# Patient Record
Sex: Female | Born: 1985 | Race: Black or African American | Hispanic: No | Marital: Married | Smoking: Never smoker
Health system: Southern US, Community
[De-identification: ages and names within clinical notes are randomized; demographics above are authoritative.]

## PROBLEM LIST (undated history)

## (undated) DIAGNOSIS — Z789 Other specified health status: Secondary | ICD-10-CM

## (undated) HISTORY — PX: NO PAST SURGERIES: SHX2092

## (undated) HISTORY — DX: Other specified health status: Z78.9

---

## 2013-04-10 NOTE — L&D Delivery Note (Signed)
Delivery Note At 3:39 AM a viable and healthy female was delivered via Vaginal, Spontaneous Delivery (Presentation: Right Occiput Anterior).  APGAR: 6, 9; weight: pending  .   Placenta status: Intact, Spontaneous.  Cord: 3 vessels with the following complications: None.  Cord pH: pending  Anesthesia: Epidural  Episiotomy: None Lacerations: 2nd degree Suture Repair: 3.0 vicryl Est. Blood Loss (mL): 350  Mom to postpartum.  Baby to Couplet care / Skin to Skin.  Pt pushed with good maternal effort to deliver a liveborn female via NSVD with spontaneous cry.   Loose nuchal x1 reduced prior to delivery.  Baby placed on maternal abdomen.  Cord clamped and cut immediately by provider.  Infant taken to warmer for further assessment.  Placenta delivered intact with 3V cord via traction and pitocin.  2nd degree tear. No complications.  Baby returned to mother after assessment and suctioning.  Mom and baby to postpartum.   [name] 09/17/2013, 4:17 AM  I was present for this delivery agree with the above resident's note.  LEFTWICH-KIRBY, [place] Certified Nurse-Midwife

## 2013-07-03 ENCOUNTER — Encounter: Payer: Self-pay | Admitting: Obstetrics & Gynecology

## 2013-07-03 ENCOUNTER — Ambulatory Visit (INDEPENDENT_AMBULATORY_CARE_PROVIDER_SITE_OTHER): Payer: Self-pay | Admitting: Obstetrics & Gynecology

## 2013-07-03 VITALS — BP 114/70 | Temp 98.2°F | Ht 65.0 in | Wt 174.6 lb

## 2013-07-03 DIAGNOSIS — Z3400 Encounter for supervision of normal first pregnancy, unspecified trimester: Secondary | ICD-10-CM

## 2013-07-03 LAB — CBC
HCT: 33.6 % — ABNORMAL LOW (ref 36.0–46.0)
HEMOGLOBIN: 11.5 g/dL — AB (ref 12.0–15.0)
MCH: 29.9 pg (ref 26.0–34.0)
MCHC: 34.2 g/dL (ref 30.0–36.0)
MCV: 87.3 fL (ref 78.0–100.0)
Platelets: 220 10*3/uL (ref 150–400)
RBC: 3.85 MIL/uL — AB (ref 3.87–5.11)
RDW: 14.7 % (ref 11.5–15.5)
WBC: 7.5 10*3/uL (ref 4.0–10.5)

## 2013-07-03 LAB — POCT URINALYSIS DIP (DEVICE)
BILIRUBIN URINE: NEGATIVE
GLUCOSE, UA: NEGATIVE mg/dL
Ketones, ur: NEGATIVE mg/dL
Leukocytes, UA: NEGATIVE
NITRITE: NEGATIVE
Protein, ur: NEGATIVE mg/dL
Specific Gravity, Urine: 1.01 (ref 1.005–1.030)
Urobilinogen, UA: 0.2 mg/dL (ref 0.0–1.0)
pH: 6 (ref 5.0–8.0)

## 2013-07-03 LAB — HIV ANTIBODY (ROUTINE TESTING W REFLEX): HIV: NONREACTIVE

## 2013-07-03 LAB — OB RESULTS CONSOLE GC/CHLAMYDIA
CHLAMYDIA, DNA PROBE: NEGATIVE
GC PROBE AMP, GENITAL: NEGATIVE

## 2013-07-03 LAB — RPR

## 2013-07-03 MED ORDER — TETANUS-DIPHTH-ACELL PERTUSSIS 5-2.5-18.5 LF-MCG/0.5 IM SUSP: 0.5000 mL | Freq: Once | INTRAMUSCULAR | Status: DC

## 2013-07-03 NOTE — Progress Notes (Signed)
Nutrition note: 1st visit consult Pt has gained 31.6# @ [redacted]w[redacted]d, which is > expected. Pt reports eating 2 meals & 2 snacks/d. Pt is taking a PNV. Pt reports having some heartburn but no N/V. Pt received verbal & written education on general nutrition during pregnancy.  Discussed wt gain goals of 25-35# or 1#/wk. Pt agrees to continue taking PNV. Pt does not have [employer] but plans to apply. Pt plans to BF. F/u as needed Vladimir Faster, MS, RD, LDN, [facility]

## 2013-07-03 NOTE — Patient Instructions (Signed)
Third Trimester of Pregnancy The third trimester is from week 29 through week 42, months 7 through 9. The third trimester is a time when the fetus is growing rapidly. At the end of the ninth month, the fetus is about 20 inches in length and weighs 6 10 pounds.  BODY CHANGES Your body goes through many changes during pregnancy. The changes vary from woman to woman.   Your weight will continue to increase. You can expect to gain 25 35 pounds (11 16 kg) by the end of the pregnancy.  You may begin to get stretch marks on your hips, abdomen, and breasts.  You may urinate more often because the fetus is moving lower into your pelvis and pressing on your bladder.  You may develop or continue to have heartburn as a result of your pregnancy.  You may develop constipation because certain hormones are causing the muscles that push waste through your intestines to slow down.  You may develop hemorrhoids or swollen, bulging veins (varicose veins).  You may have pelvic pain because of the weight gain and pregnancy hormones relaxing your joints between the bones in your pelvis. Back aches may result from over exertion of the muscles supporting your posture.  Your breasts will continue to grow and be tender. A yellow discharge may leak from your breasts called colostrum.  Your belly button may stick out.  You may feel short of breath because of your expanding uterus.  You may notice the fetus "dropping," or moving lower in your abdomen.  You may have a bloody mucus discharge. This usually occurs a few days to a week before labor begins.  Your cervix becomes thin and soft (effaced) near your due date. WHAT TO EXPECT AT YOUR PRENATAL EXAMS  You will have prenatal exams every 2 weeks until week 36. Then, you will have weekly prenatal exams. During a routine prenatal visit:  You will be weighed to make sure you and the fetus are growing normally.  Your blood pressure is taken.  Your abdomen will be  measured to track your baby's growth.  The fetal heartbeat will be listened to.  Any test results from the previous visit will be discussed.  You may have a cervical check near your due date to see if you have effaced. At around 36 weeks, your caregiver will check your cervix. At the same time, your caregiver will also perform a test on the secretions of the vaginal tissue. This test is to determine if a type of bacteria, Group B streptococcus, is present. Your caregiver will explain this further. Your caregiver may ask you:  What your birth plan is.  How you are feeling.  If you are feeling the baby move.  If you have had any abnormal symptoms, such as leaking fluid, bleeding, severe headaches, or abdominal cramping.  If you have any questions. Other tests or screenings that may be performed during your third trimester include:  Blood tests that check for low iron levels (anemia).  Fetal testing to check the health, activity level, and growth of the fetus. Testing is done if you have certain medical conditions or if there are problems during the pregnancy. FALSE LABOR You may feel small, irregular contractions that eventually go away. These are called Braxton Hicks contractions, or false labor. Contractions may last for hours, days, or even weeks before true labor sets in. If contractions come at regular intervals, intensify, or become painful, it is best to be seen by your caregiver.  SIGNS OF LABOR   Menstrual-like cramps.  Contractions that are 5 minutes apart or less.  Contractions that start on the top of the uterus and spread down to the lower abdomen and back.  A sense of increased pelvic pressure or back pain.  A watery or bloody mucus discharge that comes from the vagina. If you have any of these signs before the 37th week of pregnancy, call your caregiver right away. You need to go to the hospital to get checked immediately. HOME CARE INSTRUCTIONS   Avoid all  smoking, herbs, alcohol, and unprescribed drugs. These chemicals affect the formation and growth of the baby.  Follow your caregiver's instructions regarding medicine use. There are medicines that are either safe or unsafe to take during pregnancy.  Exercise only as directed by your caregiver. Experiencing uterine cramps is a good sign to stop exercising.  Continue to eat regular, healthy meals.  Wear a good support bra for breast tenderness.  Do not use hot tubs, steam rooms, or saunas.  Wear your seat belt at all times when driving.  Avoid raw meat, uncooked cheese, cat litter boxes, and soil used by cats. These carry germs that can cause birth defects in the baby.  Take your prenatal vitamins.  Try taking a stool softener (if your caregiver approves) if you develop constipation. Eat more high-fiber foods, such as fresh vegetables or fruit and whole grains. Drink plenty of fluids to keep your urine clear or pale yellow.  Take warm sitz baths to soothe any pain or discomfort caused by hemorrhoids. Use hemorrhoid cream if your caregiver approves.  If you develop varicose veins, wear support hose. Elevate your feet for 15 minutes, 3 4 times a day. Limit salt in your diet.  Avoid heavy lifting, wear low heal shoes, and practice good posture.  Rest a lot with your legs elevated if you have leg cramps or low back pain.  Visit your dentist if you have not gone during your pregnancy. Use a soft toothbrush to brush your teeth and be gentle when you floss.  A sexual relationship may be continued unless your caregiver directs you otherwise.  Do not travel far distances unless it is absolutely necessary and only with the approval of your caregiver.  Take prenatal classes to understand, practice, and ask questions about the labor and delivery.  Make a trial run to the hospital.  Pack your hospital bag.  Prepare the baby's nursery.  Continue to go to all your prenatal visits as directed  by your caregiver. SEEK MEDICAL CARE IF:  You are unsure if you are in labor or if your water has broken.  You have dizziness.  You have mild pelvic cramps, pelvic pressure, or nagging pain in your abdominal area.  You have persistent nausea, vomiting, or diarrhea.  You have a bad smelling vaginal discharge.  You have pain with urination. SEEK IMMEDIATE MEDICAL CARE IF:   You have a fever.  You are leaking fluid from your vagina.  You have spotting or bleeding from your vagina.  You have severe abdominal cramping or pain.  You have rapid weight loss or gain.  You have shortness of breath with chest pain.  You notice sudden or extreme swelling of your face, hands, ankles, feet, or legs.  You have not felt your baby move in over an hour.  You have severe headaches that do not go away with medicine.  You have vision changes. Document Released: 03/21/2001 Document Revised: 11/27/2012 Document Reviewed:   You have severe abdominal cramping or pain.   You have rapid weight loss or gain.   You have shortness of breath with chest pain.   You notice sudden or extreme swelling of your face, hands, ankles, feet, or legs.   You have not felt your baby move in over an hour.   You have severe headaches that do not go away with medicine.   You have vision changes.  Document Released: 03/21/2001 Document Revised: 11/27/2012 Document Reviewed: 05/28/2012  [employer] Patient Information 2014 [employer].

## 2013-07-03 NOTE — Progress Notes (Signed)
Pulse: 89

## 2013-07-03 NOTE — Progress Notes (Signed)
Transfer care from [place], initial visit and labs today  Subjective: new OB transfer from [place]    Debra Bernard is a G1P0000 [redacted]w[redacted]d being seen today for her first obstetrical visit.  Her obstetrical history is significant for previous care in [place]. Patient does intend to breast feed. Pregnancy history fully reviewed.  Patient reports no bleeding and no contractions.  Filed Vitals:   07/03/13 0803 07/03/13 0806  BP: 114/70   Temp: 98.2 F (36.8 C)   Height:  5\' 5"  (1.651 m)  Weight: 174 lb 9.6 oz (79.198 kg)     HISTORY: OB History  Gravida Para Term Preterm AB SAB TAB Ectopic Multiple Living  1 0 0 0 0 0 0 0 0 0     # Outcome Date GA Lbr Len/2nd Weight Sex Delivery Anes PTL Lv  1 CUR              History reviewed. No pertinent past medical history. History reviewed. No pertinent past surgical history. History reviewed. No pertinent family history.   Exam    Uterus:     Pelvic Exam:    Perineum: Hemorrhoids   Vulva: normal   Vagina:  normal mucosa   pH:     Cervix: no lesions and scant blood following pap   Adnexa: no mass, fullness, tenderness   Bony Pelvis: average  System: Breast:  normal appearance, no masses or tenderness   Skin: normal coloration and turgor, no rashes    Neurologic: oriented, normal mood   Extremities: normal strength, tone, and muscle mass, deformities: 4th toes each foot   HEENT PERRLA, neck supple with midline trachea and thyroid without masses   Mouth/Teeth mucous membranes moist, pharynx normal without lesions and dental hygiene good   Neck supple   Cardiovascular: regular rate and rhythm, no murmurs or gallops   Respiratory:  appears well, vitals normal, no respiratory distress, acyanotic, normal RR, neck free of mass or lymphadenopathy, chest clear, no wheezing, crepitations, rhonchi, normal symmetric air entry   Abdomen: soft, non-tender; bowel sounds normal; no masses,  no organomegaly   Urinary: urethral meatus normal       Assessment:    Pregnancy: G1P0000 Patient Active Problem List   Diagnosis Date Noted  . Supervision of low-risk first pregnancy 07/03/2013        Plan:     Initial labs drawn. Prenatal vitamins. Problem list reviewed and updated. Genetic Screening discussed not done in [place]  Ultrasound discussed; fetal survey: not ordered.  Follow up in 2 weeks. 50% of 30 min visit spent on counseling and coordination of care.  Labs, pap   Debra Bernard 07/03/2013

## 2013-07-04 LAB — GLUCOSE TOLERANCE, 1 HOUR (50G) W/O FASTING: Glucose, 1 Hour GTT: 154 mg/dL — ABNORMAL HIGH (ref 70–140)

## 2013-07-05 LAB — CULTURE, OB URINE: Colony Count: >=100000

## 2013-07-08 ENCOUNTER — Other Ambulatory Visit: Payer: Self-pay

## 2013-07-08 DIAGNOSIS — E349 Endocrine disorder, unspecified: Secondary | ICD-10-CM

## 2013-07-08 LAB — GLUCOSE TOLERANCE, 3 HOURS
Glucose Tolerance, 1 hour: 137 mg/dL (ref 70–189)
Glucose Tolerance, 2 hour: 115 mg/dL (ref 70–164)
Glucose Tolerance, Fasting: 82 mg/dL (ref 70–104)
Glucose, GTT - 3 Hour: 92 mg/dL (ref 70–144)

## 2013-07-11 NOTE — Progress Notes (Signed)
CSW received message to call patient.  CSW left message asking patient to return CSW's call.

## 2013-07-11 NOTE — Progress Notes (Signed)
Patient returned CSW's phone call and stated she had questions about Medicaid.  CSW explained that she will have to apply at [facility].  She states she applied 2 weeks ago with [employer] Development worker, community.  CSW suggested that she contact [employer] with questions.  She thanked CSW.

## 2013-07-17 ENCOUNTER — Ambulatory Visit (INDEPENDENT_AMBULATORY_CARE_PROVIDER_SITE_OTHER): Payer: Self-pay | Admitting: Family

## 2013-07-17 VITALS — BP 107/74 | Temp 99.3°F | Wt 171.4 lb

## 2013-07-17 DIAGNOSIS — Z3400 Encounter for supervision of normal first pregnancy, unspecified trimester: Secondary | ICD-10-CM

## 2013-07-17 LAB — POCT URINALYSIS DIP (DEVICE)
BILIRUBIN URINE: NEGATIVE
GLUCOSE, UA: NEGATIVE mg/dL
Hgb urine dipstick: NEGATIVE
Ketones, ur: NEGATIVE mg/dL
LEUKOCYTES UA: NEGATIVE
Nitrite: NEGATIVE
Protein, ur: 30 mg/dL — AB
Specific Gravity, Urine: 1.015 (ref 1.005–1.030)
Urobilinogen, UA: 0.2 mg/dL (ref 0.0–1.0)
pH: 8.5 — ABNORMAL HIGH (ref 5.0–8.0)

## 2013-07-17 LAB — OB RESULTS CONSOLE RUBELLA ANTIBODY, IGM: RUBELLA: NON-IMMUNE/NOT IMMUNE

## 2013-07-17 MED ORDER — PRENATAL VITAMINS PLUS 27-1 MG PO TABS: 1.0000 | ORAL_TABLET | Freq: Every day | ORAL | Status: DC

## 2013-07-17 NOTE — Progress Notes (Signed)
P= 104 C/o of intermittent cramping.  Not all initial labs were drawn; will need blood typing/rh today, rubella, hep B  Needs anatomy ultrasound scheduled.

## 2013-07-17 NOTE — Progress Notes (Signed)
U/S scheduled 07/24/13 at 730 am.

## 2013-07-17 NOTE — Progress Notes (Signed)
Cramping once a day, no bleeding or leaking. Reviewed 3 hr results - nml.  Schedule anatomy ultrasound.  Obtain needed blood work - type/rh, rubella, and hep B.

## 2013-07-18 LAB — ABO AND RH: RH TYPE: POSITIVE

## 2013-07-18 LAB — HEPATITIS B SURFACE ANTIGEN: HEP B S AG: NEGATIVE

## 2013-07-22 LAB — RUBELLA ANTIBODY, IGM: Rubella IgM: 0.43 (ref <0.90)

## 2013-07-24 ENCOUNTER — Ambulatory Visit (HOSPITAL_COMMUNITY)
Admission: RE | Admit: 2013-07-24 | Discharge: 2013-07-24 | Disposition: A | Discharge status: Home / Self Care | Payer: Self-pay | Source: Ambulatory Visit | Attending: Family | Admitting: Family

## 2013-07-24 DIAGNOSIS — Z3689 Encounter for other specified antenatal screening: Secondary | ICD-10-CM | POA: Insufficient documentation

## 2013-07-24 DIAGNOSIS — Z3400 Encounter for supervision of normal first pregnancy, unspecified trimester: Secondary | ICD-10-CM

## 2013-07-24 DIAGNOSIS — O0930 Supervision of pregnancy with insufficient antenatal care, unspecified trimester: Secondary | ICD-10-CM | POA: Insufficient documentation

## 2013-07-28 ENCOUNTER — Encounter: Payer: Self-pay | Admitting: Family

## 2013-07-30 ENCOUNTER — Ambulatory Visit (INDEPENDENT_AMBULATORY_CARE_PROVIDER_SITE_OTHER): Payer: Self-pay | Admitting: Obstetrics & Gynecology

## 2013-07-30 VITALS — BP 124/73 | HR 96 | Temp 99.0°F | Wt 176.6 lb

## 2013-07-30 DIAGNOSIS — Z3400 Encounter for supervision of normal first pregnancy, unspecified trimester: Secondary | ICD-10-CM

## 2013-07-30 LAB — POCT URINALYSIS DIP (DEVICE)
BILIRUBIN URINE: NEGATIVE
Glucose, UA: NEGATIVE mg/dL
HGB URINE DIPSTICK: NEGATIVE
Ketones, ur: NEGATIVE mg/dL
LEUKOCYTES UA: NEGATIVE
NITRITE: NEGATIVE
Protein, ur: NEGATIVE mg/dL
Specific Gravity, Urine: 1.025 (ref 1.005–1.030)
UROBILINOGEN UA: 0.2 mg/dL (ref 0.0–1.0)
pH: 6 (ref 5.0–8.0)

## 2013-07-30 NOTE — Progress Notes (Signed)
Patient reports headache for past 2 days and occasional dizziness; also reports cramping in abdomen and back

## 2013-07-30 NOTE — Progress Notes (Signed)
Routine visit. TDAP ordered. Good FM. Cultures at next visit.

## 2013-07-30 NOTE — Progress Notes (Signed)
I have recommended Claritin and Tylenol for what sounds like sinus headaches. I told her to come back in 1 week instead of 2 if she feels no better by then.

## 2013-08-12 ENCOUNTER — Encounter: Payer: Self-pay | Admitting: Obstetrics & Gynecology

## 2013-08-12 ENCOUNTER — Ambulatory Visit (INDEPENDENT_AMBULATORY_CARE_PROVIDER_SITE_OTHER): Payer: Self-pay | Admitting: Obstetrics & Gynecology

## 2013-08-12 VITALS — BP 116/72 | HR 90 | Temp 98.9°F | Wt 176.3 lb

## 2013-08-12 DIAGNOSIS — Z3400 Encounter for supervision of normal first pregnancy, unspecified trimester: Secondary | ICD-10-CM

## 2013-08-12 LAB — POCT URINALYSIS DIP (DEVICE)
BILIRUBIN URINE: NEGATIVE
GLUCOSE, UA: NEGATIVE mg/dL
Ketones, ur: NEGATIVE mg/dL
Leukocytes, UA: NEGATIVE
NITRITE: NEGATIVE
Protein, ur: NEGATIVE mg/dL
Specific Gravity, Urine: 1.025 (ref 1.005–1.030)
UROBILINOGEN UA: 0.2 mg/dL (ref 0.0–1.0)
pH: 5.5 (ref 5.0–8.0)

## 2013-08-12 NOTE — Progress Notes (Signed)
Routine visit. Good FM. No problems. Cervical cultures. She says that she was given TDAP at last visit and it was ordered but it is not documented. Labor precautions reviewed.

## 2013-08-12 NOTE — Progress Notes (Signed)
Intermittent mild pelvic pressure and contractions. Pt. Reports getting Tdap vaccine at last visit; states she received it in the Left arm. No documentation that it was given.

## 2013-08-13 LAB — GC/CHLAMYDIA PROBE AMP
CT Probe RNA: NEGATIVE
GC PROBE AMP APTIMA: NEGATIVE

## 2013-08-16 LAB — CULTURE, STREPTOCOCCUS GRP B W/SUSCEPT

## 2013-08-18 ENCOUNTER — Encounter: Payer: Self-pay | Admitting: Obstetrics & Gynecology

## 2013-08-19 ENCOUNTER — Ambulatory Visit (INDEPENDENT_AMBULATORY_CARE_PROVIDER_SITE_OTHER): Payer: Self-pay | Admitting: Obstetrics & Gynecology

## 2013-08-19 VITALS — BP 117/76 | HR 97 | Temp 99.5°F | Wt 178.5 lb

## 2013-08-19 DIAGNOSIS — O99820 Streptococcus B carrier state complicating pregnancy: Secondary | ICD-10-CM | POA: Insufficient documentation

## 2013-08-19 DIAGNOSIS — Z22330 Carrier of Group B streptococcus: Secondary | ICD-10-CM

## 2013-08-19 DIAGNOSIS — O09899 Supervision of other high risk pregnancies, unspecified trimester: Secondary | ICD-10-CM

## 2013-08-19 LAB — POCT URINALYSIS DIP (DEVICE)
Bilirubin Urine: NEGATIVE
GLUCOSE, UA: NEGATIVE mg/dL
Hgb urine dipstick: NEGATIVE
Ketones, ur: NEGATIVE mg/dL
NITRITE: NEGATIVE
PROTEIN: NEGATIVE mg/dL
Specific Gravity, Urine: 1.015 (ref 1.005–1.030)
Urobilinogen, UA: 0.2 mg/dL (ref 0.0–1.0)
pH: 7 (ref 5.0–8.0)

## 2013-08-19 LAB — OB RESULTS CONSOLE GBS: STREP GROUP B AG: POSITIVE

## 2013-08-19 NOTE — Progress Notes (Signed)
Routine visit. Good FM. No problems. We discussed +GBS infection and treatment in labor.

## 2013-08-23 ENCOUNTER — Encounter: Payer: Self-pay | Admitting: *Deleted

## 2013-08-26 ENCOUNTER — Encounter: Payer: Self-pay | Admitting: Advanced Practice Midwife

## 2013-08-26 ENCOUNTER — Ambulatory Visit (INDEPENDENT_AMBULATORY_CARE_PROVIDER_SITE_OTHER): Payer: Self-pay | Admitting: Advanced Practice Midwife

## 2013-08-26 VITALS — BP 113/77 | HR 102 | Temp 98.8°F | Wt 179.0 lb

## 2013-08-26 DIAGNOSIS — Z3400 Encounter for supervision of normal first pregnancy, unspecified trimester: Secondary | ICD-10-CM

## 2013-08-26 LAB — POCT URINALYSIS DIP (DEVICE)
BILIRUBIN URINE: NEGATIVE
Glucose, UA: NEGATIVE mg/dL
Hgb urine dipstick: NEGATIVE
Nitrite: NEGATIVE
PH: 7 (ref 5.0–8.0)
Protein, ur: 100 mg/dL — AB
Specific Gravity, Urine: 1.025 (ref 1.005–1.030)
UROBILINOGEN UA: 0.2 mg/dL (ref 0.0–1.0)

## 2013-08-26 NOTE — Progress Notes (Signed)
Doing well. Reviewed signs of labor

## 2013-08-26 NOTE — Patient Instructions (Signed)

## 2013-09-05 ENCOUNTER — Ambulatory Visit (INDEPENDENT_AMBULATORY_CARE_PROVIDER_SITE_OTHER): Payer: Self-pay | Admitting: Family

## 2013-09-05 VITALS — BP 109/72 | HR 98 | Temp 98.5°F | Wt 178.2 lb

## 2013-09-05 DIAGNOSIS — Z22330 Carrier of Group B streptococcus: Secondary | ICD-10-CM

## 2013-09-05 DIAGNOSIS — O09899 Supervision of other high risk pregnancies, unspecified trimester: Secondary | ICD-10-CM

## 2013-09-05 DIAGNOSIS — O99820 Streptococcus B carrier state complicating pregnancy: Secondary | ICD-10-CM

## 2013-09-05 DIAGNOSIS — Z3400 Encounter for supervision of normal first pregnancy, unspecified trimester: Secondary | ICD-10-CM

## 2013-09-05 LAB — POCT URINALYSIS DIP (DEVICE)
Bilirubin Urine: NEGATIVE
Glucose, UA: NEGATIVE mg/dL
KETONES UR: NEGATIVE mg/dL
LEUKOCYTES UA: NEGATIVE
NITRITE: NEGATIVE
PROTEIN: NEGATIVE mg/dL
Specific Gravity, Urine: 1.01 (ref 1.005–1.030)
UROBILINOGEN UA: 0.2 mg/dL (ref 0.0–1.0)
pH: 6 (ref 5.0–8.0)

## 2013-09-05 NOTE — Progress Notes (Signed)
Reports a few contractions.

## 2013-09-05 NOTE — Progress Notes (Signed)
Reports intermittent contractions.  Denies vaginal bleeding or leaking of fluid.  Reviewed IOL policy and postdates management.

## 2013-09-12 ENCOUNTER — Telehealth (HOSPITAL_COMMUNITY): Payer: Self-pay | Admitting: *Deleted

## 2013-09-12 ENCOUNTER — Ambulatory Visit (INDEPENDENT_AMBULATORY_CARE_PROVIDER_SITE_OTHER): Payer: Self-pay | Admitting: Advanced Practice Midwife

## 2013-09-12 ENCOUNTER — Encounter (HOSPITAL_COMMUNITY): Payer: Self-pay | Admitting: *Deleted

## 2013-09-12 VITALS — BP 111/75 | HR 88 | Temp 97.6°F | Wt 180.3 lb

## 2013-09-12 DIAGNOSIS — O99820 Streptococcus B carrier state complicating pregnancy: Secondary | ICD-10-CM

## 2013-09-12 DIAGNOSIS — O09899 Supervision of other high risk pregnancies, unspecified trimester: Secondary | ICD-10-CM

## 2013-09-12 DIAGNOSIS — O480 Post-term pregnancy: Secondary | ICD-10-CM

## 2013-09-12 DIAGNOSIS — Z3400 Encounter for supervision of normal first pregnancy, unspecified trimester: Secondary | ICD-10-CM

## 2013-09-12 DIAGNOSIS — Z22330 Carrier of Group B streptococcus: Secondary | ICD-10-CM

## 2013-09-12 LAB — POCT URINALYSIS DIP (DEVICE)
Bilirubin Urine: NEGATIVE
GLUCOSE, UA: NEGATIVE mg/dL
Ketones, ur: NEGATIVE mg/dL
LEUKOCYTES UA: NEGATIVE
NITRITE: NEGATIVE
PROTEIN: NEGATIVE mg/dL
SPECIFIC GRAVITY, URINE: 1.01 (ref 1.005–1.030)
UROBILINOGEN UA: 0.2 mg/dL (ref 0.0–1.0)
pH: 6 (ref 5.0–8.0)

## 2013-09-12 NOTE — Progress Notes (Signed)
Reports intermittent lower back pain and braxton hicks.

## 2013-09-12 NOTE — Telephone Encounter (Signed)
Preadmission screen  

## 2013-09-12 NOTE — Patient Instructions (Addendum)
Induction scheduled for Monday 09/15/2013 at 7:30 pm. Arrive at 7:15 in Maternity Admissions. Eat a light dinner.   Labor Induction  Labor induction is when steps are taken to cause a pregnant woman to begin the labor process. Most women go into labor on their own between 37 weeks and 42 weeks of the pregnancy. When this does not happen or when there is a medical need, methods may be used to induce labor. Labor induction causes a pregnant woman's uterus to contract. It also causes the cervix to soften (ripen), open (dilate), and thin out (efface). Usually, labor is not induced before 39 weeks of the pregnancy unless there is a problem with the baby or mother.  Before inducing labor, your health care provider will consider a number of factors, including the following:  The medical condition of you and the baby.   How many weeks along you are.   The status of the baby's lung maturity.   The condition of the cervix.   The position of the baby.  WHAT ARE THE REASONS FOR LABOR INDUCTION? Labor may be induced for the following reasons:  The health of the baby or mother is at risk.   The pregnancy is overdue by 1 week or more.   The water breaks but labor does not start on its own.   The mother has a health condition or serious illness, such as high blood pressure, infection, placental abruption, or diabetes.  The amniotic fluid amounts are low around the baby.   The baby is distressed.  Convenience or wanting the baby to be born on a certain date is not a reason for inducing labor. WHAT METHODS ARE USED FOR LABOR INDUCTION? Several methods of labor induction may be used, such as:   Prostaglandin medicine. This medicine causes the cervix to dilate and ripen. The medicine will also start contractions. It can be taken by mouth or by inserting a suppository into the vagina.   Inserting a thin tube (catheter) with a balloon on the end into the vagina to dilate the cervix. Once  inserted, the balloon is expanded with water, which causes the cervix to open.   Stripping the membranes. Your health care provider separates amniotic sac tissue from the cervix, causing the cervix to be stretched and causing the release of a hormone called progesterone. This may cause the uterus to contract. It is often done during an office visit. You will be sent home to wait for the contractions to begin. You will then come in for an induction.   Breaking the water. Your health care provider makes a hole in the amniotic sac using a small instrument. Once the amniotic sac breaks, contractions should begin. This may still take hours to see an effect.   Medicine to trigger or strengthen contractions. This medicine is given through an IV access tube inserted into a vein in your arm.  All of the methods of induction, besides stripping the membranes, will be done in the hospital. Induction is done in the hospital so that you and the baby can be carefully monitored.  HOW LONG DOES IT TAKE FOR LABOR TO BE INDUCED? Some inductions can take up to 2 3 days. Depending on the cervix, it usually takes less time. It takes longer when you are induced early in the pregnancy or if this is your first pregnancy. If a mother is still pregnant and the induction has been going on for 2 3 days, either the mother will be sent  home or a cesarean delivery will be needed. WHAT ARE THE RISKS ASSOCIATED WITH LABOR INDUCTION? Some of the risks of induction include:   Changes in fetal heart rate, such as too high, too low, or erratic.   Fetal distress.   Chance of infection for the mother and baby.   Increased chance of having a cesarean delivery.   Breaking off (abruption) of the placenta from the uterus (rare).   Uterine rupture (very rare).  When induction is needed for medical reasons, the benefits of induction may outweigh the risks. WHAT ARE SOME REASONS FOR NOT INDUCING LABOR? Labor induction should  not be done if:   It is shown that your baby does not tolerate labor.   You have had previous surgeries on your uterus, such as a myomectomy or the removal of fibroids.   Your placenta lies very low in the uterus and blocks the opening of the cervix (placenta previa).   Your baby is not in a head-down position.   The umbilical cord drops down into the birth canal in front of the baby. This could cut off the baby's blood and oxygen supply.   You have had a previous cesarean delivery.   There are unusual circumstances, such as the baby being extremely premature.  Document Released: 08/16/2006 Document Revised: 11/27/2012 Document Reviewed: 10/24/2012 [facility] Patient Information 2014 [place], [place].  Fetal Movement Counts Patient Name: __________________________________________________ Patient Due Date: ____________________ Performing a fetal movement count is highly recommended in high-risk pregnancies, but it is good for every pregnant woman to do. Your caregiver may ask you to start counting fetal movements at 28 weeks of the pregnancy. Fetal movements often increase:  After eating a full meal.  After physical activity.  After eating or drinking something sweet or cold.  At rest. Pay attention to when you feel the baby is most active. This will help you notice a pattern of your baby's sleep and wake cycles and what factors contribute to an increase in fetal movement. It is important to perform a fetal movement count at the same time each day when your baby is normally most active.  HOW TO COUNT FETAL MOVEMENTS 1. Find a quiet and comfortable area to sit or lie down on your left side. Lying on your left side provides the best blood and oxygen circulation to your baby. 2. Write down the day and time on a sheet of paper or in a journal. 3. Start counting kicks, flutters, swishes, rolls, or jabs in a 2 hour period. You should feel at least 10 movements within 2 hours. 4. If you  do not feel 10 movements in 2 hours, wait 2 3 hours and count again. Look for a change in the pattern or not enough counts in 2 hours. SEEK MEDICAL CARE IF:  You feel less than 10 counts in 2 hours, tried twice.  There is no movement in over an hour.  The pattern is changing or taking longer each day to reach 10 counts in 2 hours.  You feel the baby is not moving as he or she usually does. Date: ____________ Movements: ____________ Start time: ____________ Elizebeth Koller time: ____________  Date: ____________ Movements: ____________ Start time: ____________ Elizebeth Koller time: ____________ Date: ____________ Movements: ____________ Start time: ____________ Elizebeth Koller time: ____________ Date: ____________ Movements: ____________ Start time: ____________ Elizebeth Koller time: ____________ Date: ____________ Movements: ____________ Start time: ____________ Elizebeth Koller time: ____________ Date: ____________ Movements: ____________ Start time: ____________ Elizebeth Koller time: ____________ Date: ____________ Movements: ____________ Start time: ____________  Finish time: ____________ Date: ____________ Movements: ____________ Start time: ____________ Elizebeth Koller time: ____________  Date: ____________ Movements: ____________ Start time: ____________ Elizebeth Koller time: ____________ Date: ____________ Movements: ____________ Start time: ____________ Elizebeth Koller time: ____________ Date: ____________ Movements: ____________ Start time: ____________ Elizebeth Koller time: ____________ Date: ____________ Movements: ____________ Start time: ____________ Elizebeth Koller time: ____________ Date: ____________ Movements: ____________ Start time: ____________ Elizebeth Koller time: ____________ Date: ____________ Movements: ____________ Start time: ____________ Elizebeth Koller time: ____________ Date: ____________ Movements: ____________ Start time: ____________ Elizebeth Koller time: ____________  Date: ____________ Movements: ____________ Start time: ____________ Elizebeth Koller time: ____________ Date: ____________  Movements: ____________ Start time: ____________ Elizebeth Koller time: ____________ Date: ____________ Movements: ____________ Start time: ____________ Elizebeth Koller time: ____________ Date: ____________ Movements: ____________ Start time: ____________ Elizebeth Koller time: ____________ Date: ____________ Movements: ____________ Start time: ____________ Elizebeth Koller time: ____________ Date: ____________ Movements: ____________ Start time: ____________ Elizebeth Koller time: ____________ Date: ____________ Movements: ____________ Start time: ____________ Elizebeth Koller time: ____________  Date: ____________ Movements: ____________ Start time: ____________ Elizebeth Koller time: ____________ Date: ____________ Movements: ____________ Start time: ____________ Elizebeth Koller time: ____________ Date: ____________ Movements: ____________ Start time: ____________ Elizebeth Koller time: ____________ Date: ____________ Movements: ____________ Start time: ____________ Elizebeth Koller time: ____________ Date: ____________ Movements: ____________ Start time: ____________ Elizebeth Koller time: ____________ Date: ____________ Movements: ____________ Start time: ____________ Elizebeth Koller time: ____________ Date: ____________ Movements: ____________ Start time: ____________ Elizebeth Koller time: ____________  Date: ____________ Movements: ____________ Start time: ____________ Elizebeth Koller time: ____________ Date: ____________ Movements: ____________ Start time: ____________ Elizebeth Koller time: ____________ Date: ____________ Movements: ____________ Start time: ____________ Elizebeth Koller time: ____________ Date: ____________ Movements: ____________ Start time: ____________ Elizebeth Koller time: ____________ Date: ____________ Movements: ____________ Start time: ____________ Elizebeth Koller time: ____________ Date: ____________ Movements: ____________ Start time: ____________ Elizebeth Koller time: ____________ Date: ____________ Movements: ____________ Start time: ____________ Elizebeth Koller time: ____________  Date: ____________ Movements: ____________ Start time:  ____________ Elizebeth Koller time: ____________ Date: ____________ Movements: ____________ Start time: ____________ Elizebeth Koller time: ____________ Date: ____________ Movements: ____________ Start time: ____________ Elizebeth Koller time: ____________ Date: ____________ Movements: ____________ Start time: ____________ Elizebeth Koller time: ____________ Date: ____________ Movements: ____________ Start time: ____________ Elizebeth Koller time: ____________ Date: ____________ Movements: ____________ Start time: ____________ Elizebeth Koller time: ____________ Date: ____________ Movements: ____________ Start time: ____________ Elizebeth Koller time: ____________  Date: ____________ Movements: ____________ Start time: ____________ Elizebeth Koller time: ____________ Date: ____________ Movements: ____________ Start time: ____________ Elizebeth Koller time: ____________ Date: ____________ Movements: ____________ Start time: ____________ Elizebeth Koller time: ____________ Date: ____________ Movements: ____________ Start time: ____________ Elizebeth Koller time: ____________ Date: ____________ Movements: ____________ Start time: ____________ Elizebeth Koller time: ____________ Date: ____________ Movements: ____________ Start time: ____________ Elizebeth Koller time: ____________ Date: ____________ Movements: ____________ Start time: ____________ Elizebeth Koller time: ____________  Date: ____________ Movements: ____________ Start time: ____________ Elizebeth Koller time: ____________ Date: ____________ Movements: ____________ Start time: ____________ Elizebeth Koller time: ____________ Date: ____________ Movements: ____________ Start time: ____________ Elizebeth Koller time: ____________ Date: ____________ Movements: ____________ Start time: ____________ Elizebeth Koller time: ____________ Date: ____________ Movements: ____________ Start time: ____________ Elizebeth Koller time: ____________ Date: ____________ Movements: ____________ Start time: ____________ Elizebeth Koller time: ____________ Document Released: 04/26/2006 Document Revised: 03/13/2012 Document Reviewed: 01/22/2012 [employer]  Patient Information 2014 [employer].

## 2013-09-12 NOTE — Progress Notes (Signed)
NST reactive today and IOL in 3 days.

## 2013-09-15 ENCOUNTER — Encounter (HOSPITAL_COMMUNITY): Payer: Self-pay

## 2013-09-15 ENCOUNTER — Inpatient Hospital Stay (HOSPITAL_COMMUNITY)
Admission: RE | Admit: 2013-09-15 | Discharge: 2013-09-19 | DRG: 775 | Disposition: A | Discharge status: Home / Self Care | Payer: Medicaid Other | Source: Ambulatory Visit | Attending: Family Medicine | Admitting: Family Medicine

## 2013-09-15 VITALS — BP 111/73 | HR 81 | Temp 97.7°F | Resp 17 | Ht 65.0 in | Wt 180.0 lb

## 2013-09-15 DIAGNOSIS — O480 Post-term pregnancy: Secondary | ICD-10-CM | POA: Diagnosis present

## 2013-09-15 DIAGNOSIS — Z22330 Carrier of Group B streptococcus: Secondary | ICD-10-CM

## 2013-09-15 DIAGNOSIS — O411090 Infection of amniotic sac and membranes, unspecified, unspecified trimester, not applicable or unspecified: Secondary | ICD-10-CM | POA: Diagnosis present

## 2013-09-15 DIAGNOSIS — O99892 Other specified diseases and conditions complicating childbirth: Secondary | ICD-10-CM | POA: Diagnosis present

## 2013-09-15 DIAGNOSIS — O9989 Other specified diseases and conditions complicating pregnancy, childbirth and the puerperium: Secondary | ICD-10-CM

## 2013-09-15 DIAGNOSIS — O99820 Streptococcus B carrier state complicating pregnancy: Secondary | ICD-10-CM

## 2013-09-15 LAB — CBC
HCT: 36.1 % (ref 36.0–46.0)
Hemoglobin: 12.4 g/dL (ref 12.0–15.0)
MCH: 30.8 pg (ref 26.0–34.0)
MCHC: 34.3 g/dL (ref 30.0–36.0)
MCV: 89.8 fL (ref 78.0–100.0)
PLATELETS: 179 10*3/uL (ref 150–400)
RBC: 4.02 MIL/uL (ref 3.87–5.11)
RDW: 14.5 % (ref 11.5–15.5)
WBC: 7.9 10*3/uL (ref 4.0–10.5)

## 2013-09-15 MED ORDER — IBUPROFEN 600 MG PO TABS
600.0000 mg | ORAL_TABLET | Freq: Four times a day (QID) | ORAL | Status: DC | PRN
Start: 2013-09-15 — End: 2013-09-17

## 2013-09-15 MED ORDER — CITRIC ACID-SODIUM CITRATE 334-500 MG/5ML PO SOLN
30.0000 mL | ORAL | Status: DC | PRN
  Filled 2013-09-15: qty 15

## 2013-09-15 MED ORDER — PENICILLIN G POTASSIUM 5000000 UNITS IJ SOLR
2.5000 10*6.[IU] | INTRAVENOUS | Status: DC
  Filled 2013-09-15 (×2): qty 2.5

## 2013-09-15 MED ORDER — ONDANSETRON HCL 4 MG/2ML IJ SOLN
4.0000 mg | Freq: Four times a day (QID) | INTRAMUSCULAR | Status: DC | PRN
  Administered 2013-09-16: 4 mg via INTRAVENOUS
  Filled 2013-09-15: qty 2

## 2013-09-15 MED ORDER — LACTATED RINGERS IV SOLN
INTRAVENOUS | Status: DC
  Administered 2013-09-16: 125 mL/h via INTRAVENOUS
  Administered 2013-09-16 (×2): via INTRAVENOUS

## 2013-09-15 MED ORDER — LIDOCAINE HCL (PF) 1 % IJ SOLN
30.0000 mL | INTRAMUSCULAR | Status: DC | PRN
  Filled 2013-09-15: qty 30

## 2013-09-15 MED ORDER — PENICILLIN G POTASSIUM 5000000 UNITS IJ SOLR: 2.5000 10*6.[IU] | INTRAMUSCULAR | Status: DC

## 2013-09-15 MED ORDER — OXYTOCIN BOLUS FROM INFUSION: 500.0000 mL | INTRAVENOUS | Status: DC

## 2013-09-15 MED ORDER — OXYTOCIN 40 UNITS IN LACTATED RINGERS INFUSION - SIMPLE MED: 62.5000 mL/h | INTRAVENOUS | Status: DC

## 2013-09-15 MED ORDER — TERBUTALINE SULFATE 1 MG/ML IJ SOLN: 0.2500 mg | Freq: Once | INTRAMUSCULAR | Status: AC | PRN

## 2013-09-15 MED ORDER — PENICILLIN G POTASSIUM 5000000 UNITS IJ SOLR: 5.0000 10*6.[IU] | Freq: Once | INTRAVENOUS | Status: DC

## 2013-09-15 MED ORDER — LACTATED RINGERS IV SOLN: 500.0000 mL | INTRAVENOUS | Status: DC | PRN

## 2013-09-15 MED ORDER — FLEET ENEMA 7-19 GM/118ML RE ENEM: 1.0000 | ENEMA | RECTAL | Status: DC | PRN

## 2013-09-15 MED ORDER — ACETAMINOPHEN 325 MG PO TABS
650.0000 mg | ORAL_TABLET | ORAL | Status: DC | PRN
  Administered 2013-09-16: 650 mg via ORAL
  Filled 2013-09-15: qty 2

## 2013-09-15 MED ORDER — PENICILLIN G POTASSIUM 5000000 UNITS IJ SOLR
5.0000 10*6.[IU] | Freq: Once | INTRAVENOUS | Status: DC
  Filled 2013-09-15: qty 5

## 2013-09-15 MED ORDER — OXYCODONE-ACETAMINOPHEN 5-325 MG PO TABS: 1.0000 | ORAL_TABLET | ORAL | Status: DC | PRN

## 2013-09-15 MED ORDER — MISOPROSTOL 25 MCG QUARTER TABLET
25.0000 ug | ORAL_TABLET | ORAL | Status: DC | PRN
  Administered 2013-09-15 – 2013-09-16 (×2): 25 ug via VAGINAL
  Filled 2013-09-15 (×2): qty 0.25
  Filled 2013-09-15: qty 1

## 2013-09-15 NOTE — H&P (Signed)
LABOR ADMISSION HISTORY AND PHYSICAL  Huntley Demedeiros is a 28 y.o. female G1P0000 with IUP at [redacted]w[redacted]d presenting for IOL for post dates.    She denies any leakage of fluid or vaginal bleeding. Pregnancy has been uncomplicated. EDC confirmed by 4/16 [place].   [facility] at [facility] since 30 wks. Prior [place] in [place], [place].   Prenatal History/Complications:  Past Medical History: Past Medical History  Diagnosis Date  . Medical history non-contributory     Past Surgical History: Past Surgical History  Procedure Laterality Date  . No past surgeries      Obstetrical History: OB History   Grav Para Term Preterm Abortions TAB SAB Ect Mult Living   1 0 0 0 0 0 0 0 0 0       Social History: History   Social History  . Marital Status: Married    Spouse Name: N/A    Number of Children: N/A  . Years of Education: N/A   Social History Main Topics  . Smoking status: Never Smoker   . Smokeless tobacco: Never Used  . Alcohol Use: No  . Drug Use: No  . Sexual Activity: Not Currently   Other Topics Concern  . None   Social History Narrative  . None    Family History: Family History  Problem Relation Age of Onset  . Alcohol abuse Neg Hx   . Arthritis Neg Hx   . Asthma Neg Hx   . Birth defects Neg Hx   . Cancer Neg Hx   . COPD Neg Hx   . Depression Neg Hx   . Diabetes Neg Hx   . Drug abuse Neg Hx   . Early death Neg Hx   . Hearing loss Neg Hx   . Hyperlipidemia Neg Hx   . Heart disease Neg Hx   . Hypertension Neg Hx   . Kidney disease Neg Hx   . Learning disabilities Neg Hx   . Mental illness Neg Hx   . Mental retardation Neg Hx   . Miscarriages / Stillbirths Neg Hx   . Stroke Neg Hx   . Vision loss Neg Hx     Allergies: No Known Allergies  Prescriptions prior to admission  Medication Sig Dispense Refill  . Prenatal Vit-Fe Fumarate-FA (PRENATAL VITAMINS PLUS) 27-1 MG TABS Take 1 tablet by mouth daily.  30 tablet  5     Review of Systems   All systems reviewed and  negative except as stated in HPI  Blood pressure 128/78, pulse 93, temperature 98.2 F (36.8 C), temperature source Oral, height 5\' 5"  (1.651 m), weight 81.647 kg (180 lb), last menstrual period 12/02/2012. General appearance: alert, cooperative and no distress Abdomen: soft, non-tender; bowel sounds normal Extremities: Homans sign is negative, no sign of DVT Presentation: cephalic Fetal monitoringBaseline: 155 bpm, Variability: Good {> 6 bpm), Accelerations: Reactive and Decelerations: Absent Uterine activityFrequency: Every 2.5-7.5 minutes Dilation: Fingertip Effacement (%): Thick Station: -2;-3 Exam by:: Dr. Thera Flake, RNC   Prenatal labs: ABO, Rh: O/POS/-- (04/09 1102) Antibody:   Rubella: 0.43 (04/09 1102) RPR: NON REAC (03/26 0915)  HBsAg: NEGATIVE (04/09 1102)  HIV: NON REACTIVE (03/26 0915)  GBS: Positive (05/12 0000)  1 hr Glucola 154 3 hr nml ([id number])  Genetic screening  none Anatomy US normal    Prenatal Transfer Tool  Maternal Diabetes: No Genetic Screening: none Maternal Ultrasounds/Referrals: Normal Fetal Ultrasounds or other Referrals:  None Maternal Substance Abuse:  No Significant Maternal Medications:  None  Significant Maternal Lab Results: Lab values include: Group B Strep positive  Had PNC in [place], limited records.  Clinic  [facility]   Dating  Sure lmp, consistent with 33 wk ultrasound   Genetic Screen  1 Screen: no AFP: no Quad: no NIPS:no   Anatomic [place]  normal in [place] per patient; Nml at 33 wks (Women's)   GTT  Early: Third trimester: 154 3 hr nml ([id number])   TDaP vaccine  Pt. States she received it in the left arm on 07/30/13 though administration not documented   Flu vaccine  Not offered per pt   GBS  positive   Contraception  Nexplanon   Baby Food  breast   Circumcision  Desires outpatient circ   Pediatrician  Undecided   Support Person  FOB sister       Results for orders placed during the hospital encounter of  09/15/13 (from the past 24 hour(s))  CBC   Collection Time    09/15/13  8:00 PM      Result Value Ref Range   WBC 7.9  4.0 - 10.5 K/uL   RBC 4.02  3.87 - 5.11 MIL/uL   Hemoglobin 12.4  12.0 - 15.0 g/dL   HCT 36.1  36.0 - 46.0 %   MCV 89.8  78.0 - 100.0 fL   MCH 30.8  26.0 - 34.0 pg   MCHC 34.3  30.0 - 36.0 g/dL   RDW 14.5  11.5 - 15.5 %   Platelets 179  150 - 400 K/uL    Assessment: Natassja Ollis is a 28 y.o. G1P0000 at [redacted]w[redacted]d here for IOL for post dates.    #Labor: Induction with cytotec then FB.  #Pain: Considering fentanyl.   #FWB: Cat 1  #ID:  GBS +, PCN  #MOF: breast, bottle.  #MOC:contemplating mirena, nexplanon  #Circ:  Female, outpatient   [name] 09/15/2013, 9:40 PM    I have seen and examined this patient and agree with above documentation in the resident's note.   Ebbie Latus, M.D. [facility] Fellow 09/15/2013 11:40 PM

## 2013-09-16 ENCOUNTER — Encounter (HOSPITAL_COMMUNITY): Payer: Medicaid Other | Admitting: Anesthesiology

## 2013-09-16 ENCOUNTER — Inpatient Hospital Stay (HOSPITAL_COMMUNITY): Payer: Medicaid Other | Admitting: Anesthesiology

## 2013-09-16 LAB — RPR

## 2013-09-16 LAB — TYPE AND SCREEN
ABO/RH(D): O POS
Antibody Screen: NEGATIVE

## 2013-09-16 LAB — ABO/RH: ABO/RH(D): O POS

## 2013-09-16 MED ORDER — FENTANYL 2.5 MCG/ML BUPIVACAINE 1/10 % EPIDURAL INFUSION (WH - ANES): 14.0000 mL/h | INTRAMUSCULAR | Status: DC | PRN

## 2013-09-16 MED ORDER — SODIUM BICARBONATE 8.4 % IV SOLN
INTRAVENOUS | Status: DC | PRN
  Administered 2013-09-16: 5 mL via EPIDURAL

## 2013-09-16 MED ORDER — FENTANYL 2.5 MCG/ML BUPIVACAINE 1/10 % EPIDURAL INFUSION (WH - ANES)
14.0000 mL/h | INTRAMUSCULAR | Status: DC | PRN
  Administered 2013-09-16 – 2013-09-17 (×3): 14 mL/h via EPIDURAL
  Filled 2013-09-16 (×3): qty 125

## 2013-09-16 MED ORDER — PHENYLEPHRINE 40 MCG/ML (10ML) SYRINGE FOR IV PUSH (FOR BLOOD PRESSURE SUPPORT)
80.0000 ug | PREFILLED_SYRINGE | INTRAVENOUS | Status: DC | PRN
  Filled 2013-09-16: qty 2

## 2013-09-16 MED ORDER — OXYTOCIN 40 UNITS IN LACTATED RINGERS INFUSION - SIMPLE MED: 1.0000 m[IU]/min | INTRAVENOUS | Status: DC

## 2013-09-16 MED ORDER — FENTANYL CITRATE 0.05 MG/ML IJ SOLN
100.0000 ug | INTRAMUSCULAR | Status: DC | PRN
  Administered 2013-09-16: 100 ug via INTRAVENOUS
  Filled 2013-09-16 (×2): qty 2

## 2013-09-16 MED ORDER — PENICILLIN G POTASSIUM 5000000 UNITS IJ SOLR
5.0000 10*6.[IU] | Freq: Once | INTRAVENOUS | Status: AC
  Administered 2013-09-16: 5 10*6.[IU] via INTRAVENOUS
  Filled 2013-09-16: qty 5

## 2013-09-16 MED ORDER — LACTATED RINGERS IV SOLN: INTRAVENOUS | Status: DC

## 2013-09-16 MED ORDER — FENTANYL CITRATE 0.05 MG/ML IJ SOLN
INTRAMUSCULAR | Status: AC
  Filled 2013-09-16: qty 2

## 2013-09-16 MED ORDER — DIPHENHYDRAMINE HCL 50 MG/ML IJ SOLN: 12.5000 mg | INTRAMUSCULAR | Status: DC | PRN

## 2013-09-16 MED ORDER — LACTATED RINGERS IV SOLN
500.0000 mL | Freq: Once | INTRAVENOUS | Status: AC
  Administered 2013-09-16: 500 mL via INTRAVENOUS

## 2013-09-16 MED ORDER — FENTANYL CITRATE 0.05 MG/ML IJ SOLN
100.0000 ug | Freq: Once | INTRAMUSCULAR | Status: AC
  Administered 2013-09-16: 100 ug via INTRAVENOUS

## 2013-09-16 MED ORDER — GENTAMICIN SULFATE 40 MG/ML IJ SOLN
160.0000 mg | Freq: Three times a day (TID) | INTRAVENOUS | Status: DC
  Administered 2013-09-16: 160 mg via INTRAVENOUS
  Filled 2013-09-16 (×3): qty 4

## 2013-09-16 MED ORDER — EPHEDRINE 5 MG/ML INJ
10.0000 mg | INTRAVENOUS | Status: DC | PRN
  Filled 2013-09-16: qty 4
  Filled 2013-09-16: qty 2

## 2013-09-16 MED ORDER — EPHEDRINE 5 MG/ML INJ
10.0000 mg | INTRAVENOUS | Status: DC | PRN
  Filled 2013-09-16: qty 2

## 2013-09-16 MED ORDER — SODIUM CHLORIDE 0.9 % IV SOLN
2.0000 g | Freq: Four times a day (QID) | INTRAVENOUS | Status: DC
  Administered 2013-09-16 – 2013-09-17 (×2): 2 g via INTRAVENOUS
  Filled 2013-09-16 (×4): qty 2000

## 2013-09-16 MED ORDER — OXYTOCIN 40 UNITS IN LACTATED RINGERS INFUSION - SIMPLE MED
1.0000 m[IU]/min | INTRAVENOUS | Status: DC
  Administered 2013-09-16: 2 m[IU]/min via INTRAVENOUS
  Filled 2013-09-16: qty 1000

## 2013-09-16 MED ORDER — PHENYLEPHRINE 40 MCG/ML (10ML) SYRINGE FOR IV PUSH (FOR BLOOD PRESSURE SUPPORT)
80.0000 ug | PREFILLED_SYRINGE | INTRAVENOUS | Status: DC | PRN
  Filled 2013-09-16: qty 2
  Filled 2013-09-16: qty 10

## 2013-09-16 MED ORDER — TERBUTALINE SULFATE 1 MG/ML IJ SOLN: 0.2500 mg | Freq: Once | INTRAMUSCULAR | Status: AC | PRN

## 2013-09-16 MED ORDER — PENICILLIN G POTASSIUM 5000000 UNITS IJ SOLR
2.5000 10*6.[IU] | INTRAVENOUS | Status: DC
  Administered 2013-09-16 (×3): 2.5 10*6.[IU] via INTRAVENOUS
  Filled 2013-09-16 (×5): qty 2.5

## 2013-09-16 NOTE — Progress Notes (Signed)
Debra Bernard is a 28 y.o. G1P0000 at [redacted]w[redacted]d admitted for induction of labor due to Post dates.  Subjective: S/p amnioinfusion. Pain wellcontrolled with epidural. Currently not on pitocin.   Objective: BP 108/69  Pulse 94  Temp(Src) 99.1 F (37.3 C) (Oral)  Resp 18  Ht 5\' 5"  (1.651 m)  Wt 81.647 kg (180 lb)  BMI 29.95 kg/m2  SpO2 99%  LMP 12/02/2012      FHT:  FHR: 150s-160s bpm, variability: moderate,  accelerations:  occassional 10x10,  decelerations:  Pt has had recurrent variable decels with most contractions  UC:   irregular, couplets q8min SVE:   Dilation: 5 Effacement (%): 80 Station: -2 Exam by:: Dr. Leslie Andrea   Labs: Lab Results  Component Value Date   WBC 7.9 09/15/2013   HGB 12.4 09/15/2013   HCT 36.1 09/15/2013   MCV 89.8 09/15/2013   PLT 179 09/15/2013    Assessment / Plan: Induction of labor for postterm dates, s/p SROM and FB, Pit only at 2,   Labor: Latent phase, s/p SROM and augmentation with cytotec, s/p FB, Will restart pit 1x1. Strong concern for intolerance of labor. MVU inadequate Preeclampsia:  no signs or symptoms of toxicity Fetal Wellbeing:  Category II - IUPC and FSE, s/p Amnioinfusion Pain Control:  Epidural palced I/D:  GBS + on PCN, concern for chorio developing given fetal tachycardia afebrile. No additional abx at this time. Anticipated MOD:  NSVD vs C-section if intolerating labor  [name] 09/16/2013, 6:11 PM

## 2013-09-16 NOTE — Progress Notes (Signed)
Debra Bernard is a 28 y.o. G1P0000 at [redacted]w[redacted]d admitted for induction of labor due to Post dates.  Subjective: S/p amnioinfusion. Improved Strip Pain wellcontrolled with epidural.  Objective: BP 121/83  Pulse 93  Temp(Src) 100.3 F (37.9 C) (Axillary)  Resp 18  Ht 5\' 5"  (1.651 m)  Wt 81.647 kg (180 lb)  BMI 29.95 kg/m2  SpO2 99%  LMP 12/02/2012   Total I/O In: -  Out: 400 [Urine:400]  FHT:  FHR: 150s-160s bpm, variability: moderate,  accelerations:  occassional 10x10,  decelerations: improved from prior, but occassional late decels. Not recurrent  UC:   irregular, couplets q5-21min SVE:   Dilation: 5 Effacement (%): 70 Station: -2 Exam by:: dr Corinn Stoltzfus  Labs: Lab Results  Component Value Date   WBC 7.9 09/15/2013   HGB 12.4 09/15/2013   HCT 36.1 09/15/2013   MCV 89.8 09/15/2013   PLT 179 09/15/2013    Assessment / Plan: Induction of labor for postterm dates, s/p SROM and FB, Pit only at 2,   Labor: Latent phase, s/p SROM and augmentation with cytotec, s/p FB, continue pit Preeclampsia:  no signs or symptoms of toxicity Fetal Wellbeing:  Category II - IUPC and FSE, s/p Amnioinfusion - 100cc/hr Pain Control:  Epidural palced I/D:  Chorio Amp/gent Anticipated MOD:  NSVD vs C-section if intolerating labor  [name] 09/16/2013, 8:26 PM

## 2013-09-16 NOTE — Anesthesia Preprocedure Evaluation (Signed)

## 2013-09-16 NOTE — Progress Notes (Signed)
Debra Bernard is a 28 y.o. G1P0000 at [redacted]w[redacted]d admitted for induction of labor due to Post dates.  Subjective: FB removed. Following removal fetus had a prolonged decel. Since recovered  Objective: BP 130/84  Pulse 105  Temp(Src) 98.5 F (36.9 C) (Oral)  Resp 20  Ht 5\' 5"  (1.651 m)  Wt 81.647 kg (180 lb)  BMI 29.95 kg/m2  LMP 12/02/2012      FHT:  FHR: 130s bpm, variability: moderate,  accelerations:  Present,  decelerations:  Present prolonged decel to 50s for 80mins with recovery - reposition, fluids, off pit UC:   irregular, decreased after holding pit SVE:   Dilation: 5 Effacement (%): 50 Station: -2 Exam by:: Dr. Leslie Andrea   Labs: Lab Results  Component Value Date   WBC 7.9 09/15/2013   HGB 12.4 09/15/2013   HCT 36.1 09/15/2013   MCV 89.8 09/15/2013   PLT 179 09/15/2013    Assessment / Plan: Induction of labor for postterm dates, s/p SROM and FB  Labor: Latent phase, s/p SROM, augmentation with cytotec, s/p FB, now restart pit when able Preeclampsia:  no signs or symptoms of toxicity Fetal Wellbeing:  Category II due to recovered prolonged decel Pain Control:  Labor support without medications I/D:  GBS + on PCN Anticipated MOD:  NSVD  Debra Bernard 09/16/2013, 2:51 PM

## 2013-09-16 NOTE — Progress Notes (Signed)
Debra Bernard is a 28 y.o. G1P0000 at [redacted]w[redacted]d  admitted for induction of labor due to post dates.  Subjective: Feeling more pain.   Objective: BP 122/76  Pulse 78  Temp(Src) 98.2 F (36.8 C) (Oral)  Resp 20  Ht 5\' 5"  (1.651 m)  Wt 81.647 kg (180 lb)  BMI 29.95 kg/m2  LMP 12/02/2012      FHT:  FHR: 135 bpm, variability: moderate,  accelerations:  Present,  decelerations:  Absent UC:   regular, every 1-6 minutes SVE:   Dilation: 1 Effacement (%): Thick Station: -3;-2 Exam by:: Dr. Olevia Bowens  Labs: Lab Results  Component Value Date   WBC 7.9 09/15/2013   HGB 12.4 09/15/2013   HCT 36.1 09/15/2013   MCV 89.8 09/15/2013   PLT 179 09/15/2013    Assessment / Plan: IOL for post dates   Labor: on cytotec, SROM at 0612 Fetal Wellbeing:  Category I Pain Control:  Labor support without medications I/D:  GBS pos, started PCN since SROM  Anticipated MOD:  NSVD  Debra Bernard 09/16/2013, 6:47 AM

## 2013-09-16 NOTE — Progress Notes (Signed)
Debra Bernard is a 28 y.o. G1P0000 at [redacted]w[redacted]d admitted for induction of labor due to postdates.  Subjective:  Comfortable. Not feeling many contractions   Objective: BP 111/72  Pulse 109  Temp(Src) 99.3 F (37.4 C) (Oral)  Resp 18  Ht 5\' 5"  (1.651 m)  Wt 81.647 kg (180 lb)  BMI 29.95 kg/m2  LMP 12/02/2012      FHT:  FHR: 135 bpm, variability: moderate,  accelerations:  Present,  decelerations:  Absent UC:   Irregular  SVE:   Dilation: 1 Effacement (%): Thick Station: -3;-2 Exam by:: Dr. Olevia Bowens  Labs: Lab Results  Component Value Date   WBC 7.9 09/15/2013   HGB 12.4 09/15/2013   HCT 36.1 09/15/2013   MCV 89.8 09/15/2013   PLT 179 09/15/2013    Assessment / Plan: IOL for postdates.   Labor: on cytotec Fetal Wellbeing:  Category I Pain Control:  Labor support without medications I/D:  gbs pos- start pcn in active labor  Anticipated MOD:  NSVD  Jarrett Albor L Everest Hacking 09/16/2013, 2:32 AM

## 2013-09-16 NOTE — Progress Notes (Signed)
Debra Bernard is a 28 y.o. G1P0000 at [redacted]w[redacted]d admitted for induction of labor due to Post dates.  Subjective: Pt has been having occassional variable decels since removal of FB, they have increased in frequency and IUPC and FSE is placed.   Objective: BP 114/81  Pulse 95  Temp(Src) 99.7 F (37.6 C) (Oral)  Resp 20  Ht 5\' 5"  (1.651 m)  Wt 81.647 kg (180 lb)  BMI 29.95 kg/m2  SpO2 99%  LMP 12/02/2012      FHT:  FHR: 150s-160s bpm, variability: minimal ,  accelerations:  Abscent,  decelerations:  Present pt has had several variables and several prolonged decels UC:   irregular, couplets q19min SVE:   Dilation: 5 Effacement (%): 80 Station: -2 Exam by:: H Koran RNC  Labs: Lab Results  Component Value Date   WBC 7.9 09/15/2013   HGB 12.4 09/15/2013   HCT 36.1 09/15/2013   MCV 89.8 09/15/2013   PLT 179 09/15/2013    Assessment / Plan: Induction of labor for postterm dates, s/p SROM and FB, Pit only at 2,   Labor: Latent phase, s/p SROM and augmentation with cytotec, s/p FB, currently on pit but difficulty increasing because of fetal status. Pit turned of for the second time. Preeclampsia:  no signs or symptoms of toxicity Fetal Wellbeing:  Category II - IUPC and FSE placed for Amnioinfusion Pain Control:  Epidural palced I/D:  GBS + on PCN, concern for chorio developing given fetal tachycardia afebrile. No additional abx at this time. Anticipated MOD:  NSVD  [name] 09/16/2013, 5:04 PM

## 2013-09-16 NOTE — Anesthesia Procedure Notes (Signed)

## 2013-09-16 NOTE — Progress Notes (Signed)
Debra Bernard is a 28 y.o. G1P0000 at [redacted]w[redacted]d admitted for induction of labor due to Post dates.  Subjective: Patient uncomfortable with contractions but no distress. Explained Foley Bulb placement, all questions answered.   Objective: BP 107/69  Pulse 86  Temp(Src) 98.6 F (37 C) (Oral)  Resp 18  Ht 5\' 5"  (1.651 m)  Wt 81.647 kg (180 lb)  BMI 29.95 kg/m2  LMP 12/02/2012      FHT:  FHR: 140 bpm, variability: moderate,  accelerations:  Abscent,  decelerations:  Present variable x 1 UC:   irregular, every 5-10 minutes SVE:   Dilation: 2.5 Effacement (%): 50 Station: -2 Exam by:: Dr. Sheppard Coil  Labs: Lab Results  Component Value Date   WBC 7.9 09/15/2013   HGB 12.4 09/15/2013   HCT 36.1 09/15/2013   MCV 89.8 09/15/2013   PLT 179 09/15/2013    Assessment / Plan: Induction of labor for postterm dates,   Labor: Latent phase, s/p SROM, augmentation with cytotec, Foley bulb placed 10:30am Preeclampsia:  no signs or symptoms of toxicity Fetal Wellbeing:  Category II due to variable decels Pain Control:  Labor support without medications I/D:  GBS + on PCN Anticipated MOD:  NSVD  Emeterio Reeve 09/16/2013, 9:55 AM

## 2013-09-16 NOTE — Progress Notes (Signed)
ANTIBIOTIC CONSULT NOTE - INITIAL  Pharmacy Consult for Gentamicin Indication: Chorioamnionitis   No Known Allergies  Patient Measurements: Height: 5\' 5"  (165.1 cm) Weight: 180 lb (81.647 kg) IBW/kg (Calculated) : 57 Adjusted Body Weight: 64.4kg  Vital Signs: Temp: 99.9 F (37.7 C) (06/09 2100) Temp src: Oral (06/09 2100) BP: 110/59 mmHg (06/09 2100) Pulse Rate: 89 (06/09 2100)  Labs:  Recent Labs  09/15/13 2000  WBC 7.9  HGB 12.4  PLT 179   No results found for this basename: GENTTROUGH, GENTPEAK, GENTRANDOM,  in the last 72 hours   Microbiology: Recent Results (from the past 720 hour(s))  OB RESULTS CONSOLE GBS     Status: None   Collection Time    08/19/13 12:00 AM      Result Value Ref Range Status   GBS Positive   Final    Medications:  PCN 5 mu IV x 1 then 2.68mu IV q 4 hours from 6/8-6/9 then changed to Ampicillin 2 grams IV q 6 on 6/9 at 2000  Assessment: 28 y.o. female G1P0000 at [redacted]w[redacted]d with concerns of chorio developing given fetal tachycardia Estimated Ke = 0.325, Vd = 0.4L/kg  Goal of Therapy:  Gentamicin peak 6-8 mg/L and Trough < 1 mg/L  Plan:  Gentamicin 160 mg IV every 8 hrs  Check Scr with next labs if gentamicin continued PP Will check gentamicin levels if continued > 72hr or clinically indicated.  Donalynn Furlong Xayvier Vallez 09/16/2013,9:15 PM

## 2013-09-17 ENCOUNTER — Encounter (HOSPITAL_COMMUNITY): Payer: Self-pay

## 2013-09-17 DIAGNOSIS — O411090 Infection of amniotic sac and membranes, unspecified, unspecified trimester, not applicable or unspecified: Secondary | ICD-10-CM

## 2013-09-17 DIAGNOSIS — O480 Post-term pregnancy: Secondary | ICD-10-CM

## 2013-09-17 MED ORDER — ZOLPIDEM TARTRATE 5 MG PO TABS: 5.0000 mg | ORAL_TABLET | Freq: Every evening | ORAL | Status: DC | PRN

## 2013-09-17 MED ORDER — SIMETHICONE 80 MG PO CHEW: 80.0000 mg | CHEWABLE_TABLET | ORAL | Status: DC | PRN

## 2013-09-17 MED ORDER — ONDANSETRON HCL 4 MG PO TABS: 4.0000 mg | ORAL_TABLET | ORAL | Status: DC | PRN

## 2013-09-17 MED ORDER — DIBUCAINE 1 % RE OINT: 1.0000 "application " | TOPICAL_OINTMENT | RECTAL | Status: DC | PRN

## 2013-09-17 MED ORDER — DIPHENHYDRAMINE HCL 25 MG PO CAPS: 25.0000 mg | ORAL_CAPSULE | Freq: Four times a day (QID) | ORAL | Status: DC | PRN

## 2013-09-17 MED ORDER — SENNOSIDES-DOCUSATE SODIUM 8.6-50 MG PO TABS
2.0000 | ORAL_TABLET | ORAL | Status: DC
  Administered 2013-09-18 – 2013-09-19 (×2): 2 via ORAL
  Filled 2013-09-17 (×2): qty 2

## 2013-09-17 MED ORDER — IBUPROFEN 600 MG PO TABS
600.0000 mg | ORAL_TABLET | Freq: Four times a day (QID) | ORAL | Status: DC
  Administered 2013-09-17 – 2013-09-19 (×9): 600 mg via ORAL
  Filled 2013-09-17 (×9): qty 1

## 2013-09-17 MED ORDER — TETANUS-DIPHTH-ACELL PERTUSSIS 5-2.5-18.5 LF-MCG/0.5 IM SUSP: 0.5000 mL | Freq: Once | INTRAMUSCULAR | Status: DC

## 2013-09-17 MED ORDER — OXYCODONE-ACETAMINOPHEN 5-325 MG PO TABS
1.0000 | ORAL_TABLET | ORAL | Status: DC | PRN
  Filled 2013-09-17: qty 2

## 2013-09-17 MED ORDER — ONDANSETRON HCL 4 MG/2ML IJ SOLN: 4.0000 mg | INTRAMUSCULAR | Status: DC | PRN

## 2013-09-17 MED ORDER — BENZOCAINE-MENTHOL 20-0.5 % EX AERO
1.0000 "application " | INHALATION_SPRAY | CUTANEOUS | Status: DC | PRN
  Administered 2013-09-17: 1 via TOPICAL
  Filled 2013-09-17: qty 56

## 2013-09-17 MED ORDER — LANOLIN HYDROUS EX OINT: TOPICAL_OINTMENT | CUTANEOUS | Status: DC | PRN

## 2013-09-17 MED ORDER — PRENATAL MULTIVITAMIN CH
1.0000 | ORAL_TABLET | Freq: Every day | ORAL | Status: DC
  Administered 2013-09-17 – 2013-09-18 (×2): 1 via ORAL
  Filled 2013-09-17 (×2): qty 1

## 2013-09-17 MED ORDER — WITCH HAZEL-GLYCERIN EX PADS: 1.0000 "application " | MEDICATED_PAD | CUTANEOUS | Status: DC | PRN

## 2013-09-17 NOTE — Progress Notes (Signed)
Ur chart review completed.  

## 2013-09-17 NOTE — Clinical Social Work Maternal (Signed)
    Clinical Social Work Department PSYCHOSOCIAL ASSESSMENT - MATERNAL/CHILD 09/17/2013  Patient:  Debra Bernard, Debra Bernard  Account Number:  [id number]  Admit Date:  09/15/2013  Ardine Eng Name:   BB Markarian    Clinical Social Worker:  Gerri Spore, LCSW   Date/Time:  09/17/2013 12:33 PM  Date Referred:  09/17/2013   Referral source  CN     Referred reason  [facility]   Other referral source:    I:  FAMILY / HOME ENVIRONMENT Child's legal guardian:  PARENT  Guardian - Name Guardian - Age Guardian - Address  Dayna Alia [address] [place],  [address]  [place]   Other household support members/support persons Name Relationship DOB   MOTHER    FATHER    Other support:   Family/friends    II  PSYCHOSOCIAL DATA Information Source:  Patient Interview  Insurance risk surveyor Resources Employment:   Financial resources:  Self Pay If [place]:   Other  [place] / Grade:   Maternity Care Coordinator / Child Services Coordination / Early Interventions:  Cultural issues impacting care:    III  STRENGTHS Strengths  Adequate Resources  Home prepared for Child (including basic supplies)  Supportive family/friends   Strength comment:    IV  RISK FACTORS AND CURRENT PROBLEMS Current Problem:  YES   Risk Factor & Current Problem Patient Issue Family Issue Risk Factor / Current Problem Comment  Other - See comment [place] @ 30 weeks    V  SOCIAL WORK ASSESSMENT CSW referral received to assess pt's reason for [facility] @ 30 weeks.  Pt did not provide a reason for establishing PNC late.  She told CSW that she was in another state.  Per chart review pt relocated from [place] in May '15.  Per pt, she moved to the area in March '15.  She denies any illegal substance use & verbalized an understanding of hospital drug testing policy.  UDS collection pending, as well as meconium results.  Pt has all the necessary supplies for the infant & good family support.   Several visitors were at bedside, acting as support system.  She denies any depression history.  CSW will discussed PP depression signs/symptoms with pt & encouraged her to seek medical attention if needed.  CSW will continue to monitor drug screen results & make a referral if needed.      VI SOCIAL WORK PLAN Social Work Plan  No Further Intervention Required / No Barriers to Discharge   Type of pt/family education:   If child protective services report - county:   If child protective services report - date:   Information/referral to community resources comment:   Other social work plan:

## 2013-09-17 NOTE — Anesthesia Postprocedure Evaluation (Signed)
Anesthesia Post Note  Patient: Debra Bernard  Procedure(s) Performed: * No procedures listed *  Anesthesia type: Epidural  Patient location: Mother/Baby  Post pain: Pain level controlled  Post assessment: Post-op Vital signs reviewed  Last Vitals:  Filed Vitals:   09/17/13 0740  BP: 119/82  Pulse: 93  Temp: 36.8 C  Resp: 18    Post vital signs: Reviewed  Level of consciousness:alert  Complications: No apparent anesthesia complications

## 2013-09-17 NOTE — Progress Notes (Signed)
Debra Bernard is a 28 y.o. G1P0000 at [redacted]w[redacted]d  admitted for induction of labor due to Post dates.  Subjective: Feeling increased pressure but   Objective: BP 104/56  Pulse 88  Temp(Src) 100.6 F (38.1 C) (Axillary)  Resp 18  Ht 5\' 5"  (1.651 m)  Wt 81.647 kg (180 lb)  BMI 29.95 kg/m2  SpO2 99%  LMP 12/02/2012   Total I/O In: -  Out: 400 [Urine:400]  FHT:  FHR: 165 bpm, variability: moderate,  accelerations:  Present,  decelerations:  Present variables  UC:   irregular, every 2-5 minutes SVE:  Dilation: 8 Effacement (%): 100 Station: +2 Exam by: leftwich-kirby   Labs: Lab Results  Component Value Date   WBC 7.9 09/15/2013   HGB 12.4 09/15/2013   HCT 36.1 09/15/2013   MCV 89.8 09/15/2013   PLT 179 09/15/2013    Assessment / Plan: Induction of labor for postterm dates, s/p SROM and FB, Pit  #Chorio: febrile 100.6 @ 0998.  - amp and gent   Labor: on pit, IUPC in place  Fetal Wellbeing:  Category II Pain Control:  Epidural I/D:  Amp and Norva Karvonen  Anticipated MOD:  NSVD  [name] 09/17/2013, 12:09 AM

## 2013-09-17 NOTE — H&P (Signed)
Attestation of Attending Supervision of Advanced Practitioner (CNM/NP): Evaluation and management procedures were performed by the Advanced Practitioner under my supervision and collaboration.  I have reviewed the Advanced Practitioner's note and chart, and I agree with the management and plan.  Everlyn Farabaugh 09/17/2013 3:53 PM

## 2013-09-17 NOTE — Lactation Note (Signed)
This note was copied from the chart of Debra Bernard. Lactation Consultation Note [facility] LC resources given and discussed.  Encouraged to feed with early cues on demand.  Early newborn behavior discussed.  Hand expression demonstrated with colostrum visible. Baby is latched when I arrived in cradle hold with good latch, but abdomen is facing ceiling and head is twisted.  Assisted with placing baby belly to moms chest.  Baby has wide flanged lips and goo rhythmic sucking and swallows.  Mom denies pain. Last void >12, mom is aware and will continue to wake baby for feedings.   Mom to call for assist as needed.    Patient Name: Debra Bernard KAJGO'T Date: 09/17/2013 Reason for consult: Initial assessment   Maternal Data Has patient been taught Hand Expression?: Yes Does the patient have breastfeeding experience prior to this delivery?: No  Feeding Feeding Type: Breast Fed Length of feed:  (15 minutes and still feeding)  LATCH Score/Interventions Latch: Grasps breast easily, tongue down, lips flanged, rhythmical sucking.  Audible Swallowing: A few with stimulation Intervention(s): Skin to skin;Hand expression  Type of Nipple: Everted at rest and after stimulation  Comfort (Breast/Nipple): Soft / non-tender     Hold (Positioning): Assistance needed to correctly position infant at breast and maintain latch. Intervention(s): Breastfeeding basics reviewed;Support Pillows;Position options;Skin to skin  LATCH Score: 8  Lactation Tools Discussed/Used     Consult Status Consult Status: Follow-up Date: 09/18/13 Follow-up type: In-patient    Justice Britain 09/17/2013, 10:07 PM

## 2013-09-18 NOTE — Progress Notes (Signed)
I have seen and examined this patient and I agree with the above. Serita Grammes CNM 8:30 AM 09/18/2013

## 2013-09-18 NOTE — Lactation Note (Signed)
This note was copied from the chart of Debra Bernard. Lactation Consultation Note Follow up visit at 52 hours of age.  Mom has given 3 bottles and 4 breast feedings with 2 voids in lifetime.  Encouraged mom to feed with early feeding cues and on demand.  Mom is to wake baby for feedings if baby is sleepy for 3 hours.  Mom is giving Gerber formula, but requests similac to be hala.  Mom encouraged to breast feed 10-12 times in next 24 hours and encouraged to breast feed prior to offering bottle feedings.  Baby latches well with few swallows and stimulation needed after 1st few minutes of breast feeding.  Gloved finger assessment reveals tight anterior frenulum.   Baby does not extend tongue past gum line and bites on finger.  Mom reports nipple soreness with latch on, but resolves after a few seconds.  Will monitor this dyad for appropriate milk transfer. Mom to call for assist as needed. Report to [facility] RN, Mickel Baas.   Patient Name: Debra Bernard [id number] Date: 09/18/2013 Reason for consult: Follow-up assessment   Maternal Data    Feeding Feeding Type: Breast Fed Length of feed:  (14 minutes observed)  LATCH Score/Interventions Latch: Grasps breast easily, tongue down, lips flanged, rhythmical sucking.  Audible Swallowing: A few with stimulation Intervention(s): Hand expression;Skin to skin  Type of Nipple: Everted at rest and after stimulation  Comfort (Breast/Nipple): Soft / non-tender     Hold (Positioning): Assistance needed to correctly position infant at breast and maintain latch. Intervention(s): Skin to skin;Position options;Support Pillows;Breastfeeding basics reviewed  LATCH Score: 8  Lactation Tools Discussed/Used     Consult Status Consult Status: Follow-up Date: 09/19/13 Follow-up type: In-patient    Kendell Bane Justine Null 09/18/2013, 9:37 PM

## 2013-09-18 NOTE — Progress Notes (Signed)
Post Partum Day 1 Subjective: up ad lib, voiding, tolerating PO and minimal to no bleeding Last [place] [facility] Monday 09/15/13 Pt reports mild vaginal pain that has been constant since the delivery  Objective: Blood pressure 111/70, pulse 79, temperature 98.4 F (36.9 C), temperature source Oral, resp. rate 18, height 5\' 5"  (1.651 m), weight 81.647 kg (180 lb), last menstrual period 12/02/2012, SpO2 99.00%, unknown if currently breastfeeding.  Physical Exam:  General: alert, cooperative and no distress Lochia: appropriate Uterine Fundus: firm with mild abdominal tenderness to palpation Incision: N/A DVT Evaluation: No evidence of DVT seen on physical exam. No cords or calf tenderness. No significant calf/ankle edema.   Recent Labs  09/15/13 2000  HGB 12.4  HCT 36.1    Assessment/Plan: Plan for discharge tomorrow as baby has not yet received GBS treatment Pt is breast and bottle feeding Discussed MOC: Nexplanon    LOS: 3 days   Lillia Abed 09/18/2013, 7:17 AM

## 2013-09-19 MED ORDER — MEASLES, MUMPS & RUBELLA VAC ~~LOC~~ INJ
0.5000 mL | INJECTION | Freq: Once | SUBCUTANEOUS | Status: AC
  Administered 2013-09-19: 0.5 mL via SUBCUTANEOUS
  Filled 2013-09-19: qty 0.5

## 2013-09-19 MED ORDER — IBUPROFEN 600 MG PO TABS: 600.0000 mg | ORAL_TABLET | Freq: Four times a day (QID) | ORAL | Status: DC

## 2013-09-19 NOTE — Discharge Instructions (Signed)

## 2013-09-19 NOTE — Discharge Summary (Signed)
Obstetric Discharge Summary Reason for Admission: induction of labor for postdates Prenatal Procedures: NST and ultrasound Intrapartum Procedures: spontaneous vaginal delivery Postpartum Procedures: none Complications-Operative and Postpartum: none Hemoglobin  Date Value Ref Range Status  09/15/2013 12.4  12.0 - 15.0 g/dL Final     HCT  Date Value Ref Range Status  09/15/2013 36.1  36.0 - 46.0 % Final   Hospital Course:  Pt underwent IOL for postdates and delivered a healthy female without complications.  2nd perineal laceration was repaired.  Pt is breastfeeding well and desires Nexplanon for contraception.   Physical Exam:  General: alert, cooperative and no distress Lochia: appropriate Uterine Fundus: firm Incision: n/a DVT Evaluation: No evidence of DVT seen on physical exam. Negative Homan's sign.   Discharge Diagnoses: Term Pregnancy-delivered  Discharge Information: Date: 09/19/2013 Activity: pelvic rest Diet: routine Medications: Ibuprofen Condition: stable Instructions: refer to practice specific booklet Discharge to: home Follow-up Information   Follow up with [facility]. Schedule an appointment as soon as possible for a visit in 4 weeks.   Contact information:   [place] [id number]       Newborn Data: Live born female  Birth Weight: 7 lb 6.5 oz (3360 g) APGAR: 6, 9  Home with mother.  CRESENZO-DISHMAN,Mounir Skipper 09/19/2013, 7:28 AM

## 2013-09-29 NOTE — Discharge Summary (Signed)
Attestation of Attending Supervision of Advanced Practitioner (CNM/NP): Evaluation and management procedures were performed by the Advanced Practitioner under my supervision and collaboration. I have reviewed the Advanced Practitioner's note and chart, and I agree with the management and plan.  LEGGETT,KELLY H. 1:14 PM

## 2013-10-16 ENCOUNTER — Encounter: Payer: Self-pay | Admitting: Family Medicine

## 2013-10-16 ENCOUNTER — Ambulatory Visit (INDEPENDENT_AMBULATORY_CARE_PROVIDER_SITE_OTHER): Payer: Medicaid Other | Admitting: Family Medicine

## 2013-10-16 DIAGNOSIS — T384X5A Adverse effect of oral contraceptives, initial encounter: Secondary | ICD-10-CM

## 2013-10-16 MED ORDER — NORETHINDRONE 0.35 MG PO TABS: 1.0000 | ORAL_TABLET | Freq: Every day | ORAL | Status: DC

## 2013-10-16 NOTE — Patient Instructions (Addendum)
Contraception Choices Contraception (birth control) is the use of any methods or devices to prevent pregnancy. Below are some methods to help avoid pregnancy. HORMONAL METHODS   Contraceptive implant. This is a thin, plastic tube containing progesterone hormone. It does not contain estrogen hormone. Your health care provider inserts the tube in the inner part of the upper arm. The tube can remain in place for up to 3 years. After 3 years, the implant must be removed. The implant prevents the ovaries from releasing an egg (ovulation), thickens the cervical mucus to prevent sperm from entering the uterus, and thins the lining of the inside of the uterus.  Progesterone-only injections. These injections are given every 3 months by your health care provider to prevent pregnancy. This synthetic progesterone hormone stops the ovaries from releasing eggs. It also thickens cervical mucus and changes the uterine lining. This makes it harder for sperm to survive in the uterus.  Birth control pills. These pills contain estrogen and progesterone hormone. They work by preventing the ovaries from releasing eggs (ovulation). They also cause the cervical mucus to thicken, preventing the sperm from entering the uterus. Birth control pills are prescribed by a health care provider.Birth control pills can also be used to treat heavy periods.  Minipill. This type of birth control pill contains only the progesterone hormone. They are taken every day of each month and must be prescribed by your health care provider.  Birth control patch. The patch contains hormones similar to those in birth control pills. It must be changed once a week and is prescribed by a health care provider.  Vaginal ring. The ring contains hormones similar to those in birth control pills. It is left in the vagina for 3 weeks, removed for 1 week, and then a new one is put back in place. The patient must be comfortable inserting and removing the ring  from the vagina.A health care provider's prescription is necessary.  Emergency contraception. Emergency contraceptives prevent pregnancy after unprotected sexual intercourse. This pill can be taken right after sex or up to 5 days after unprotected sex. It is most effective the sooner you take the pills after having sexual intercourse. Most emergency contraceptive pills are available without a prescription. Check with your pharmacist. Do not use emergency contraception as your only form of birth control. BARRIER METHODS   Female condom. This is a thin sheath (latex or rubber) that is worn over the penis during sexual intercourse. It can be used with spermicide to increase effectiveness.  Female condom. This is a soft, loose-fitting sheath that is put into the vagina before sexual intercourse.  Diaphragm. This is a soft, latex, dome-shaped barrier that must be fitted by a health care provider. It is inserted into the vagina, along with a spermicidal jelly. It is inserted before intercourse. The diaphragm should be left in the vagina for 6 to 8 hours after intercourse.  Cervical cap. This is a round, soft, latex or plastic cup that fits over the cervix and must be fitted by a health care provider. The cap can be left in place for up to 48 hours after intercourse.  Sponge. This is a soft, circular piece of polyurethane foam. The sponge has spermicide in it. It is inserted into the vagina after wetting it and before sexual intercourse.  Spermicides. These are chemicals that kill or block sperm from entering the cervix and uterus. They come in the form of creams, jellies, suppositories, foam, or tablets. They do not require a   prescription. They are inserted into the vagina with an applicator before having sexual intercourse. The process must be repeated every time you have sexual intercourse. INTRAUTERINE CONTRACEPTION  Intrauterine device (IUD). This is a T-shaped device that is put in a woman's uterus  during a menstrual period to prevent pregnancy. There are 2 types:  Copper IUD. This type of IUD is wrapped in copper wire and is placed inside the uterus. Copper makes the uterus and fallopian tubes produce a fluid that kills sperm. It can stay in place for 10 years.  Hormone IUD. This type of IUD contains the hormone progestin (synthetic progesterone). The hormone thickens the cervical mucus and prevents sperm from entering the uterus, and it also thins the uterine lining to prevent implantation of a fertilized egg. The hormone can weaken or kill the sperm that get into the uterus. It can stay in place for 3-5 years, depending on which type of IUD is used. PERMANENT METHODS OF CONTRACEPTION  Female tubal ligation. This is when the woman's fallopian tubes are surgically sealed, tied, or blocked to prevent the egg from traveling to the uterus.  Hysteroscopic sterilization. This involves placing a small coil or insert into each fallopian tube. Your doctor uses a technique called hysteroscopy to do the procedure. The device causes scar tissue to form. This results in permanent blockage of the fallopian tubes, so the sperm cannot fertilize the egg. It takes about 3 months after the procedure for the tubes to become blocked. You must use another form of birth control for these 3 months.  Female sterilization. This is when the female has the tubes that carry sperm tied off (vasectomy).This blocks sperm from entering the vagina during sexual intercourse. After the procedure, the man can still ejaculate fluid (semen). NATURAL PLANNING METHODS  Natural family planning. This is not having sexual intercourse or using a barrier method (condom, diaphragm, cervical cap) on days the woman could become pregnant.  Calendar method. This is keeping track of the length of each menstrual cycle and identifying when you are fertile.  Ovulation method. This is avoiding sexual intercourse during ovulation.  Symptothermal  method. This is avoiding sexual intercourse during ovulation, using a thermometer and ovulation symptoms.  Post-ovulation method. This is timing sexual intercourse after you have ovulated. Regardless of which type or method of contraception you choose, it is important that you use condoms to protect against the transmission of sexually transmitted infections (STIs). Talk with your health care provider about which form of contraception is most appropriate for you. Document Released: 03/27/2005 Document Revised: 04/01/2013 Document Reviewed: 09/19/2012 Exit[employer] Patient Information 2015 [employer]. This information is not intended to replace advice given to you by your health care provider. Make sure you discuss any questions you have with your health care provider.  

## 2013-10-16 NOTE — Progress Notes (Signed)
  Subjective:     Debra Bernard is a 28 y.o. female who presents for a postpartum visit. She is 6 weeks postpartum following a spontaneous vaginal delivery. I have fully reviewed the prenatal and intrapartum course. The delivery was at 66 gestational weeks. Outcome: spontaneous vaginal delivery. Anesthesia: epidural. Postpartum course has been unremarkable. Baby's course has been well. Baby is feeding by breast. Bleeding no bleeding. Bowel function is normal. Bladder function is normal. Patient is not sexually active. Contraception method is none. Postpartum depression screening: negative.  The following portions of the patient's history were reviewed and updated as appropriate: allergies, current medications, past family history, past medical history, past social history, past surgical history and problem list.  Review of Systems A comprehensive review of systems was negative.   Objective:    BP 111/74  Pulse 88  Temp(Src) 99.6 F (37.6 C) (Oral)  Ht 5\' 4"  (1.626 m)  Wt 164 lb 11.2 oz (74.707 kg)  BMI 28.26 kg/m2  Breastfeeding? Yes  General:  alert, cooperative and appears stated age   Vulva:  normal  Vagina: normal vagina  Cervix:  multiparous appearance  Corpus: normal size, contour, position, consistency, mobility, non-tender  Adnexa:  normal adnexa        Assessment:     Normal postpartum exam. Pap smear not done at today's visit.   Plan:    1. Contraception: oral progesterone-only contraceptive, if gets insurance can return for LARC.--condoms for backup through first cycle. 2. No pap needed for 3 years. 3. Follow up in: 6 months or as needed.

## 2013-12-03 ENCOUNTER — Encounter: Payer: Self-pay | Admitting: General Practice

## 2013-12-03 ENCOUNTER — Telehealth: Payer: Self-pay | Admitting: General Practice

## 2013-12-03 ENCOUNTER — Ambulatory Visit: Payer: Self-pay | Admitting: Obstetrics & Gynecology

## 2013-12-03 NOTE — Telephone Encounter (Signed)
Patient no showed for appt today. Called patient and message stated this person does not accept incoming calls. Will send letter

## 2014-02-09 ENCOUNTER — Encounter: Payer: Self-pay | Admitting: Family Medicine

## 2014-04-10 NOTE — L&D Delivery Note (Signed)
Delivery Note  Patient arrived with contractions and was dilated to 4.5 cm. She continued to progress through labor; AROM performed at 1145. Patient was complete by 1310.   At 1:16 PM a viable female was delivered via Vaginal, Spontaneous Delivery (Presentation: Left Occiput Anterior).  APGAR: 8, 9; weight pending.   Placenta status: Intact, Spontaneous.  Cord: 3 vessels with the following complications: None.  Cord pH: N/A  Anesthesia: Epidural  Episiotomy: None Lacerations:  1st degree, perineal at 6 o'clock Suture Repair: vicryl Est. Blood Loss (mL):  200  Mom to postpartum.  Baby to Couplet care / Skin to Skin.  Olamide Lahaie Torrie Mayers 01/16/2015, 2:03 PM

## 2014-11-16 ENCOUNTER — Other Ambulatory Visit: Payer: Self-pay | Admitting: Certified Nurse Midwife

## 2014-11-16 ENCOUNTER — Ambulatory Visit (INDEPENDENT_AMBULATORY_CARE_PROVIDER_SITE_OTHER): Payer: Self-pay | Admitting: Certified Nurse Midwife

## 2014-11-16 ENCOUNTER — Encounter: Payer: Self-pay | Admitting: Certified Nurse Midwife

## 2014-11-16 VITALS — BP 117/67 | HR 97 | Temp 98.5°F | Wt 174.2 lb

## 2014-11-16 DIAGNOSIS — Z23 Encounter for immunization: Secondary | ICD-10-CM

## 2014-11-16 DIAGNOSIS — O0933 Supervision of pregnancy with insufficient antenatal care, third trimester: Secondary | ICD-10-CM | POA: Insufficient documentation

## 2014-11-16 LAB — POCT URINALYSIS DIP (DEVICE)
Bilirubin Urine: NEGATIVE
Glucose, UA: NEGATIVE mg/dL
Hgb urine dipstick: NEGATIVE
Ketones, ur: NEGATIVE mg/dL
Nitrite: NEGATIVE
Protein, ur: 30 mg/dL — AB
Specific Gravity, Urine: 1.02 (ref 1.005–1.030)
Urobilinogen, UA: 0.2 mg/dL (ref 0.0–1.0)
pH: 7.5 (ref 5.0–8.0)

## 2014-11-16 MED ORDER — TETANUS-DIPHTH-ACELL PERTUSSIS 5-2.5-18.5 LF-MCG/0.5 IM SUSP
0.5000 mL | Freq: Once | INTRAMUSCULAR | Status: AC
  Administered 2014-11-16: 0.5 mL via INTRAMUSCULAR

## 2014-11-16 NOTE — Progress Notes (Signed)
Subjective:    Debra Bernard is a G2P1001 [redacted]w[redacted]d being seen today for her first obstetrical visit.  Her obstetrical history is significant for late prenatal care. She did receive some prenatal care in [place] and those prenatal records will be scanned into her record here. Patient does intend to breast feed. Pregnancy history fully reviewed. Last baby was SVD.  Patient reports no complaints.  Filed Vitals:   11/16/14 1327  BP: 117/67  Pulse: 97  Temp: 98.5 F (36.9 C)  Weight: 174 lb 3.2 oz (79.017 kg)    HISTORY: OB History  Gravida Para Term Preterm AB SAB TAB Ectopic Multiple Living  2 1 1  0 0 0 0 0 0 1    # Outcome Date GA Lbr Len/2nd Weight Sex Delivery Anes PTL Lv  2 Current           1 Term 09/17/13 [redacted]w[redacted]d 12:37 / 00:31 7 lb 6.5 oz (3.36 kg) M Vag-Spont EPI  Y     Past Medical History  Diagnosis Date  . Medical history non-contributory    Past Surgical History  Procedure Laterality Date  . No past surgeries     Family History  Problem Relation Age of Onset  . Alcohol abuse Neg Hx   . Arthritis Neg Hx   . Asthma Neg Hx   . Birth defects Neg Hx   . Cancer Neg Hx   . COPD Neg Hx   . Depression Neg Hx   . Diabetes Neg Hx   . Drug abuse Neg Hx   . Early death Neg Hx   . Hearing loss Neg Hx   . Hyperlipidemia Neg Hx   . Heart disease Neg Hx   . Hypertension Neg Hx   . Kidney disease Neg Hx   . Learning disabilities Neg Hx   . Mental illness Neg Hx   . Mental retardation Neg Hx   . Miscarriages / Stillbirths Neg Hx   . Stroke Neg Hx   . Vision loss Neg Hx      Exam    Uterus:     Pelvic Exam:    Perineum: No Hemorrhoids   Vulva: normal   Vagina:     pH:    Cervix: closed/thick/high   Adnexa:    Bony Pelvis: gynecoid  System: Breast:  normal appearance, no masses or tenderness   Skin: normal coloration and turgor, no rashes    Neurologic: oriented, normal   Extremities: normal strength, tone, and muscle mass   HEENT    Mouth/Teeth  mucous membranes moist, pharynx normal without lesions   Neck supple and no masses   Cardiovascular: regular rate and rhythm   Respiratory:  appears well, vitals normal, no respiratory distress, acyanotic, normal RR, ear and throat exam is normal, neck free of mass or lymphadenopathy, chest clear, no wheezing, crepitations, rhonchi, normal symmetric air entry   Abdomen: soft, non-tender; bowel sounds normal; no masses,  no organomegaly   Urinary: urethral meatus normal      Assessment:    Pregnancy: G2P1001 Patient Active Problem List   Diagnosis Date Noted  . Limited prenatal care in third trimester 11/16/2014        Plan:     Initial labs drawn. Ultrasound scheduled 1 hour GTT today TDAP today Prenatal vitamins. Problem list reviewed and updated. Genetic Screening discussed : Too late  Ultrasound discussed; fetal survey: ordered.  Follow up in 2 weeks. 50% of 30 min visit spent on counseling  and coordination of care.    [name] 11/16/2014

## 2014-11-16 NOTE — Progress Notes (Signed)
Initial OB appointment New OB Educational /28 wk material given 1 hour gtt with labs Tdap vaccine given Breastfeeding tip of the week reviewed Leukocytes: large

## 2014-11-16 NOTE — Patient Instructions (Signed)
Third Trimester of Pregnancy The third trimester is from week 29 through week 42, months 7 through 9. The third trimester is a time when the fetus is growing rapidly. At the end of the ninth month, the fetus is about 20 inches in length and weighs 6-10 pounds.  BODY CHANGES Your body goes through many changes during pregnancy. The changes vary from woman to woman.   Your weight will continue to increase. You can expect to gain 25-35 pounds (11-16 kg) by the end of the pregnancy.  You may begin to get stretch marks on your hips, abdomen, and breasts.  You may urinate more often because the fetus is moving lower into your pelvis and pressing on your bladder.  You may develop or continue to have heartburn as a result of your pregnancy.  You may develop constipation because certain hormones are causing the muscles that push waste through your intestines to slow down.  You may develop hemorrhoids or swollen, bulging veins (varicose veins).  You may have pelvic pain because of the weight gain and pregnancy hormones relaxing your joints between the bones in your pelvis. Backaches may result from overexertion of the muscles supporting your posture.  You may have changes in your hair. These can include thickening of your hair, rapid growth, and changes in texture. Some women also have hair loss during or after pregnancy, or hair that feels dry or thin. Your hair will most likely return to normal after your baby is born.  Your breasts will continue to grow and be tender. A yellow discharge may leak from your breasts called colostrum.  Your belly button may stick out.  You may feel short of breath because of your expanding uterus.  You may notice the fetus "dropping," or moving lower in your abdomen.  You may have a bloody mucus discharge. This usually occurs a few days to a week before labor begins.  Your cervix becomes thin and soft (effaced) near your due date. WHAT TO EXPECT AT YOUR PRENATAL  EXAMS  You will have prenatal exams every 2 weeks until week 36. Then, you will have weekly prenatal exams. During a routine prenatal visit:  You will be weighed to make sure you and the fetus are growing normally.  Your blood pressure is taken.  Your abdomen will be measured to track your baby's growth.  The fetal heartbeat will be listened to.  Any test results from the previous visit will be discussed.  You may have a cervical check near your due date to see if you have effaced. At around 36 weeks, your caregiver will check your cervix. At the same time, your caregiver will also perform a test on the secretions of the vaginal tissue. This test is to determine if a type of bacteria, Group B streptococcus, is present. Your caregiver will explain this further. Your caregiver may ask you:  What your birth plan is.  How you are feeling.  If you are feeling the baby move.  If you have had any abnormal symptoms, such as leaking fluid, bleeding, severe headaches, or abdominal cramping.  If you have any questions. Other tests or screenings that may be performed during your third trimester include:  Blood tests that check for low iron levels (anemia).  Fetal testing to check the health, activity level, and growth of the fetus. Testing is done if you have certain medical conditions or if there are problems during the pregnancy. FALSE LABOR You may feel small, irregular contractions that   eventually go away. These are called Braxton Hicks contractions, or false labor. Contractions may last for hours, days, or even weeks before true labor sets in. If contractions come at regular intervals, intensify, or become painful, it is best to be seen by your caregiver.  SIGNS OF LABOR   Menstrual-like cramps.  Contractions that are 5 minutes apart or less.  Contractions that start on the top of the uterus and spread down to the lower abdomen and back.  A sense of increased pelvic pressure or back  pain.  A watery or bloody mucus discharge that comes from the vagina. If you have any of these signs before the 37th week of pregnancy, call your caregiver right away. You need to go to the hospital to get checked immediately. HOME CARE INSTRUCTIONS   Avoid all smoking, herbs, alcohol, and unprescribed drugs. These chemicals affect the formation and growth of the baby.  Follow your caregiver's instructions regarding medicine use. There are medicines that are either safe or unsafe to take during pregnancy.  Exercise only as directed by your caregiver. Experiencing uterine cramps is a good sign to stop exercising.  Continue to eat regular, healthy meals.  Wear a good support bra for breast tenderness.  Do not use hot tubs, steam rooms, or saunas.  Wear your seat belt at all times when driving.  Avoid raw meat, uncooked cheese, cat litter boxes, and soil used by cats. These carry germs that can cause birth defects in the baby.  Take your prenatal vitamins.  Try taking a stool softener (if your caregiver approves) if you develop constipation. Eat more high-fiber foods, such as fresh vegetables or fruit and whole grains. Drink plenty of fluids to keep your urine clear or pale yellow.  Take warm sitz baths to soothe any pain or discomfort caused by hemorrhoids. Use hemorrhoid cream if your caregiver approves.  If you develop varicose veins, wear support hose. Elevate your feet for 15 minutes, 3-4 times a day. Limit salt in your diet.  Avoid heavy lifting, wear low heal shoes, and practice good posture.  Rest a lot with your legs elevated if you have leg cramps or low back pain.  Visit your dentist if you have not gone during your pregnancy. Use a soft toothbrush to brush your teeth and be gentle when you floss.  A sexual relationship may be continued unless your caregiver directs you otherwise.  Do not travel far distances unless it is absolutely necessary and only with the approval  of your caregiver.  Take prenatal classes to understand, practice, and ask questions about the labor and delivery.  Make a trial run to the hospital.  Pack your hospital bag.  Prepare the baby's nursery.  Continue to go to all your prenatal visits as directed by your caregiver. SEEK MEDICAL CARE IF:  You are unsure if you are in labor or if your water has broken.  You have dizziness.  You have mild pelvic cramps, pelvic pressure, or nagging pain in your abdominal area.  You have persistent nausea, vomiting, or diarrhea.  You have a bad smelling vaginal discharge.  You have pain with urination. SEEK IMMEDIATE MEDICAL CARE IF:   You have a fever.  You are leaking fluid from your vagina.  You have spotting or bleeding from your vagina.  You have severe abdominal cramping or pain.  You have rapid weight loss or gain.  You have shortness of breath with chest pain.  You notice sudden or extreme swelling   of your face, hands, ankles, feet, or legs.  You have not felt your baby move in over an hour.  You have severe headaches that do not go away with medicine.  You have vision changes. Document Released: 03/21/2001 Document Revised: 04/01/2013 Document Reviewed: 05/28/2012 [employer] Patient Information 2015 [employer]. This information is not intended to replace advice given to you by your health care provider. Make sure you discuss any questions you have with your health care provider.  

## 2014-11-17 ENCOUNTER — Other Ambulatory Visit: Payer: Self-pay | Admitting: Certified Nurse Midwife

## 2014-11-17 DIAGNOSIS — Z3A32 32 weeks gestation of pregnancy: Secondary | ICD-10-CM

## 2014-11-17 DIAGNOSIS — O0933 Supervision of pregnancy with insufficient antenatal care, third trimester: Secondary | ICD-10-CM

## 2014-11-17 DIAGNOSIS — Z23 Encounter for immunization: Secondary | ICD-10-CM

## 2014-11-17 LAB — GC/CHLAMYDIA PROBE AMP
CT Probe RNA: NEGATIVE
GC Probe RNA: NEGATIVE

## 2014-11-17 LAB — CULTURE, OB URINE: Colony Count: 3000

## 2014-11-17 LAB — GLUCOSE TOLERANCE, 1 HOUR (50G) W/O FASTING: Glucose, 1 Hour GTT: 99 mg/dL (ref 70–140)

## 2014-11-18 LAB — PRENATAL PROFILE (SOLSTAS)
Antibody Screen: NEGATIVE
Basophils Absolute: 0 10*3/uL (ref 0.0–0.1)
Basophils Relative: 0 % (ref 0–1)
Eosinophils Absolute: 0.1 10*3/uL (ref 0.0–0.7)
Eosinophils Relative: 1 % (ref 0–5)
HCT: 35 % — ABNORMAL LOW (ref 36.0–46.0)
HIV 1&2 Ab, 4th Generation: NONREACTIVE
Hemoglobin: 12.1 g/dL (ref 12.0–15.0)
Hepatitis B Surface Ag: NEGATIVE
Lymphocytes Relative: 20 % (ref 12–46)
Lymphs Abs: 1.3 10*3/uL (ref 0.7–4.0)
MCH: 29.5 pg (ref 26.0–34.0)
MCHC: 34.6 g/dL (ref 30.0–36.0)
MCV: 85.4 fL (ref 78.0–100.0)
MPV: 12.1 fL (ref 8.6–12.4)
Monocytes Absolute: 0.5 10*3/uL (ref 0.1–1.0)
Monocytes Relative: 7 % (ref 3–12)
Neutro Abs: 4.8 10*3/uL (ref 1.7–7.7)
Neutrophils Relative %: 72 % (ref 43–77)
Platelets: 204 10*3/uL (ref 150–400)
RBC: 4.1 MIL/uL (ref 3.87–5.11)
RDW: 14.1 % (ref 11.5–15.5)
Rh Type: POSITIVE
Rubella: 1.1 Index — ABNORMAL HIGH (ref <0.90)
WBC: 6.6 10*3/uL (ref 4.0–10.5)

## 2014-11-18 LAB — PRESCRIPTION MONITORING PROFILE (19 PANEL)
Amphetamine/Meth: NEGATIVE ng/mL
Barbiturate Screen, Urine: NEGATIVE ng/mL
Benzodiazepine Screen, Urine: NEGATIVE ng/mL
Buprenorphine, Urine: NEGATIVE ng/mL
Cannabinoid Scrn, Ur: NEGATIVE ng/mL
Carisoprodol, Urine: NEGATIVE ng/mL
Cocaine Metabolites: NEGATIVE ng/mL
Creatinine, Urine: 127.1 mg/dL (ref >20.0)
Fentanyl, Ur: NEGATIVE ng/mL
MDMA URINE: NEGATIVE ng/mL
Meperidine, Ur: NEGATIVE ng/mL
Methadone Screen, Urine: NEGATIVE ng/mL
Methaqualone: NEGATIVE ng/mL
Nitrites, Initial: NEGATIVE ug/mL
Opiate Screen, Urine: NEGATIVE ng/mL
Oxycodone Screen, Ur: NEGATIVE ng/mL
Phencyclidine, Ur: NEGATIVE ng/mL
Propoxyphene: NEGATIVE ng/mL
Tapentadol, urine: NEGATIVE ng/mL
Tramadol Scrn, Ur: NEGATIVE ng/mL
Zolpidem, Urine: NEGATIVE ng/mL
pH, Initial: 7.8 pH (ref 4.5–8.9)

## 2014-11-18 LAB — HEMOGLOBINOPATHY EVALUATION
Hemoglobin Other: 0 %
Hgb A2 Quant: 2.7 % (ref 2.2–3.2)
Hgb A: 97.3 % (ref 96.8–97.8)
Hgb F Quant: 0 % (ref 0.0–2.0)
Hgb S Quant: 0 %

## 2014-11-20 ENCOUNTER — Other Ambulatory Visit: Payer: Self-pay | Admitting: Certified Nurse Midwife

## 2014-11-20 ENCOUNTER — Ambulatory Visit (HOSPITAL_COMMUNITY)
Admission: RE | Admit: 2014-11-20 | Discharge: 2014-11-20 | Disposition: A | Discharge status: Home / Self Care | Payer: Self-pay | Source: Ambulatory Visit | Attending: Certified Nurse Midwife | Admitting: Certified Nurse Midwife

## 2014-11-20 DIAGNOSIS — O09893 Supervision of other high risk pregnancies, third trimester: Secondary | ICD-10-CM

## 2014-11-20 DIAGNOSIS — Z3A32 32 weeks gestation of pregnancy: Secondary | ICD-10-CM | POA: Insufficient documentation

## 2014-11-20 DIAGNOSIS — Z1389 Encounter for screening for other disorder: Secondary | ICD-10-CM

## 2014-11-20 DIAGNOSIS — Z23 Encounter for immunization: Secondary | ICD-10-CM

## 2014-11-20 DIAGNOSIS — O0933 Supervision of pregnancy with insufficient antenatal care, third trimester: Secondary | ICD-10-CM | POA: Insufficient documentation

## 2014-11-20 DIAGNOSIS — O0930 Supervision of pregnancy with insufficient antenatal care, unspecified trimester: Secondary | ICD-10-CM

## 2014-12-01 ENCOUNTER — Ambulatory Visit (INDEPENDENT_AMBULATORY_CARE_PROVIDER_SITE_OTHER): Payer: Self-pay | Admitting: Family Medicine

## 2014-12-01 VITALS — BP 102/62 | HR 85 | Temp 98.7°F | Wt 176.0 lb

## 2014-12-01 DIAGNOSIS — O0933 Supervision of pregnancy with insufficient antenatal care, third trimester: Secondary | ICD-10-CM

## 2014-12-01 DIAGNOSIS — Z3480 Encounter for supervision of other normal pregnancy, unspecified trimester: Secondary | ICD-10-CM | POA: Insufficient documentation

## 2014-12-01 DIAGNOSIS — Z3493 Encounter for supervision of normal pregnancy, unspecified, third trimester: Secondary | ICD-10-CM

## 2014-12-01 DIAGNOSIS — O09899 Supervision of other high risk pregnancies, unspecified trimester: Secondary | ICD-10-CM | POA: Insufficient documentation

## 2014-12-01 DIAGNOSIS — Z3483 Encounter for supervision of other normal pregnancy, third trimester: Secondary | ICD-10-CM

## 2014-12-01 NOTE — Patient Instructions (Addendum)
Places to get CIRCS:    [facility] [phone] $480 by 4 wks  [facility] [phone] $244 by 4 wks  [facility] [phone] $175 by 2 wks  [facility] [phone] $250 by 7 days [facility] [phone] $150 by 4 wks  Third Trimester of Pregnancy The third trimester is from week 29 through week 42, months 7 through 9. The third trimester is a time when the fetus is growing rapidly. At the end of the ninth month, the fetus is about 20 inches in length and weighs 6-10 pounds.  BODY CHANGES Your body goes through many changes during pregnancy. The changes vary from woman to woman.   Your weight will continue to increase. You can expect to gain 25-35 pounds (11-16 kg) by the end of the pregnancy.  You may begin to get stretch marks on your hips, abdomen, and breasts.  You may urinate more often because the fetus is moving lower into your pelvis and pressing on your bladder.  You may develop or continue to have heartburn as a result of your pregnancy.  You may develop constipation because certain hormones are causing the muscles that push waste through your intestines to slow down.  You may develop hemorrhoids or swollen, bulging veins (varicose veins).  You may have pelvic pain because of the weight gain and pregnancy hormones relaxing your joints between the bones in your pelvis. Backaches may result from overexertion of the muscles supporting your posture.  You may have changes in your hair. These can include thickening of your hair, rapid growth, and changes in texture. Some women also have hair loss during or after pregnancy, or hair that feels dry or thin. Your hair will most likely return to normal after your baby is born.  Your breasts will continue to grow and be tender. A yellow discharge may leak from your breasts  called colostrum.  Your belly button may stick out.  You may feel short of breath because of your expanding uterus.  You may notice the fetus "dropping," or moving lower in your abdomen.  You may have a bloody mucus discharge. This usually occurs a few days to a week before labor begins.  Your cervix becomes thin and soft (effaced) near your due date. WHAT TO EXPECT AT YOUR PRENATAL EXAMS  You will have prenatal exams every 2 weeks until week 36. Then, you will have weekly prenatal exams. During a routine prenatal visit:  You will be weighed to make sure you and the fetus are growing normally.  Your blood pressure is taken.  Your abdomen will be measured to track your baby's growth.  The fetal heartbeat will be listened to.  Any test results from the previous visit will be discussed.  You may have a cervical check near your due date to see if you have effaced. At around 36 weeks, your caregiver will check your cervix. At the same time, your caregiver will also perform a test on the secretions of the vaginal tissue. This test is to determine if a type of bacteria, Group B streptococcus, is present. Your caregiver will explain this further. Your caregiver may ask you:  What your birth plan is.  How you are feeling.  If you are feeling the baby move.  If you have had any abnormal symptoms, such as leaking fluid, bleeding, severe headaches, or abdominal cramping.  If you have any questions. Other tests or screenings that may be performed during your third trimester include:  Blood tests that check for low  iron levels (anemia).  Fetal testing to check the health, activity level, and growth of the fetus. Testing is done if you have certain medical conditions or if there are problems during the pregnancy. FALSE LABOR You may feel small, irregular contractions that eventually go away. These are called Braxton Hicks contractions, or false labor. Contractions may last for hours,  days, or even weeks before true labor sets in. If contractions come at regular intervals, intensify, or become painful, it is best to be seen by your caregiver.  SIGNS OF LABOR   Menstrual-like cramps.  Contractions that are 5 minutes apart or less.  Contractions that start on the top of the uterus and spread down to the lower abdomen and back.  A sense of increased pelvic pressure or back pain.  A watery or bloody mucus discharge that comes from the vagina. If you have any of these signs before the 37th week of pregnancy, call your caregiver right away. You need to go to the hospital to get checked immediately. HOME CARE INSTRUCTIONS   Avoid all smoking, herbs, alcohol, and unprescribed drugs. These chemicals affect the formation and growth of the baby.  Follow your caregiver's instructions regarding medicine use. There are medicines that are either safe or unsafe to take during pregnancy.  Exercise only as directed by your caregiver. Experiencing uterine cramps is a good sign to stop exercising.  Continue to eat regular, healthy meals.  Wear a good support bra for breast tenderness.  Do not use hot tubs, steam rooms, or saunas.  Wear your seat belt at all times when driving.  Avoid raw meat, uncooked cheese, cat litter boxes, and soil used by cats. These carry germs that can cause birth defects in the baby.  Take your prenatal vitamins.  Try taking a stool softener (if your caregiver approves) if you develop constipation. Eat more high-fiber foods, such as fresh vegetables or fruit and whole grains. Drink plenty of fluids to keep your urine clear or pale yellow.  Take warm sitz baths to soothe any pain or discomfort caused by hemorrhoids. Use hemorrhoid cream if your caregiver approves.  If you develop varicose veins, wear support hose. Elevate your feet for 15 minutes, 3-4 times a day. Limit salt in your diet.  Avoid heavy lifting, wear low heal shoes, and practice good  posture.  Rest a lot with your legs elevated if you have leg cramps or low back pain.  Visit your dentist if you have not gone during your pregnancy. Use a soft toothbrush to brush your teeth and be gentle when you floss.  A sexual relationship may be continued unless your caregiver directs you otherwise.  Do not travel far distances unless it is absolutely necessary and only with the approval of your caregiver.  Take prenatal classes to understand, practice, and ask questions about the labor and delivery.  Make a trial run to the hospital.  Pack your hospital bag.  Prepare the baby's nursery.  Continue to go to all your prenatal visits as directed by your caregiver. SEEK MEDICAL CARE IF:  You are unsure if you are in labor or if your water has broken.  You have dizziness.  You have mild pelvic cramps, pelvic pressure, or nagging pain in your abdominal area.  You have persistent nausea, vomiting, or diarrhea.  You have a bad smelling vaginal discharge.  You have pain with urination. SEEK IMMEDIATE MEDICAL CARE IF:   You have a fever.  You are leaking fluid from  your vagina.  You have spotting or bleeding from your vagina.  You have severe abdominal cramping or pain.  You have rapid weight loss or gain.  You have shortness of breath with chest pain.  You notice sudden or extreme swelling of your face, hands, ankles, feet, or legs.  You have not felt your baby move in over an hour.  You have severe headaches that do not go away with medicine.  You have vision changes. Document Released: 03/21/2001 Document Revised: 04/01/2013 Document Reviewed: 05/28/2012 [facility] Patient Information 2015 [place], [place]. This information is not intended to replace advice given to you by your health care provider. Make sure you discuss any questions you have with your health care provider.

## 2014-12-01 NOTE — Progress Notes (Signed)
Subjective:  Debra Bernard is a 29 y.o. G2P1001 at [redacted]w[redacted]d being seen today for ongoing prenatal care.  Patient reports no complaints.  Contractions: Not present.  Vag. Bleeding: None. Movement: Present. Denies leaking of fluid.   The following portions of the patient's history were reviewed and updated as appropriate: allergies, current medications, past family history, past medical history, past social history, past surgical history and problem list.   Objective:   Filed Vitals:   12/01/14 1611  BP: 102/62  Pulse: 85  Temp: 98.7 F (37.1 C)  Weight: 176 lb (79.833 kg)    Fetal Status: Fetal Heart Rate (bpm): 143   Movement: Present     General:  Alert, oriented and cooperative. Patient is in no acute distress.  Skin: Skin is warm and dry. No rash noted.   Cardiovascular: Normal heart rate noted  Respiratory: Normal respiratory effort, no problems with respiration noted  Abdomen: Soft, gravid, appropriate for gestational age. Pain/Pressure: Absent     Pelvic: Vag. Bleeding: None     Cervical exam deferred        Extremities: Normal range of motion.  Edema: None  Mental Status: Normal mood and affect. Normal behavior. Normal judgment and thought content.   Urinalysis:      Assessment and Plan:  Pregnancy: G2P1001 at 100w1d  1. Limited prenatal care in third trimester - This is only #2 appt - reviewed prenatal labs thus far and is up to date  2. Supervision of normal intrauterine pregnancy in multigravida, third trimester -Updated box Patient desires Mirena IUD- will need to assure medicaid coverage vs Mirena application vs HD. Patient strongly desires to space pregnancies 3-4 years apart and would be interested in placement as early as possible after delivery. Discussed 3-4 weeks as possibility. Last pregnancy was in 2015 and this current pregnancy is unplanned.   Preterm labor symptoms and general obstetric precautions including but not limited to vaginal bleeding, contractions,  leaking of fluid and fetal movement were reviewed in detail with the patient. Please refer to After Visit Summary for other counseling recommendations.  Return in about 2 weeks (around 12/15/2014) for Routine prenatal care.   Caren Macadam, MD

## 2014-12-15 ENCOUNTER — Encounter: Payer: Self-pay | Admitting: Advanced Practice Midwife

## 2014-12-15 ENCOUNTER — Ambulatory Visit (INDEPENDENT_AMBULATORY_CARE_PROVIDER_SITE_OTHER): Payer: Self-pay | Admitting: Advanced Practice Midwife

## 2014-12-15 VITALS — BP 111/63 | HR 98 | Temp 98.4°F | Wt 176.1 lb

## 2014-12-15 DIAGNOSIS — Z113 Encounter for screening for infections with a predominantly sexual mode of transmission: Secondary | ICD-10-CM

## 2014-12-15 DIAGNOSIS — O26893 Other specified pregnancy related conditions, third trimester: Secondary | ICD-10-CM

## 2014-12-15 DIAGNOSIS — Z3493 Encounter for supervision of normal pregnancy, unspecified, third trimester: Secondary | ICD-10-CM

## 2014-12-15 DIAGNOSIS — B3731 Acute candidiasis of vulva and vagina: Secondary | ICD-10-CM

## 2014-12-15 DIAGNOSIS — Z3483 Encounter for supervision of other normal pregnancy, third trimester: Secondary | ICD-10-CM

## 2014-12-15 DIAGNOSIS — Z23 Encounter for immunization: Secondary | ICD-10-CM

## 2014-12-15 DIAGNOSIS — N898 Other specified noninflammatory disorders of vagina: Secondary | ICD-10-CM

## 2014-12-15 DIAGNOSIS — O98813 Other maternal infectious and parasitic diseases complicating pregnancy, third trimester: Secondary | ICD-10-CM

## 2014-12-15 DIAGNOSIS — Z118 Encounter for screening for other infectious and parasitic diseases: Secondary | ICD-10-CM

## 2014-12-15 DIAGNOSIS — B373 Candidiasis of vulva and vagina: Secondary | ICD-10-CM

## 2014-12-15 LAB — POCT URINALYSIS DIP (DEVICE)
BILIRUBIN URINE: NEGATIVE
Glucose, UA: NEGATIVE mg/dL
HGB URINE DIPSTICK: NEGATIVE
KETONES UR: NEGATIVE mg/dL
NITRITE: NEGATIVE
Protein, ur: NEGATIVE mg/dL
Specific Gravity, Urine: 1.01 (ref 1.005–1.030)
Urobilinogen, UA: 0.2 mg/dL (ref 0.0–1.0)
pH: 7 (ref 5.0–8.0)

## 2014-12-15 LAB — OB RESULTS CONSOLE GBS: GBS: POSITIVE

## 2014-12-15 LAB — OB RESULTS CONSOLE GC/CHLAMYDIA: Gonorrhea: NEGATIVE

## 2014-12-15 NOTE — Patient Instructions (Addendum)
Monistat  7 (over the counter)  Monilial Vaginitis Vaginitis in a soreness, swelling and redness (inflammation) of the vagina and vulva. Monilial vaginitis is not a sexually transmitted infection. CAUSES  Yeast vaginitis is caused by yeast (candida) that is normally found in your vagina. With a yeast infection, the candida has overgrown in number to a point that upsets the chemical balance. SYMPTOMS   White, thick vaginal discharge.  Swelling, itching, redness and irritation of the vagina and possibly the lips of the vagina (vulva).  Burning or painful urination.  Painful intercourse. DIAGNOSIS  Things that may contribute to monilial vaginitis are:  Postmenopausal and virginal states.  Pregnancy.  Infections.  Being tired, sick or stressed, especially if you had monilial vaginitis in the past.  Diabetes. Good control will help lower the chance.  Birth control pills.  Tight fitting garments.  Using bubble bath, feminine sprays, douches or deodorant tampons.  Taking certain medications that kill germs (antibiotics).  Sporadic recurrence can occur if you become ill. TREATMENT  Your caregiver will give you medication.  There are several kinds of anti monilial vaginal creams and suppositories specific for monilial vaginitis. For recurrent yeast infections, use a suppository or cream in the vagina 2 times a week, or as directed.  Anti-monilial or steroid cream for the itching or irritation of the vulva may also be used. Get your caregiver's permission.  Painting the vagina with methylene blue solution may help if the monilial cream does not work.  Eating yogurt may help prevent monilial vaginitis. HOME CARE INSTRUCTIONS   Finish all medication as prescribed.  Do not have sex until treatment is completed or after your caregiver tells you it is okay.  Take warm sitz baths.  Do not douche.  Do not use tampons, especially scented ones.  Wear cotton underwear.  Avoid  tight pants and panty hose.  Tell your sexual partner that you have a yeast infection. They should go to their caregiver if they have symptoms such as mild rash or itching.  Your sexual partner should be treated as well if your infection is difficult to eliminate.  Practice safer sex. Use condoms.  Some vaginal medications cause latex condoms to fail. Vaginal medications that harm condoms are:  Cleocin cream.  Butoconazole (Femstat).  Terconazole (Terazol) vaginal suppository.  Miconazole (Monistat) (may be purchased over the counter). SEEK MEDICAL CARE IF:   You have a temperature by mouth above 102 F (38.9 C).  The infection is getting worse after 2 days of treatment.  The infection is not getting better after 3 days of treatment.  You develop blisters in or around your vagina.  You develop vaginal bleeding, and it is not your menstrual period.  You have pain when you urinate.  You develop intestinal problems.  You have pain with sexual intercourse. Document Released: 01/04/2005 Document Revised: 06/19/2011 Document Reviewed: 09/18/2008 [facility] Patient Information 2015 [place], [place]. This information is not intended to replace advice given to you by your health care provider. Make sure you discuss any questions you have with your health care provider.   Braxton Hicks Contractions Contractions of the uterus can occur throughout pregnancy. Contractions are not always a sign that you are in labor.  WHAT ARE BRAXTON HICKS CONTRACTIONS?  Contractions that occur before labor are called Braxton Hicks contractions, or false labor. Toward the end of pregnancy (32-34 weeks), these contractions can develop more often and may become more forceful. This is not true labor because these contractions do not  result in opening (dilatation) and thinning of the cervix. They are sometimes difficult to tell apart from true labor because these contractions can be forceful and people have  different pain tolerances. You should not feel embarrassed if you go to the hospital with false labor. Sometimes, the only way to tell if you are in true labor is for your health care provider to look for changes in the cervix. If there are no prenatal problems or other health problems associated with the pregnancy, it is completely safe to be sent home with false labor and await the onset of true labor. HOW CAN YOU TELL THE DIFFERENCE BETWEEN TRUE AND FALSE LABOR? False Labor  The contractions of false labor are usually shorter and not as hard as those of true labor.   The contractions are usually irregular.   The contractions are often felt in the front of the lower abdomen and in the groin.   The contractions may go away when you walk around or change positions while lying down.   The contractions get weaker and are shorter lasting as time goes on.   The contractions do not usually become progressively stronger, regular, and closer together as with true labor.  True Labor  Contractions in true labor last 30-70 seconds, become very regular, usually become more intense, and increase in frequency.   The contractions do not go away with walking.   The discomfort is usually felt in the top of the uterus and spreads to the lower abdomen and low back.   True labor can be determined by your health care provider with an exam. This will show that the cervix is dilating and getting thinner.  WHAT TO REMEMBER  Keep up with your usual exercises and follow other instructions given by your health care provider.   Take medicines as directed by your health care provider.   Keep your regular prenatal appointments.   Eat and drink lightly if you think you are going into labor.   If Braxton Hicks contractions are making you uncomfortable:   Change your position from lying down or resting to walking, or from walking to resting.   Sit and rest in a tub of warm water.   Drink  2-3 glasses of water. Dehydration may cause these contractions.   Do slow and deep breathing several times an hour.  WHEN SHOULD I SEEK IMMEDIATE MEDICAL CARE? Seek immediate medical care if:  Your contractions become stronger, more regular, and closer together.   You have fluid leaking or gushing from your vagina.   You have a fever.   You pass blood-tinged mucus.   You have vaginal bleeding.   You have continuous abdominal pain.   You have low back pain that you never had before.   You feel your baby's head pushing down and causing pelvic pressure.   Your baby is not moving as much as it used to.  Document Released: 03/27/2005 Document Revised: 04/01/2013 Document Reviewed: 01/06/2013 [facility] Patient Information 2015 [place], [place]. This information is not intended to replace advice given to you by your health care provider. Make sure you discuss any questions you have with your health care provider.

## 2014-12-15 NOTE — Progress Notes (Signed)
Breastfeeding tip of the week reviewed Flu vaccine Cultures today Leukocytes: moderate

## 2014-12-15 NOTE — Progress Notes (Signed)
Subjective:  Debra Bernard is a 29 y.o. G2P1001 at [redacted]w[redacted]d being seen today for ongoing prenatal care.  Patient reports no complaints.  Contractions: Irritability.  Vag. Bleeding: None. Movement: Present. Denies leaking of fluid.   The following portions of the patient's history were reviewed and updated as appropriate: allergies, current medications, past family history, past medical history, past social history, past surgical history and problem list.   Objective:   Filed Vitals:   12/15/14 0856  BP: 111/63  Pulse: 98  Temp: 98.4 F (36.9 C)  Weight: 176 lb 1.6 oz (79.878 kg)    Fetal Status: Fetal Heart Rate (bpm): 146   Movement: Present     General:  Alert, oriented and cooperative. Patient is in no acute distress.  Skin: Skin is warm and dry. No rash noted.   Cardiovascular: Normal heart rate noted  Respiratory: Normal respiratory effort, no problems with respiration noted  Abdomen: Soft, gravid, appropriate for gestational age. Pain/Pressure: Present     Pelvic: Vag. Bleeding: None Vag D/C Character: Mucous   Cervical exam performed      0/0/ballotable, vtx, unstable Mod amount of thick, curdlike discharge  Extremities: Normal range of motion.  Edema: None  Mental Status: Normal mood and affect. Normal behavior. Normal judgment and thought content.   Urinalysis: Urine Protein: Negative Urine Glucose: Negative  Assessment and Plan:  Pregnancy: G2P1001 at [redacted]w[redacted]d  1. Prenatal care in third trimester  - Culture, beta strep (group b only) - GC/Chlamydia probe amp ([place])not at [facility]  2. Flu vaccine need  - Flu Vaccine QUAD 36+ mos IM; Standing - Flu Vaccine QUAD 36+ mos IM  3. Supervision of normal intrauterine pregnancy in multigravida, third trimester   Term labor symptoms and general obstetric precautions including but not limited to vaginal bleeding, contractions, leaking of fluid and fetal movement were reviewed in detail with the patient. Please refer to After  Visit Summary for other counseling recommendations.  Return in about 1 week (around 12/22/2014).   Manya Silvas, CNM

## 2014-12-16 LAB — GC/CHLAMYDIA PROBE AMP (~~LOC~~) NOT AT ARMC
CHLAMYDIA, DNA PROBE: NEGATIVE
NEISSERIA GONORRHEA: NEGATIVE

## 2014-12-16 LAB — WET PREP, GENITAL
TRICH WET PREP: NONE SEEN
WBC, Wet Prep HPF POC: NONE SEEN

## 2014-12-17 LAB — CULTURE, BETA STREP (GROUP B ONLY)

## 2014-12-22 ENCOUNTER — Encounter: Payer: Self-pay | Admitting: Advanced Practice Midwife

## 2014-12-22 ENCOUNTER — Ambulatory Visit (INDEPENDENT_AMBULATORY_CARE_PROVIDER_SITE_OTHER): Payer: Self-pay | Admitting: Advanced Practice Midwife

## 2014-12-22 VITALS — BP 113/65 | HR 78 | Temp 98.6°F | Wt 176.9 lb

## 2014-12-22 DIAGNOSIS — O98819 Other maternal infectious and parasitic diseases complicating pregnancy, unspecified trimester: Principal | ICD-10-CM

## 2014-12-22 DIAGNOSIS — Z3483 Encounter for supervision of other normal pregnancy, third trimester: Secondary | ICD-10-CM

## 2014-12-22 DIAGNOSIS — B951 Streptococcus, group B, as the cause of diseases classified elsewhere: Secondary | ICD-10-CM

## 2014-12-22 DIAGNOSIS — O99820 Streptococcus B carrier state complicating pregnancy: Secondary | ICD-10-CM

## 2014-12-22 LAB — POCT URINALYSIS DIP (DEVICE)
Bilirubin Urine: NEGATIVE
GLUCOSE, UA: NEGATIVE mg/dL
Hgb urine dipstick: NEGATIVE
KETONES UR: 15 mg/dL — AB
Nitrite: NEGATIVE
PH: 5.5 (ref 5.0–8.0)
PROTEIN: NEGATIVE mg/dL
UROBILINOGEN UA: 0.2 mg/dL (ref 0.0–1.0)

## 2014-12-22 NOTE — Patient Instructions (Signed)
Group B Streptococcus Infection During Pregnancy Group B streptococcus (GBS) is a type of bacteria often found in healthy women. GBS is not the same as the bacteria that causes strep throat. You may have GBS in your vagina, rectum, or bladder. GBS does not spread through sexual contact, but it can be passed to a baby during childbirth. This can be dangerous for your baby. It is not dangerous to you and usually does not cause any symptoms. Your health care provider may test you for GBS when your pregnancy is between 35 and 37 weeks. GBS is dangerous only during birth, so there is no need to test for it earlier. It is possible to have GBS during pregnancy and never pass it to your baby. If your test results are positive for GBS, your health care provider may recommend giving you antibiotic medicine during delivery to make sure your baby stays healthy. RISK FACTORS You are more likely to pass GBS to your baby if:   Your water breaks (ruptured membrane) or you go into labor before 37 weeks.  Your water breaks 18 hours before you deliver.  You passed GBS during a previous pregnancy.  You have a urinary tract infection caused by GBS any time during pregnancy.  You have a fever during labor. SYMPTOMS Most women who have GBS do not have any symptoms. If you have a urinary tract infection caused by GBS, you might have frequent or painful urination and fever. Babies who get GBS usually show symptoms within 7 days of birth. Symptoms may include:   Breathing problems.  Heart and blood pressure problems.  Digestive and kidney problems. DIAGNOSIS Routine screening for GBS is recommended for all pregnant women. A health care provider takes a sample of the fluid in your vagina and rectum with a swab. It is then sent to a lab to be checked for GBS. A sample of your urine may also be checked for the bacteria.  TREATMENT If you test positive for GBS, you may need treatment with an antibiotic medicine during  labor. As soon as you go into labor, or as soon as your membranes rupture, you will get the antibiotic medicine through an IV access. You will continue to get the medicine until after you give birth. You do not need antibiotic medicine if you are having a cesarean delivery.If your baby shows signs or symptoms of GBS after birth, your baby can also be treated with an antibiotic medicine. HOME CARE INSTRUCTIONS   Take all antibiotic medicine as prescribed by your health care provider. Only take medicine as directed.   Continue with prenatal visits and care.   Keep all follow-up appointments.  SEEK MEDICAL CARE IF:   You have pain when you urinate.   You have to urinate frequently.   You have a fever.  SEEK IMMEDIATE MEDICAL CARE IF:   Your membranes rupture.  You go into labor. Document Released: 07/04/2007 Document Revised: 04/01/2013 Document Reviewed: 01/17/2013 [employer] Patient Information 2015 [employer]. This information is not intended to replace advice given to you by your health care provider. Make sure you discuss any questions you have with your health care provider.  

## 2014-12-22 NOTE — Progress Notes (Signed)
C/o scant bright red blood when she went to bathroom once.

## 2014-12-22 NOTE — Progress Notes (Signed)
Subjective:  Debra Bernard is a 29 y.o. G2P1001 at [redacted]w[redacted]d being seen today for ongoing prenatal care.  Patient reports spotting on tissue, few contractions.  Contractions: Irregular.  Vag. Bleeding: Scant. Movement: Present. Denies leaking of fluid.   The following portions of the patient's history were reviewed and updated as appropriate: allergies, current medications, past family history, past medical history, past social history, past surgical history and problem list.   Objective:   Filed Vitals:   12/22/14 0813  BP: 113/65  Pulse: 78  Temp: 98.6 F (37 C)  Weight: 176 lb 14.4 oz (80.241 kg)    Fetal Status: Fetal Heart Rate (bpm): 142   Movement: Present     General:  Alert, oriented and cooperative. Patient is in no acute distress.  Skin: Skin is warm and dry. No rash noted.   Cardiovascular: Normal heart rate noted  Respiratory: Normal respiratory effort, no problems with respiration noted  Abdomen: Soft, gravid, appropriate for gestational age. Pain/Pressure: Absent     Pelvic: Vag. Bleeding: Scant     Cervical exam performed        Extremities: Normal range of motion.  Edema: None  Mental Status: Normal mood and affect. Normal behavior. Normal judgment and thought content.   Urinalysis: Urine Protein: Negative Urine Glucose: Negative  Assessment and Plan:  Pregnancy: G2P1001 at [redacted]w[redacted]d  1. Group B streptococcal infection during pregnancy     Informed pt of GBS + and plan for treatment Closed/40%/-3/vertex, No blood seen.   Term labor symptoms and general obstetric precautions including but not limited to vaginal bleeding, contractions, leaking of fluid and fetal movement were reviewed in detail with the patient. Reviewed where to go and process once she is in labor  Please refer to After Visit Summary for other counseling recommendations.  Follow up in clinic in one week  Seabron Spates, CNM

## 2014-12-27 ENCOUNTER — Other Ambulatory Visit: Payer: Self-pay | Admitting: Advanced Practice Midwife

## 2014-12-27 DIAGNOSIS — B373 Candidiasis of vulva and vagina: Secondary | ICD-10-CM

## 2014-12-27 DIAGNOSIS — O26893 Other specified pregnancy related conditions, third trimester: Secondary | ICD-10-CM

## 2014-12-27 DIAGNOSIS — N898 Other specified noninflammatory disorders of vagina: Secondary | ICD-10-CM

## 2014-12-27 DIAGNOSIS — B3731 Acute candidiasis of vulva and vagina: Secondary | ICD-10-CM

## 2014-12-27 MED ORDER — TERCONAZOLE 0.4 % VA CREA: 1.0000 | TOPICAL_CREAM | Freq: Every day | VAGINAL | Status: DC

## 2014-12-27 NOTE — Progress Notes (Signed)
Dx yeast infection. Rx Terazol 7.

## 2014-12-28 ENCOUNTER — Telehealth: Payer: Self-pay | Admitting: *Deleted

## 2014-12-28 NOTE — Telephone Encounter (Signed)
Called patient with test results which showed a yeast infection and informed her of prescription called in to pharmacy. Patient voiced understanding.

## 2014-12-29 ENCOUNTER — Ambulatory Visit (INDEPENDENT_AMBULATORY_CARE_PROVIDER_SITE_OTHER): Payer: Self-pay | Admitting: Family Medicine

## 2014-12-29 VITALS — BP 101/68 | HR 81 | Temp 98.7°F | Wt 176.5 lb

## 2014-12-29 DIAGNOSIS — Z3483 Encounter for supervision of other normal pregnancy, third trimester: Secondary | ICD-10-CM

## 2014-12-29 DIAGNOSIS — B951 Streptococcus, group B, as the cause of diseases classified elsewhere: Secondary | ICD-10-CM

## 2014-12-29 DIAGNOSIS — O09899 Supervision of other high risk pregnancies, unspecified trimester: Secondary | ICD-10-CM

## 2014-12-29 DIAGNOSIS — O98819 Other maternal infectious and parasitic diseases complicating pregnancy, unspecified trimester: Secondary | ICD-10-CM

## 2014-12-29 DIAGNOSIS — Z3493 Encounter for supervision of normal pregnancy, unspecified, third trimester: Secondary | ICD-10-CM

## 2014-12-29 DIAGNOSIS — O99820 Streptococcus B carrier state complicating pregnancy: Secondary | ICD-10-CM

## 2014-12-29 DIAGNOSIS — O0933 Supervision of pregnancy with insufficient antenatal care, third trimester: Secondary | ICD-10-CM

## 2014-12-29 LAB — POCT URINALYSIS DIP (DEVICE)
Bilirubin Urine: NEGATIVE
Glucose, UA: NEGATIVE mg/dL
HGB URINE DIPSTICK: NEGATIVE
Ketones, ur: NEGATIVE mg/dL
NITRITE: NEGATIVE
Protein, ur: NEGATIVE mg/dL
Specific Gravity, Urine: 1.01 (ref 1.005–1.030)
UROBILINOGEN UA: 0.2 mg/dL (ref 0.0–1.0)
pH: 6 (ref 5.0–8.0)

## 2014-12-29 NOTE — Progress Notes (Signed)
Breastfeeding tip of the week reviewed. 

## 2014-12-29 NOTE — Progress Notes (Signed)
Subjective:  Debra Bernard is a 29 y.o. G2P1001 at [redacted]w[redacted]d being seen today for ongoing prenatal care.  Patient reports occasional contractions.  Contractions: Irregular.  Vag. Bleeding: None. Movement: Present. Denies leaking of fluid.   The following portions of the patient's history were reviewed and updated as appropriate: allergies, current medications, past family history, past medical history, past social history, past surgical history and problem list.   Objective:   Filed Vitals:   12/29/14 0914  BP: 101/68  Pulse: 81  Temp: 98.7 F (37.1 C)  Weight: 176 lb 8 oz (80.06 kg)    Fetal Status: Fetal Heart Rate (bpm): 156   Movement: Present     General:  Alert, oriented and cooperative. Patient is in no acute distress.  Skin: Skin is warm and dry. No rash noted.   Cardiovascular: Normal heart rate noted  Respiratory: Normal respiratory effort, no problems with respiration noted  Abdomen: Soft, gravid, appropriate for gestational age. Pain/Pressure: Present     Pelvic: Vag. Bleeding: None     Cervical exam deferred        Extremities: Normal range of motion.  Edema: None  Mental Status: Normal mood and affect. Normal behavior. Normal judgment and thought content.   Urinalysis: Urine Protein: Negative Urine Glucose: Negative  Assessment and Plan:  Pregnancy: G2P1001 at [redacted]w[redacted]d  1. Limited prenatal care in third trimester  2. Supervision of normal intrauterine pregnancy in multigravida, third trimester -updated box -Desires IUD, no insurance. Has patient meet with financial today for pricing at our office and discussed placement at [facility] -consider sweeping at next visit if appropriately dilated.   3. Short interval between pregnancies affecting pregnancy, antepartum  4. Group B streptococcal infection during pregnancy PCN in labor  Term labor symptoms and general obstetric precautions including but not limited to vaginal bleeding, contractions, leaking of fluid and fetal  movement were reviewed in detail with the patient. Please refer to After Visit Summary for other counseling recommendations.  Return in about 1 week (around 01/05/2015) for Routine prenatal care.   Caren Macadam, MD

## 2014-12-29 NOTE — Patient Instructions (Signed)
Places to get CIRCUMCISION:    [facility] [phone] $[phone] by 4 wks  [facility] [phone] $244 by 4 wks  [facility] [phone] $175 by 2 wks  [facility] [phone] $250 by 7 days [facility] [phone] $150 by 4 wks- family practice   Third Trimester of Pregnancy The third trimester is from week 29 through week 42, months 7 through 9. The third trimester is a time when the fetus is growing rapidly. At the end of the ninth month, the fetus is about 20 inches in length and weighs 6-10 pounds.  BODY CHANGES Your body goes through many changes during pregnancy. The changes vary from woman to woman.   Your weight will continue to increase. You can expect to gain 25-35 pounds (11-16 kg) by the end of the pregnancy.  You may begin to get stretch marks on your hips, abdomen, and breasts.  You may urinate more often because the fetus is moving lower into your pelvis and pressing on your bladder.  You may develop or continue to have heartburn as a result of your pregnancy.  You may develop constipation because certain hormones are causing the muscles that push waste through your intestines to slow down.  You may develop hemorrhoids or swollen, bulging veins (varicose veins).  You may have pelvic pain because of the weight gain and pregnancy hormones relaxing your joints between the bones in your pelvis. Backaches may result from overexertion of the muscles supporting your posture.  You may have changes in your hair. These can include thickening of your hair, rapid growth, and changes in texture. Some women also have hair loss during or after pregnancy, or hair that feels dry or thin. Your hair will most likely return to normal after your baby is born.  Your breasts will continue to grow and be tender. A yellow discharge may  leak from your breasts called colostrum.  Your belly button may stick out.  You may feel short of breath because of your expanding uterus.  You may notice the fetus "dropping," or moving lower in your abdomen.  You may have a bloody mucus discharge. This usually occurs a few days to a week before labor begins.  Your cervix becomes thin and soft (effaced) near your due date. WHAT TO EXPECT AT YOUR PRENATAL EXAMS  You will have prenatal exams every 2 weeks until week 36. Then, you will have weekly prenatal exams. During a routine prenatal visit:  You will be weighed to make sure you and the fetus are growing normally.  Your blood pressure is taken.  Your abdomen will be measured to track your baby's growth.  The fetal heartbeat will be listened to.  Any test results from the previous visit will be discussed.  You may have a cervical check near your due date to see if you have effaced. At around 36 weeks, your caregiver will check your cervix. At the same time, your caregiver will also perform a test on the secretions of the vaginal tissue. This test is to determine if a type of bacteria, Group B streptococcus, is present. Your caregiver will explain this further. Your caregiver may ask you:  What your birth plan is.  How you are feeling.  If you are feeling the baby move.  If you have had any abnormal symptoms, such as leaking fluid, bleeding, severe headaches, or abdominal cramping.  If you have any questions. Other tests or screenings that may be performed during your third trimester include:  Blood tests that  check for low iron levels (anemia).  Fetal testing to check the health, activity level, and growth of the fetus. Testing is done if you have certain medical conditions or if there are problems during the pregnancy. FALSE LABOR You may feel small, irregular contractions that eventually go away. These are called Braxton Hicks contractions, or false labor. Contractions  may last for hours, days, or even weeks before true labor sets in. If contractions come at regular intervals, intensify, or become painful, it is best to be seen by your caregiver.  SIGNS OF LABOR   Menstrual-like cramps.  Contractions that are 5 minutes apart or less.  Contractions that start on the top of the uterus and spread down to the lower abdomen and back.  A sense of increased pelvic pressure or back pain.  A watery or bloody mucus discharge that comes from the vagina. If you have any of these signs before the 37th week of pregnancy, call your caregiver right away. You need to go to the hospital to get checked immediately. HOME CARE INSTRUCTIONS   Avoid all smoking, herbs, alcohol, and unprescribed drugs. These chemicals affect the formation and growth of the baby.  Follow your caregiver's instructions regarding medicine use. There are medicines that are either safe or unsafe to take during pregnancy.  Exercise only as directed by your caregiver. Experiencing uterine cramps is a good sign to stop exercising.  Continue to eat regular, healthy meals.  Wear a good support bra for breast tenderness.  Do not use hot tubs, steam rooms, or saunas.  Wear your seat belt at all times when driving.  Avoid raw meat, uncooked cheese, cat litter boxes, and soil used by cats. These carry germs that can cause birth defects in the baby.  Take your prenatal vitamins.  Try taking a stool softener (if your caregiver approves) if you develop constipation. Eat more high-fiber foods, such as fresh vegetables or fruit and whole grains. Drink plenty of fluids to keep your urine clear or pale yellow.  Take warm sitz baths to soothe any pain or discomfort caused by hemorrhoids. Use hemorrhoid cream if your caregiver approves.  If you develop varicose veins, wear support hose. Elevate your feet for 15 minutes, 3-4 times a day. Limit salt in your diet.  Avoid heavy lifting, wear low heal shoes,  and practice good posture.  Rest a lot with your legs elevated if you have leg cramps or low back pain.  Visit your dentist if you have not gone during your pregnancy. Use a soft toothbrush to brush your teeth and be gentle when you floss.  A sexual relationship may be continued unless your caregiver directs you otherwise.  Do not travel far distances unless it is absolutely necessary and only with the approval of your caregiver.  Take prenatal classes to understand, practice, and ask questions about the labor and delivery.  Make a trial run to the hospital.  Pack your hospital bag.  Prepare the baby's nursery.  Continue to go to all your prenatal visits as directed by your caregiver. SEEK MEDICAL CARE IF:  You are unsure if you are in labor or if your water has broken.  You have dizziness.  You have mild pelvic cramps, pelvic pressure, or nagging pain in your abdominal area.  You have persistent nausea, vomiting, or diarrhea.  You have a bad smelling vaginal discharge.  You have pain with urination. SEEK IMMEDIATE MEDICAL CARE IF:   You have a fever.  You are  leaking fluid from your vagina.  You have spotting or bleeding from your vagina.  You have severe abdominal cramping or pain.  You have rapid weight loss or gain.  You have shortness of breath with chest pain.  You notice sudden or extreme swelling of your face, hands, ankles, feet, or legs.  You have not felt your baby move in over an hour.  You have severe headaches that do not go away with medicine.  You have vision changes. Document Released: 03/21/2001 Document Revised: 04/01/2013 Document Reviewed: 05/28/2012 [facility] Patient Information 2015 [place], [place]. This information is not intended to replace advice given to you by your health care provider. Make sure you discuss any questions you have with your health care provider.

## 2015-01-05 ENCOUNTER — Ambulatory Visit (INDEPENDENT_AMBULATORY_CARE_PROVIDER_SITE_OTHER): Payer: Self-pay | Admitting: Family

## 2015-01-05 VITALS — BP 118/65 | HR 83 | Temp 98.6°F | Wt 179.9 lb

## 2015-01-05 DIAGNOSIS — Z3483 Encounter for supervision of other normal pregnancy, third trimester: Secondary | ICD-10-CM

## 2015-01-05 DIAGNOSIS — Z3493 Encounter for supervision of normal pregnancy, unspecified, third trimester: Secondary | ICD-10-CM

## 2015-01-05 LAB — POCT URINALYSIS DIP (DEVICE)
BILIRUBIN URINE: NEGATIVE
GLUCOSE, UA: NEGATIVE mg/dL
HGB URINE DIPSTICK: NEGATIVE
Ketones, ur: NEGATIVE mg/dL
LEUKOCYTES UA: NEGATIVE
NITRITE: NEGATIVE
Protein, ur: NEGATIVE mg/dL
Specific Gravity, Urine: 1.015 (ref 1.005–1.030)
Urobilinogen, UA: 0.2 mg/dL (ref 0.0–1.0)
pH: 7 (ref 5.0–8.0)

## 2015-01-05 NOTE — Progress Notes (Signed)
Subjective:  Debra Bernard is a 29 y.o. G2P1001 at [redacted]w[redacted]d being seen today for ongoing prenatal care.  Patient reports no complaints.  Contractions: Not present.  Vag. Bleeding: None. Movement: Present. Denies leaking of fluid.   The following portions of the patient's history were reviewed and updated as appropriate: allergies, current medications, past family history, past medical history, past social history, past surgical history and problem list.   Objective:   Filed Vitals:   01/05/15 1024  BP: 118/65  Pulse: 83  Temp: 98.6 F (37 C)  Weight: 179 lb 14.4 oz (81.602 kg)    Fetal Status: Fetal Heart Rate (bpm): 148 Fundal Height: 37 cm Movement: Present  Presentation: Vertex  General:  Alert, oriented and cooperative. Patient is in no acute distress.  Skin: Skin is warm and dry. No rash noted.   Cardiovascular: Normal heart rate noted  Respiratory: Normal respiratory effort, no problems with respiration noted  Abdomen: Soft, gravid, appropriate for gestational age. Pain/Pressure: Present     Pelvic: Vag. Bleeding: None     Cervical exam performed Dilation: Closed Effacement (%): 40    Extremities: Normal range of motion.  Edema: None  Mental Status: Normal mood and affect. Normal behavior. Normal judgment and thought content.   Urinalysis: Urine Protein: Negative Urine Glucose: Negative  Assessment and Plan:  Pregnancy: G2P1001 at [redacted]w[redacted]d  1. Supervision of normal intrauterine pregnancy in multigravida, third trimester - NST next visit  Term labor symptoms and general obstetric precautions including but not limited to vaginal bleeding, contractions, leaking of fluid and fetal movement were reviewed in detail with the patient. Please refer to After Visit Summary for other counseling recommendations.  Return in about 1 week (around 01/12/2015) for appt and NST.   Venia Carbon Michiel Cowboy, CNM

## 2015-01-05 NOTE — Progress Notes (Signed)
Breastfeeding tip of the week reviewed. 

## 2015-01-13 ENCOUNTER — Other Ambulatory Visit: Payer: Self-pay | Admitting: Obstetrics and Gynecology

## 2015-01-13 ENCOUNTER — Ambulatory Visit (INDEPENDENT_AMBULATORY_CARE_PROVIDER_SITE_OTHER): Payer: Self-pay | Admitting: Obstetrics and Gynecology

## 2015-01-13 ENCOUNTER — Ambulatory Visit (HOSPITAL_COMMUNITY)
Admission: RE | Admit: 2015-01-13 | Discharge: 2015-01-13 | Disposition: A | Discharge status: Home / Self Care | Payer: Self-pay | Source: Ambulatory Visit | Attending: Obstetrics and Gynecology | Admitting: Obstetrics and Gynecology

## 2015-01-13 VITALS — BP 121/64 | HR 102 | Wt 179.1 lb

## 2015-01-13 DIAGNOSIS — O480 Post-term pregnancy: Secondary | ICD-10-CM

## 2015-01-13 DIAGNOSIS — Z3A40 40 weeks gestation of pregnancy: Secondary | ICD-10-CM

## 2015-01-13 DIAGNOSIS — O0933 Supervision of pregnancy with insufficient antenatal care, third trimester: Secondary | ICD-10-CM

## 2015-01-13 DIAGNOSIS — O99820 Streptococcus B carrier state complicating pregnancy: Secondary | ICD-10-CM

## 2015-01-13 LAB — POCT URINALYSIS DIP (DEVICE)
GLUCOSE, UA: NEGATIVE mg/dL
Hgb urine dipstick: NEGATIVE
Ketones, ur: 15 mg/dL — AB
NITRITE: NEGATIVE
PH: 6.5 (ref 5.0–8.0)
PROTEIN: 30 mg/dL — AB
Specific Gravity, Urine: 1.025 (ref 1.005–1.030)
UROBILINOGEN UA: 1 mg/dL (ref 0.0–1.0)

## 2015-01-13 NOTE — Progress Notes (Signed)
Breastfeeding tip of the week reviewed NST/OBF Schedule AFI/Induction Bilirubin: small,Ketones: 15, Leukocytes: small

## 2015-01-13 NOTE — Progress Notes (Signed)
Subjective:  Debra Bernard is a 29 y.o. G2P1001 at [redacted]w[redacted]d being seen today for ongoing prenatal care.  Patient reports no complaints. Does have irregular contractions.  Contractions: Irregular.  Vag. Bleeding: None. Movement: Present. Denies leaking of fluid.   The following portions of the patient's history were reviewed and updated as appropriate: allergies, current medications, past family history, past medical history, past social history, past surgical history and problem list.   Objective:   Filed Vitals:   01/13/15 1051  BP: 121/64  Pulse: 102  Weight: 179 lb 1.6 oz (81.239 kg)    Fetal Status: Fetal Heart Rate (bpm): NST   Movement: Present  Presentation: Vertex  General:  Alert, oriented and cooperative. Patient is in no acute distress.  Skin: Skin is warm and dry. No rash noted.   Cardiovascular: Normal heart rate noted  Respiratory: Normal respiratory effort, no problems with respiration noted  Abdomen: Soft, gravid, appropriate for gestational age. Pain/Pressure: Present     Pelvic: Vag. Bleeding: None     Cervical exam performed Dilation: Fingertip Effacement (%): 40 Station: Ballotable0.5/40/ballotable  Extremities: Normal range of motion.  Edema: None  Mental Status: Normal mood and affect. Normal behavior. Normal judgment and thought content.   Urinalysis: Urine Protein: 1+ Urine Glucose: Negative  Assessment and Plan:  Pregnancy: G2P1001 at [redacted]w[redacted]d  1. Post-term pregnancy, 40-42 weeks of gestation - Fetal nonstress test reactive today, took about 40 minutes to get reactive, AFI today is pending - [place] MFM OB LIMITED; Future - induction scheduled for 10/10 - decreased fetal mvmt return precautions  Term labor symptoms and general obstetric precautions including but not limited to vaginal bleeding, contractions, leaking of fluid and fetal movement were reviewed in detail with the patient. Please refer to After Visit Summary for other counseling recommendations.     Gwynne Edinger, MD

## 2015-01-15 ENCOUNTER — Telehealth (HOSPITAL_COMMUNITY): Payer: Self-pay | Admitting: *Deleted

## 2015-01-15 ENCOUNTER — Encounter (HOSPITAL_COMMUNITY): Payer: Self-pay | Admitting: *Deleted

## 2015-01-15 NOTE — Telephone Encounter (Signed)
Preadmission screen  

## 2015-01-15 NOTE — Telephone Encounter (Signed)
Interpreter number [phone]

## 2015-01-16 ENCOUNTER — Encounter (HOSPITAL_COMMUNITY): Payer: Self-pay | Admitting: *Deleted

## 2015-01-16 ENCOUNTER — Inpatient Hospital Stay (HOSPITAL_COMMUNITY): Payer: Medicaid Other | Admitting: Anesthesiology

## 2015-01-16 ENCOUNTER — Inpatient Hospital Stay (HOSPITAL_COMMUNITY)
Admission: AD | Admit: 2015-01-16 | Discharge: 2015-01-18 | DRG: 775 | Disposition: A | Discharge status: Home / Self Care | Payer: Medicaid Other | Source: Ambulatory Visit | Attending: Obstetrics and Gynecology | Admitting: Obstetrics and Gynecology

## 2015-01-16 DIAGNOSIS — Z8349 Family history of other endocrine, nutritional and metabolic diseases: Secondary | ICD-10-CM | POA: Diagnosis not present

## 2015-01-16 DIAGNOSIS — Z813 Family history of other psychoactive substance abuse and dependence: Secondary | ICD-10-CM | POA: Diagnosis not present

## 2015-01-16 DIAGNOSIS — O98819 Other maternal infectious and parasitic diseases complicating pregnancy, unspecified trimester: Secondary | ICD-10-CM

## 2015-01-16 DIAGNOSIS — Z3A40 40 weeks gestation of pregnancy: Secondary | ICD-10-CM

## 2015-01-16 DIAGNOSIS — Z818 Family history of other mental and behavioral disorders: Secondary | ICD-10-CM

## 2015-01-16 DIAGNOSIS — Z810 Family history of intellectual disabilities: Secondary | ICD-10-CM | POA: Diagnosis not present

## 2015-01-16 DIAGNOSIS — O99824 Streptococcus B carrier state complicating childbirth: Secondary | ICD-10-CM

## 2015-01-16 DIAGNOSIS — Z828 Family history of other disabilities and chronic diseases leading to disablement, not elsewhere classified: Secondary | ICD-10-CM | POA: Diagnosis not present

## 2015-01-16 DIAGNOSIS — Z833 Family history of diabetes mellitus: Secondary | ICD-10-CM

## 2015-01-16 DIAGNOSIS — O09899 Supervision of other high risk pregnancies, unspecified trimester: Secondary | ICD-10-CM

## 2015-01-16 DIAGNOSIS — Z8249 Family history of ischemic heart disease and other diseases of the circulatory system: Secondary | ICD-10-CM

## 2015-01-16 DIAGNOSIS — Z823 Family history of stroke: Secondary | ICD-10-CM | POA: Diagnosis not present

## 2015-01-16 DIAGNOSIS — O480 Post-term pregnancy: Secondary | ICD-10-CM | POA: Diagnosis present

## 2015-01-16 DIAGNOSIS — B951 Streptococcus, group B, as the cause of diseases classified elsewhere: Secondary | ICD-10-CM

## 2015-01-16 DIAGNOSIS — Z811 Family history of alcohol abuse and dependence: Secondary | ICD-10-CM

## 2015-01-16 DIAGNOSIS — Z822 Family history of deafness and hearing loss: Secondary | ICD-10-CM

## 2015-01-16 DIAGNOSIS — Z8279 Family history of other congenital malformations, deformations and chromosomal abnormalities: Secondary | ICD-10-CM

## 2015-01-16 DIAGNOSIS — Z821 Family history of blindness and visual loss: Secondary | ICD-10-CM | POA: Diagnosis not present

## 2015-01-16 DIAGNOSIS — Z8261 Family history of arthritis: Secondary | ICD-10-CM | POA: Diagnosis not present

## 2015-01-16 DIAGNOSIS — Z841 Family history of disorders of kidney and ureter: Secondary | ICD-10-CM

## 2015-01-16 DIAGNOSIS — O0933 Supervision of pregnancy with insufficient antenatal care, third trimester: Secondary | ICD-10-CM

## 2015-01-16 DIAGNOSIS — IMO0001 Reserved for inherently not codable concepts without codable children: Secondary | ICD-10-CM

## 2015-01-16 DIAGNOSIS — Z825 Family history of asthma and other chronic lower respiratory diseases: Secondary | ICD-10-CM

## 2015-01-16 DIAGNOSIS — Z3483 Encounter for supervision of other normal pregnancy, third trimester: Secondary | ICD-10-CM

## 2015-01-16 LAB — CBC
HCT: 38.4 % (ref 36.0–46.0)
Hemoglobin: 13.2 g/dL (ref 12.0–15.0)
MCH: 29.9 pg (ref 26.0–34.0)
MCHC: 34.4 g/dL (ref 30.0–36.0)
MCV: 86.9 fL (ref 78.0–100.0)
PLATELETS: 195 10*3/uL (ref 150–400)
RBC: 4.42 MIL/uL (ref 3.87–5.11)
RDW: 14.2 % (ref 11.5–15.5)
WBC: 9.6 10*3/uL (ref 4.0–10.5)

## 2015-01-16 LAB — TYPE AND SCREEN
ABO/RH(D): O POS
ANTIBODY SCREEN: NEGATIVE

## 2015-01-16 LAB — RPR: RPR Ser Ql: NONREACTIVE

## 2015-01-16 MED ORDER — FENTANYL CITRATE (PF) 100 MCG/2ML IJ SOLN
50.0000 ug | INTRAMUSCULAR | Status: DC | PRN
  Administered 2015-01-16: 100 ug via INTRAVENOUS

## 2015-01-16 MED ORDER — IBUPROFEN 600 MG PO TABS
600.0000 mg | ORAL_TABLET | Freq: Four times a day (QID) | ORAL | Status: DC
  Administered 2015-01-16 – 2015-01-18 (×8): 600 mg via ORAL
  Filled 2015-01-16 (×8): qty 1

## 2015-01-16 MED ORDER — PRENATAL MULTIVITAMIN CH
1.0000 | ORAL_TABLET | Freq: Every day | ORAL | Status: DC
  Administered 2015-01-17 – 2015-01-18 (×2): 1 via ORAL
  Filled 2015-01-16 (×2): qty 1

## 2015-01-16 MED ORDER — FENTANYL 2.5 MCG/ML BUPIVACAINE 1/10 % EPIDURAL INFUSION (WH - ANES)
14.0000 mL/h | INTRAMUSCULAR | Status: DC | PRN
  Administered 2015-01-16: 14 mL/h via EPIDURAL
  Filled 2015-01-16: qty 125

## 2015-01-16 MED ORDER — LIDOCAINE HCL (PF) 1 % IJ SOLN
30.0000 mL | INTRAMUSCULAR | Status: DC | PRN
  Filled 2015-01-16: qty 30

## 2015-01-16 MED ORDER — OXYTOCIN BOLUS FROM INFUSION: 500.0000 mL | INTRAVENOUS | Status: DC

## 2015-01-16 MED ORDER — WITCH HAZEL-GLYCERIN EX PADS: 1.0000 "application " | MEDICATED_PAD | CUTANEOUS | Status: DC | PRN

## 2015-01-16 MED ORDER — OXYCODONE-ACETAMINOPHEN 5-325 MG PO TABS: 2.0000 | ORAL_TABLET | ORAL | Status: DC | PRN

## 2015-01-16 MED ORDER — ONDANSETRON HCL 4 MG/2ML IJ SOLN: 4.0000 mg | INTRAMUSCULAR | Status: DC | PRN

## 2015-01-16 MED ORDER — SENNOSIDES-DOCUSATE SODIUM 8.6-50 MG PO TABS
2.0000 | ORAL_TABLET | ORAL | Status: DC
Start: 2015-01-17 — End: 2015-01-18
  Administered 2015-01-16 – 2015-01-17 (×2): 2 via ORAL
  Filled 2015-01-16 (×2): qty 2

## 2015-01-16 MED ORDER — ONDANSETRON HCL 4 MG/2ML IJ SOLN
4.0000 mg | Freq: Four times a day (QID) | INTRAMUSCULAR | Status: DC | PRN
  Administered 2015-01-16: 4 mg via INTRAVENOUS
  Filled 2015-01-16: qty 2

## 2015-01-16 MED ORDER — BENZOCAINE-MENTHOL 20-0.5 % EX AERO
1.0000 "application " | INHALATION_SPRAY | CUTANEOUS | Status: DC | PRN
  Filled 2015-01-16: qty 56

## 2015-01-16 MED ORDER — DIPHENHYDRAMINE HCL 50 MG/ML IJ SOLN: 12.5000 mg | INTRAMUSCULAR | Status: DC | PRN

## 2015-01-16 MED ORDER — OXYTOCIN 40 UNITS IN LACTATED RINGERS INFUSION - SIMPLE MED
62.5000 mL/h | INTRAVENOUS | Status: DC
  Administered 2015-01-16: 62.5 mL/h via INTRAVENOUS
  Filled 2015-01-16: qty 1000

## 2015-01-16 MED ORDER — DIPHENHYDRAMINE HCL 25 MG PO CAPS: 25.0000 mg | ORAL_CAPSULE | Freq: Four times a day (QID) | ORAL | Status: DC | PRN

## 2015-01-16 MED ORDER — ZOLPIDEM TARTRATE 5 MG PO TABS: 5.0000 mg | ORAL_TABLET | Freq: Every evening | ORAL | Status: DC | PRN

## 2015-01-16 MED ORDER — LACTATED RINGERS IV SOLN
500.0000 mL | INTRAVENOUS | Status: DC | PRN
  Administered 2015-01-16: 500 mL via INTRAVENOUS

## 2015-01-16 MED ORDER — FENTANYL CITRATE (PF) 100 MCG/2ML IJ SOLN
INTRAMUSCULAR | Status: AC
  Filled 2015-01-16: qty 2

## 2015-01-16 MED ORDER — ACETAMINOPHEN 325 MG PO TABS: 650.0000 mg | ORAL_TABLET | ORAL | Status: DC | PRN

## 2015-01-16 MED ORDER — LIDOCAINE HCL (PF) 1 % IJ SOLN
INTRAMUSCULAR | Status: DC | PRN
  Administered 2015-01-16 (×2): 4 mL

## 2015-01-16 MED ORDER — FLEET ENEMA 7-19 GM/118ML RE ENEM: 1.0000 | ENEMA | RECTAL | Status: DC | PRN

## 2015-01-16 MED ORDER — PHENYLEPHRINE 40 MCG/ML (10ML) SYRINGE FOR IV PUSH (FOR BLOOD PRESSURE SUPPORT)
80.0000 ug | PREFILLED_SYRINGE | INTRAVENOUS | Status: DC | PRN
  Filled 2015-01-16: qty 20

## 2015-01-16 MED ORDER — PENICILLIN G POTASSIUM 5000000 UNITS IJ SOLR
2.5000 10*6.[IU] | INTRAVENOUS | Status: DC
  Filled 2015-01-16 (×5): qty 2.5

## 2015-01-16 MED ORDER — OXYCODONE-ACETAMINOPHEN 5-325 MG PO TABS: 1.0000 | ORAL_TABLET | ORAL | Status: DC | PRN

## 2015-01-16 MED ORDER — CITRIC ACID-SODIUM CITRATE 334-500 MG/5ML PO SOLN: 30.0000 mL | ORAL | Status: DC | PRN

## 2015-01-16 MED ORDER — SIMETHICONE 80 MG PO CHEW
80.0000 mg | CHEWABLE_TABLET | ORAL | Status: DC | PRN
Start: 2015-01-16 — End: 2015-01-18

## 2015-01-16 MED ORDER — EPHEDRINE 5 MG/ML INJ: 10.0000 mg | INTRAVENOUS | Status: DC | PRN

## 2015-01-16 MED ORDER — PENICILLIN G POTASSIUM 5000000 UNITS IJ SOLR
5.0000 10*6.[IU] | Freq: Once | INTRAVENOUS | Status: AC
  Administered 2015-01-16: 5 10*6.[IU] via INTRAVENOUS
  Filled 2015-01-16: qty 5

## 2015-01-16 MED ORDER — DIBUCAINE 1 % RE OINT: 1.0000 "application " | TOPICAL_OINTMENT | RECTAL | Status: DC | PRN

## 2015-01-16 MED ORDER — LACTATED RINGERS IV SOLN
INTRAVENOUS | Status: DC
  Administered 2015-01-16 (×2): via INTRAVENOUS

## 2015-01-16 MED ORDER — LANOLIN HYDROUS EX OINT: TOPICAL_OINTMENT | CUTANEOUS | Status: DC | PRN

## 2015-01-16 MED ORDER — ONDANSETRON HCL 4 MG PO TABS: 4.0000 mg | ORAL_TABLET | ORAL | Status: DC | PRN

## 2015-01-16 MED ORDER — TETANUS-DIPHTH-ACELL PERTUSSIS 5-2.5-18.5 LF-MCG/0.5 IM SUSP: 0.5000 mL | Freq: Once | INTRAMUSCULAR | Status: DC

## 2015-01-16 NOTE — H&P (Signed)
Chief Complaint:  Labor Eval  HPI: Debra Bernard is a 29 y.o. G2P1001 at [redacted]w[redacted]d who presents to maternity admissions reporting painful/uncomfortable contractions which started this morning. Was noted to have significant cervical change w/ dilation to 4.5cm. Denies leakage of fluid or vaginal bleeding. Good fetal movement.   Pregnancy Course:  Clinic  [facility] Prenatal Labs  Dating  LMP Blood type: O/POS/-- (08/08 1457)   Genetic Screen Too late Antibody:NEG (08/08 1457)  Anatomic [place]  female, normal. Fundal placenta Rubella: 1.10 (08/08 1457)  GTT Early:               Third trimester: 99 RPR: NON REAC (08/08 1457)   Flu vaccine 12/15/14 HBsAg: NEGATIVE (08/08 1457)   TDaP vaccine 11/16/2014                                  Rhogam: NA HIV: NONREACTIVE (08/08 1457)   Baby Food Breast and bottle                                         GBS: (For PCN allergy, check sensitivities) Pos  Contraception  IUD- Mirena Pap:  March 2015 Negative  Circumcision  desires, gave list of [employer] [facility] for children   Support Person  FOB  nd Aunts     Past Medical History: Past Medical History  Diagnosis Date  . Medical history non-contributory     Past obstetric history: OB History  Gravida Para Term Preterm AB SAB TAB Ectopic Multiple Living  2 1 1  0 0 0 0 0 0 1    # Outcome Date GA Lbr Len/2nd Weight Sex Delivery Anes PTL Lv  2 Current           1 Term 09/17/13 [redacted]w[redacted]d 12:37 / 00:31 3.36 kg (7 lb 6.5 oz) M Vag-Spont EPI  Y      Past Surgical History: Past Surgical History  Procedure Laterality Date  . No past surgeries       Family History: Family History  Problem Relation Age of Onset  . Alcohol abuse Neg Hx   . Arthritis Neg Hx   . Asthma Neg Hx   . Birth defects Neg Hx   . Cancer Neg Hx   . COPD Neg Hx   . Depression Neg Hx   . Diabetes Neg Hx   . Drug abuse Neg Hx   . Early death Neg Hx   . Hearing loss Neg Hx   . Hyperlipidemia Neg Hx   . Heart disease Neg Hx    . Hypertension Neg Hx   . Kidney disease Neg Hx   . Learning disabilities Neg Hx   . Mental illness Neg Hx   . Mental retardation Neg Hx   . Miscarriages / Stillbirths Neg Hx   . Stroke Neg Hx   . Vision loss Neg Hx     Social History: Social History  Substance Use Topics  . Smoking status: Never Smoker   . Smokeless tobacco: Never Used  . Alcohol Use: No    Allergies: No Known Allergies  Meds:  Prescriptions prior to admission  Medication Sig Dispense Refill Last Dose  . terconazole (TERAZOL 7) 0.4 % vaginal cream Place 1 applicator vaginally at bedtime. x 7 nights (Patient not taking: Reported  on 01/16/2015) 45 g 0 Taking    ROS: Pertinent findings in history of present illness.  Physical Exam  Blood pressure 111/66, pulse 91, temperature 98.7 F (37.1 C), temperature source Oral, resp. rate 17, height 5\' 5"  (1.651 m), weight 81.194 kg (179 lb), last menstrual period 04/06/2014, currently breastfeeding. GENERAL: Well-developed, well-nourished female in no acute distress.  HEENT: normocephalic HEART: normal rate RESP: normal effort ABDOMEN: Soft, non-tender, gravid appropriate for gestational age EXTREMITIES: Nontender, no edema NEURO: alert and oriented  Dilation: 4.5 Effacement (%): 80 Cervical Position: Anterior Station: -2 Presentation: Vertex Exam by:: Lavonna Rua, RNC  FHT:  Baseline 135, moderate variability, accelerations present, no decelerations Contractions: q 3-6 mins   Labs: No results found for this or any previous visit (from the past 24 hour(s)).  Imaging:  [place] Mfm Ob Limited  01/13/2015   OBSTETRICAL ULTRASOUND: This exam was performed within a [facility]. The OB US report was generated in the AS system, and faxed to the ordering physician.   This report is available in the [facility]. See the AS Obstetric US report via the Image Link.  Assessment: 1. Labor: active 2. Fetal Wellbeing: Category I  3. Pain Control:  Epidural, fentanyl x 1 4. GBS: positive 5. 40.5 week IUP  Plan:  1. Admit to BS per consult with MD 2. Routine L&D orders 3. Analgesia/anesthesia PRN      Medication List    ASK your doctor about these medications        terconazole 0.4 % vaginal cream  Commonly known as:  TERAZOL 7  Place 1 applicator vaginally at bedtime. x 7 nights        Elberta Leatherwood, MD 01/16/2015 8:22 AM

## 2015-01-16 NOTE — Progress Notes (Signed)
Debra Bernard is a 29 y.o. G2P1001 at [redacted]w[redacted]d admitted for active labor  Subjective: Comfortable with epidural  Objective: BP 108/69 mmHg  Pulse 63  Temp(Src) 98.3 F (36.8 C) (Oral)  Resp 16  Ht 5\' 5"  (1.651 m)  Wt 81.194 kg (179 lb)  BMI 29.79 kg/m2  SpO2 100%  LMP 04/06/2014      FHT:  Cat 1 UC:   regular, every 3-5 minutes SVE:   7-8/80/-2 AROM- Clear Fluid Labs:  Lab Results  Component Value Date   WBC 9.6 01/16/2015   HGB 13.2 01/16/2015   HCT 38.4 01/16/2015   MCV 86.9 01/16/2015   PLT 195 01/16/2015    Assessment / Plan: Spontaneous labor, progressing normally  Labor: Progressing normally Preeclampsia:   Fetal Wellbeing:  Category I Pain Control:  Epidural I/D:  GBS Positive Anticipated MOD:  NSVD  Debra Bernard 01/16/2015, 11:54 AM

## 2015-01-16 NOTE — Anesthesia Procedure Notes (Signed)
Epidural Patient location during procedure: OB  Staffing Anesthesiologist: Izaiha Lo Performed by: anesthesiologist   Preanesthetic Checklist Completed: patient identified, site marked, surgical consent, pre-op evaluation, timeout performed, IV checked, risks and benefits discussed and monitors and equipment checked  Epidural Patient position: sitting Prep: site prepped and draped and DuraPrep Patient monitoring: continuous pulse ox and blood pressure Approach: midline Location: L3-L4 Injection technique: LOR saline  Needle:  Needle type: Tuohy  Needle gauge: 17 G Needle length: 9 cm and 9 Needle insertion depth: 7 cm Catheter type: closed end flexible Catheter size: 19 Gauge Catheter at skin depth: 12 cm Test dose: negative  Assessment Events: blood not aspirated, injection not painful, no injection resistance, negative IV test and no paresthesia  Additional Notes Patient identified. Risks/Benefits/Options discussed with patient including but not limited to bleeding, infection, nerve damage, paralysis, failed block, incomplete pain control, headache, blood pressure changes, nausea, vomiting, reactions to medication both or allergic, itching and postpartum back pain. Confirmed with bedside nurse the patient's most recent platelet count. Confirmed with patient that they are not currently taking any anticoagulation, have any bleeding history or any family history of bleeding disorders. Patient expressed understanding and wished to proceed. All questions were answered. Sterile technique was used throughout the entire procedure. Please see nursing notes for vital signs. Test dose was given through epidural catheter and negative prior to continuing to dose epidural or start infusion. Warning signs of high block given to the patient including shortness of breath, tingling/numbness in hands, complete motor block, or any concerning symptoms with instructions to call for help. Patient was  given instructions on fall risk and not to get out of bed. All questions and concerns addressed with instructions to call with any issues or inadequate analgesia.      

## 2015-01-16 NOTE — Anesthesia Preprocedure Evaluation (Signed)
Anesthesia Evaluation  Patient identified by MRN, date of birth, ID band Patient awake    Reviewed: Allergy & Precautions, NPO status , Patient's Chart, lab work & pertinent test results  History of Anesthesia Complications Negative for: history of anesthetic complications  Airway Mallampati: II  TM Distance: >3 FB Neck ROM: Full    Dental no notable dental hx. (+) Dental Advisory Given   Pulmonary neg pulmonary ROS,    Pulmonary exam normal breath sounds clear to auscultation       Cardiovascular negative cardio ROS Normal cardiovascular exam Rhythm:Regular Rate:Normal     Neuro/Psych negative neurological ROS  negative psych ROS   GI/Hepatic negative GI ROS, Neg liver ROS,   Endo/Other  negative endocrine ROS  Renal/GU negative Renal ROS  negative genitourinary   Musculoskeletal negative musculoskeletal ROS (+)   Abdominal   Peds negative pediatric ROS (+)  Hematology negative hematology ROS (+)   Anesthesia Other Findings   Reproductive/Obstetrics (+) Pregnancy                             Anesthesia Physical Anesthesia Plan  ASA: II  Anesthesia Plan: Epidural   Post-op Pain Management:    Induction:   Airway Management Planned:   Additional Equipment:   Intra-op Plan:   Post-operative Plan:   Informed Consent: I have reviewed the patients History and Physical, chart, labs and discussed the procedure including the risks, benefits and alternatives for the proposed anesthesia with the patient or authorized representative who has indicated his/her understanding and acceptance.     Plan Discussed with: CRNA  Anesthesia Plan Comments:         Anesthesia Quick Evaluation

## 2015-01-16 NOTE — MAU Note (Signed)
Contractions since 0200. No leaking or bleeding.

## 2015-01-17 NOTE — Lactation Note (Signed)
This note was copied from the chart of Debra Bernard. Lactation Consultation Note  P2, Baby latched in cradle hold upon entering. Demonstrated to mother how to compress breast to keep baby active. Reviewed hand expression from other breast.  Mother pleased to view colostrum. Encouraged depth.  Discussed supply and demand. Mom encouraged to feed baby 8-12 times/24 hours and with feeding cues.  Mom made aware of O/P services, breastfeeding support groups, community resources, and our phone # for post-discharge questions.    Patient Name: Debra Bernard [id number] Date: 01/17/2015 Reason for consult: Initial assessment   Maternal Data Does the patient have breastfeeding experience prior to this delivery?: Yes  Feeding Feeding Type: Breast Fed Length of feed: 20 min  LATCH Score/Interventions Latch: Grasps breast easily, tongue down, lips flanged, rhythmical sucking.  Audible Swallowing: A few with stimulation Intervention(s): Skin to skin;Hand expression;Alternate breast massage  Type of Nipple: Everted at rest and after stimulation  Comfort (Breast/Nipple): Soft / non-tender     Hold (Positioning): Assistance needed to correctly position infant at breast and maintain latch.  LATCH Score: 8  Lactation Tools Discussed/Used     Consult Status Consult Status: Follow-up Date: 01/18/15 Follow-up type: In-patient    Vivianne Master [facility] 01/17/2015, 2:32 PM

## 2015-01-17 NOTE — Anesthesia Postprocedure Evaluation (Signed)
  Anesthesia Post-op Note  Patient: Debra Bernard  Procedure(s) Performed: * No procedures listed *  Patient Location: Mother/Baby  Anesthesia Type:Epidural  Level of Consciousness: awake, alert , oriented and patient cooperative  Airway and Oxygen Therapy: Patient Spontanous Breathing  Post-op Pain: none  Post-op Assessment: Post-op Vital signs reviewed, Patient's Cardiovascular Status Stable, Respiratory Function Stable, Patent Airway, No headache, No backache and Patient able to bend at knees              Post-op Vital Signs: Reviewed and stable  Last Vitals:  Filed Vitals:   01/16/15 2120  BP: 111/60  Pulse: 67  Temp: 37.1 C  Resp: 20    Complications: No apparent anesthesia complications

## 2015-01-17 NOTE — Progress Notes (Signed)
Post Partum Day 1 Subjective: no complaints, up ad lib, voiding and tolerating PO  Objective: Blood pressure 111/60, pulse 67, temperature 98.7 F (37.1 C), temperature source Oral, resp. rate 20, height 5\' 5"  (1.651 m), weight 81.194 kg (179 lb), last menstrual period 04/06/2014, SpO2 98 %, unknown if currently breastfeeding.  Physical Exam:  General: alert, cooperative and no distress Lochia: appropriate Uterine Fundus: firm Incision: healing well DVT Evaluation: No evidence of DVT seen on physical exam.   Recent Labs  01/16/15 0835  HGB 13.2  HCT 38.4    Assessment/Plan: Plan for discharge tomorrow and Breastfeeding   LOS: 1 day   [facility] 01/17/2015, 6:47 AM

## 2015-01-18 ENCOUNTER — Ambulatory Visit: Payer: Self-pay

## 2015-01-18 ENCOUNTER — Inpatient Hospital Stay (HOSPITAL_COMMUNITY): Admission: RE | Admit: 2015-01-18 | Payer: Self-pay | Source: Ambulatory Visit

## 2015-01-18 MED ORDER — IBUPROFEN 600 MG PO TABS
600.0000 mg | ORAL_TABLET | Freq: Four times a day (QID) | ORAL | Status: DC
Start: 2015-01-18 — End: 2015-03-16

## 2015-01-18 NOTE — Lactation Note (Signed)
This note was copied from the chart of Debra Marrah Vanevery. Lactation Consultation Note: Observed that infant is in cradle hold in mothers arm. Infant very sleepy with a poor latch. Advised mother in using pillow support and breast support. Assist mother with rousing infant with skin to skin. Infant had a large wet diaper. Assist with latch using cross cradle hold. Infant sustained latch for 15-20 mins. Observed audible swallows. Mother still concerned that she doesn't have enough milk. Reviewed hand expression and observed copious amts of milk. Mothers breast are filling. She was advised in breastfeeding infant 8-12 times daily and waking infant well for feedings. Mother was given a hand pump. She remembers using a  #27 or  #30 flange with last child. Mother advised to page for assistance with pump if needed. Mother advised to do good breast massage and ice breast to prevent engorgement. Mother is aware of available [place] services and community support.   Patient Name: Debra Bernard NWGNF'A Date: 01/18/2015     Maternal Data    Feeding    LATCH Score/Interventions                      Lactation Tools Discussed/Used     Consult Status      Jess Barters McCoy 01/18/2015, 4:04 PM

## 2015-01-18 NOTE — Progress Notes (Signed)
UR chart review completed.  

## 2015-01-18 NOTE — Discharge Summary (Signed)
OB Discharge Summary  Patient Name: Debra Bernard DOB: 02-11-1986 MRN: [id number]  Date of admission: 01/16/2015 Delivering MD: Yvonne Kendall A   Date of discharge: 01/18/2015  Admitting diagnosis: 51WKS CONTRACTIONS 5-10 MINS Intrauterine pregnancy: [redacted]w[redacted]d     Secondary diagnosis: None     Discharge diagnosis: Term Pregnancy Delivered                                                                                                Post partum procedures:none  Augmentation: Pitocin  Complications: None  Hospital course:  Onset of Labor With Vaginal Delivery     29 y.o. yo [id number] at 107w5d was admitted in Active Laboron 01/16/2015. Patient had an uncomplicated labor course as follows:  Membrane Rupture Time/Date: 11:52 AM ,01/16/2015   Intrapartum Procedures: Episiotomy: None [1]                                         Lacerations:  1st degree [2]  Patient had a delivery of a Viable infant. 01/16/2015  Information for the patient's newborn:  Wendell, Fiebig [[id number]]  Delivery Method: Vag-Spont    Pateint had an uncomplicated postpartum course.  She is ambulating, tolerating a regular diet, passing flatus, and urinating well. Patient is discharged home in stable condition on No discharge date for patient encounter.Marland Kitchen    Physical exam  Filed Vitals:   01/16/15 1715 01/16/15 2120 01/17/15 0725 01/17/15 1829  BP: 112/65 111/60 100/59 109/62  Pulse: 81 67 63 89  Temp: 98.7 F (37.1 C) 98.7 F (37.1 C) 98.6 F (37 C) 98.7 F (37.1 C)  TempSrc:  Oral  Oral  Resp: 18 20 18 20   Height:      Weight:      SpO2: 98%   100%   General: alert, cooperative and no distress Lochia: appropriate Uterine Fundus:firm Incision: N/A DVT Evaluation: No evidence of DVT seen on physical exam. Negative Homan's sign. No cords or calf tenderness. No significant calf/ankle edema. Labs: Lab Results  Component Value Date   WBC 9.6 01/16/2015   HGB 13.2 01/16/2015   HCT 38.4  01/16/2015   MCV 86.9 01/16/2015   PLT 195 01/16/2015   No flowsheet data found.  Discharge instruction: per After Visit Summary and "Baby and Me Booklet".  Medications:  Current facility-administered medications:  .  acetaminophen (TYLENOL) tablet 650 mg, 650 mg, Oral, Q4H PRN, Rogue Bussing, MD .  benzocaine-Menthol (DERMOPLAST) 20-0.5 % topical spray 1 application, 1 application, Topical, PRN, Rogue Bussing, MD .  witch hazel-glycerin (TUCKS) pad 1 application, 1 application, Topical, PRN **AND** dibucaine (NUPERCAINAL) 1 % rectal ointment 1 application, 1 application, Rectal, PRN, Rogue Bussing, MD .  diphenhydrAMINE (BENADRYL) capsule 25 mg, 25 mg, Oral, Q6H PRN, Rogue Bussing, MD .  ibuprofen (ADVIL,MOTRIN) tablet 600 mg, 600 mg, Oral, 4 times per day, Rogue Bussing, MD, 600 mg at 01/17/15 2331 .  lanolin ointment, , Topical, PRN, Hillary  Corinda Gubler, MD .  ondansetron [facility]) tablet 4 mg, 4 mg, Oral, Q4H PRN **OR** ondansetron (ZOFRAN) injection 4 mg, 4 mg, Intravenous, Q4H PRN, Rogue Bussing, MD .  oxyCODONE-acetaminophen (PERCOCET/ROXICET) 5-325 MG per tablet 1 tablet, 1 tablet, Oral, Q4H PRN, Rogue Bussing, MD .  oxyCODONE-acetaminophen (PERCOCET/ROXICET) 5-325 MG per tablet 2 tablet, 2 tablet, Oral, Q4H PRN, Rogue Bussing, MD .  prenatal multivitamin tablet 1 tablet, 1 tablet, Oral, Q1200, Rogue Bussing, MD, 1 tablet at 01/17/15 1308 .  senna-docusate (Senokot-S) tablet 2 tablet, 2 tablet, Oral, Q24H, Rogue Bussing, MD, 2 tablet at 01/17/15 2331 .  simethicone (MYLICON) chewable tablet 80 mg, 80 mg, Oral, PRN, Rogue Bussing, MD .  Tdap (BOOSTRIX) injection 0.5 mL, 0.5 mL, Intramuscular, Once, Rogue Bussing, MD .  zolpidem (AMBIEN) tablet 5 mg, 5 mg, Oral, QHS PRN, Rogue Bussing, MD  Facility-Administered Medications Ordered in Other Encounters:  .   lidocaine (PF) (XYLOCAINE) 1 % injection, , , Anesthesia Intra-op, Lauretta Grill, MD, 4 mL at 01/16/15 0942  Diet: routine diet  Activity: Advance as tolerated. Pelvic rest for 6 weeks.   Outpatient follow up:6 weeks  Postpartum contraception: IUD Mirena  Newborn Data: Live born female  Birth Weight: 7 lb 12.9 oz (3541 g) APGAR: 8, 9  Baby Feeding: Breast Disposition:home with mother   01/18/2015 Abbott Pao, CNM

## 2015-01-25 NOTE — H&P (Signed)
Chief Complaint: Labor Eval  HPI: Debra Bernard is a 29 y.o. G2P1001 at [redacted]w[redacted]d who presents to maternity admissions reporting painful/uncomfortable contractions which started this morning. Was noted to have significant cervical change w/ dilation to 4.5cm. Denies leakage of fluid or vaginal bleeding. Good fetal movement.   Pregnancy Course:  Clinic [facility] Prenatal Labs  Dating LMP Blood type: O/POS/-- (08/08 1457)   Genetic Screen Too late Antibody:NEG (08/08 1457)  Anatomic [place] female, normal. Fundal placenta Rubella: 1.10 (08/08 1457)  GTT Early: Third trimester: 99 RPR: NON REAC (08/08 1457)   Flu vaccine 12/15/14 HBsAg: NEGATIVE (08/08 1457)   TDaP vaccine 11/16/2014 Rhogam: NA HIV: NONREACTIVE (08/08 1457)   Baby Food Breast and bottle  GBS: (For PCN allergy, check sensitivities) Pos  Contraception IUD- Mirena Pap: March 2015 Negative  Circumcision desires, gave list of Government social research officer Health care for children   Support Person FOB nd Aunts     Past Medical History: Past Medical History  Diagnosis Date  . Medical history non-contributory     Past obstetric history: OB History  Gravida Para Term Preterm AB SAB TAB Ectopic Multiple Living  2 1 1  0 0 0 0 0 0 1    # Outcome Date GA Lbr Len/2nd Weight Sex Delivery Anes PTL Lv  2 Current           1 Term 09/17/13 [redacted]w[redacted]d 12:37 / 00:31 3.36 kg (7 lb 6.5 oz) M Vag-Spont EPI  Y      Past Surgical History: Past Surgical History  Procedure Laterality Date  . No past surgeries      Family History: Family History  Problem Relation Age of Onset  . Alcohol abuse Neg Hx   . Arthritis Neg Hx   . Asthma Neg Hx   . Birth defects Neg Hx   . Cancer Neg Hx   . COPD Neg Hx   . Depression Neg Hx   .  Diabetes Neg Hx   . Drug abuse Neg Hx   . Early death Neg Hx   . Hearing loss Neg Hx   . Hyperlipidemia Neg Hx   . Heart disease Neg Hx   . Hypertension Neg Hx   . Kidney disease Neg Hx   . Learning disabilities Neg Hx   . Mental illness Neg Hx   . Mental retardation Neg Hx   . Miscarriages / Stillbirths Neg Hx   . Stroke Neg Hx   . Vision loss Neg Hx     Social History: Social History  Substance Use Topics  . Smoking status: Never Smoker   . Smokeless tobacco: Never Used  . Alcohol Use: No    Allergies: No Known Allergies  Meds:  Prescriptions prior to admission  Medication Sig Dispense Refill Last Dose  . terconazole (TERAZOL 7) 0.4 % vaginal cream Place 1 applicator vaginally at bedtime. x 7 nights (Patient not taking: Reported on 01/16/2015) 45 g 0 Taking    ROS: Pertinent findings in history of present illness.  Physical Exam  Blood pressure 111/66, pulse 91, temperature 98.7 F (37.1 C), temperature source Oral, resp. rate 17, height 5\' 5"  (1.651 m), weight 81.194 kg (179 lb), last menstrual period 04/06/2014, currently breastfeeding. GENERAL: Well-developed, well-nourished female in no acute distress.  HEENT: normocephalic HEART: normal rate RESP: normal effort ABDOMEN: Soft, non-tender, gravid appropriate for gestational age EXTREMITIES: Nontender, no edema NEURO: alert and oriented  Dilation: 4.5 Effacement (%): 80 Cervical Position: Anterior Station: -2  Presentation: Vertex Exam by:: Lavonna Rua, RNC  FHT: Baseline 135, moderate variability, accelerations present, no decelerations Contractions: q 3-6 mins  Labs:  Lab Results Last 24 Hours    No results found for this or any previous visit (from the past 24 hour(s)).    Imaging:   Imaging Results    [place] Mfm Ob Limited  01/13/2015 OBSTETRICAL ULTRASOUND: This exam was performed within a  Ultrasound  Department. The OB US report was generated in the AS system, and faxed to the ordering physician. This report is available in the [employer]. See the AS Obstetric US report via the Image Link.   Assessment: 1. Labor: active 2. Fetal Wellbeing: Category I  3. Pain Control: Epidural, fentanyl x 1 4. GBS: positive 5. 40.5 week IUP  Plan:  1. Admit to BS per consult with MD 2. Routine L&D orders 3. Analgesia/anesthesia PRN     Medication List    ASK your doctor about these medications       terconazole 0.4 % vaginal cream  Commonly known as: TERAZOL 7  Place 1 applicator vaginally at bedtime. x 7 nights        Elberta Leatherwood, MD 01/16/2015 8:22 AM  Yvonne Kendall CNM       Cosigned by: Larey Days, CNM at 01/18/2015 6:36 PM  Revision History     Date/Time User Provider Type Action   01/18/2015 6:36 PM Larey Days, CNM Certified Nurse Midwife Cosign   01/16/2015 10:50 AM Ramiro Harvest Corinda Gubler, MD Resident Sign   01/16/2015 10:43 AM Ramiro Harvest Corinda Gubler, MD Resident Share   01/16/2015 8:28 AM Elberta Leatherwood, MD Resident Share   View Details Report       Routing History     Date/Time From To Method   01/18/2015 6:36 PM Larey Days, CNM Rogue Bussing, MD In [facility]   01/18/2015 6:36 PM Larey Days, CNM Mora Bellman, MD In Basket

## 2015-02-24 ENCOUNTER — Ambulatory Visit: Payer: Self-pay | Admitting: Obstetrics & Gynecology

## 2015-03-16 ENCOUNTER — Encounter: Payer: Self-pay | Admitting: Advanced Practice Midwife

## 2015-03-16 ENCOUNTER — Ambulatory Visit (INDEPENDENT_AMBULATORY_CARE_PROVIDER_SITE_OTHER): Payer: Self-pay | Admitting: Advanced Practice Midwife

## 2015-03-16 VITALS — BP 113/74 | HR 102 | Temp 98.4°F | Ht 64.0 in | Wt 175.0 lb

## 2015-03-16 DIAGNOSIS — M545 Low back pain, unspecified: Secondary | ICD-10-CM

## 2015-03-16 DIAGNOSIS — IMO0001 Reserved for inherently not codable concepts without codable children: Secondary | ICD-10-CM

## 2015-03-16 MED ORDER — NORETHINDRONE 0.35 MG PO TABS: 1.0000 | ORAL_TABLET | Freq: Every day | ORAL | Status: DC

## 2015-03-16 MED ORDER — IBUPROFEN 400 MG PO TABS: 400.0000 mg | ORAL_TABLET | Freq: Four times a day (QID) | ORAL | Status: DC | PRN

## 2015-03-16 NOTE — Progress Notes (Signed)
Patient ID: Debra Bernard, female   DOB: Jan 12, 1986, 29 y.o.   MRN: [id number] Subjective:     Debra Bernard is a 29 y.o. female who presents for a postpartum visit. She is 8 weeks postpartum following a spontaneous vaginal delivery. I have fully reviewed the prenatal and intrapartum course. The delivery was at 40.5 gestational weeks. Outcome: spontaneous vaginal delivery. Anesthesia: regional and epidural. Postpartum course has been unremarkable. Baby's course has been unremarkable. Baby is feeding by breast. Bleeding no bleeding. Bowel function is normal. Bladder function is normal. Patient is not sexually active. Contraception method is none. Postpartum depression screening: negative.  The following portions of the patient's history were reviewed and updated as appropriate: allergies, current medications, past family history, past medical history, past social history, past surgical history and problem list.  Review of Systems Musculoskeletal:positive for back pain  Other ROS is negative Some spotting at times Breastfeeding well  Objective:    There were no vitals taken for this visit.  General:  alert, cooperative and no distress   Breasts:  inspection negative, no nipple discharge or bleeding, no masses or nodularity palpable  Lungs: clear to auscultation bilaterally  Heart:  regular rate and rhythm, S1, S2 normal, no murmur, click, rub or gallop  Abdomen: soft, non-tender; bowel sounds normal; no masses,  no organomegaly   Vulva:  not evaluated  Vagina: not evaluated  Cervix:  n/a  Corpus: not examined  Adnexa:  not evaluated  Rectal Exam: Not performed.        Assessment:     Normal postpartum exam. Pap smear not done at today's visit.   Plan:    1. Contraception: IUD 2. Will Rx Micronor until she can go to HD for the IUD 3. Follow up in: 1 year or as needed.

## 2015-03-16 NOTE — Patient Instructions (Signed)

## 2020-12-08 ENCOUNTER — Ambulatory Visit: Payer: Self-pay

## 2020-12-08 ENCOUNTER — Ambulatory Visit (INDEPENDENT_AMBULATORY_CARE_PROVIDER_SITE_OTHER): Payer: Self-pay

## 2020-12-08 DIAGNOSIS — Z3200 Encounter for pregnancy test, result unknown: Secondary | ICD-10-CM

## 2020-12-08 NOTE — Progress Notes (Signed)
Agree with A & P. 

## 2020-12-08 NOTE — Progress Notes (Signed)
Possible Pregnancy  Here today for pregnancy confirmation. UPT in office today is positive. Called pt with results. Pt reports first positive home UPT last week. Reviewed dating with patient:   LMP: 10/25/20 EDD: 08/01/21 6w 2d today  OB history reviewed. Reviewed medications and allergies with patient. Recommended pt begin prenatal vitamin and schedule prenatal care. Pt requests pregnancy confirmation letter for insurance. Explained this will be at front desk for patient to pick up. Pt states she will schedule new OB appts when she comes to pick up letter.   Entire telephone encounter completed with [facility] interpreter ID [phone].  Annabell Howells, RN 12/08/2020  3:43 PM

## 2020-12-09 LAB — POCT PREGNANCY, URINE: Preg Test, Ur: POSITIVE — AB

## 2020-12-30 ENCOUNTER — Ambulatory Visit: Payer: Self-pay

## 2020-12-30 ENCOUNTER — Other Ambulatory Visit: Payer: Self-pay

## 2020-12-30 VITALS — Wt 183.4 lb

## 2020-12-30 DIAGNOSIS — O09523 Supervision of elderly multigravida, third trimester: Secondary | ICD-10-CM | POA: Insufficient documentation

## 2020-12-30 DIAGNOSIS — O0990 Supervision of high risk pregnancy, unspecified, unspecified trimester: Secondary | ICD-10-CM

## 2020-12-30 DIAGNOSIS — Z3491 Encounter for supervision of normal pregnancy, unspecified, first trimester: Secondary | ICD-10-CM

## 2020-12-30 DIAGNOSIS — O09519 Supervision of elderly primigravida, unspecified trimester: Secondary | ICD-10-CM

## 2020-12-30 NOTE — Progress Notes (Signed)
New OB Intake  I connected with  [name] on 12/30/20 at  2:15 PM EDT by MyChart Video Visit and verified that I am speaking with the correct person using two identifiers. Nurse is located at [facility] and pt is located at home.  I discussed the limitations, risks, security and privacy concerns of performing an evaluation and management service by telephone and the availability of in person appointments. I also discussed with the patient that there may be a patient responsible charge related to this service. The patient expressed understanding and agreed to proceed.  I explained I am completing New OB Intake today. We discussed her EDD of 08/01/21 that is based on LMP of 10/25/20. Pt is G4/P2. I reviewed her allergies, medications, Medical/Surgical/OB history, and appropriate screenings. I informed her of [facility] services. Based on history, this is a/an  pregnancy uncomplicated .   Patient Active Problem List   Diagnosis Date Noted   Active labor 01/16/2015   Group B streptococcal infection during pregnancy 12/22/2014   Supervision of normal intrauterine pregnancy in multigravida 12/01/2014   Short interval between pregnancies affecting pregnancy, antepartum 12/01/2014   Limited prenatal care in third trimester 11/16/2014    Concerns addressed today  Delivery Plans:  Plans to deliver at [facility].   MyChart/Babyscripts MyChart access verified. I explained pt will have some visits in office and some virtually. Babyscripts instructions given and order placed. Patient verifies receipt of registration text/e-mail. Account successfully created and app downloaded.  Blood Pressure Cuff  Patient is self-pay; explained patient will be given BP cuff at first prenatal appt. Explained after first prenatal appt pt will check weekly and document in 103.  Weight Scale Patient does not have weight scale at home.   Anatomy US Explained first scheduled Korea will be around 19 weeks. Anatomy US  scheduled for 03/07/21 at [place].   Labs Discussed Johnsie Cancel genetic screening with patient. Would like both Panorama and Horizon drawn at new OB visit. Routine prenatal labs needed.  Covid Vaccine Patient has not covid vaccine.   Mother/ Baby Dyad Candidate?   Not a candidate If yes, offer as possibility    Social Determinants of Health Food Insecurity: Patient denies food insecurity. WIC Referral: Patient is interested in referral to [facility].  Transportation: Patient denies transportation needs. Childcare: Discussed no children allowed at ultrasound appointments. Offered childcare services; patient declines childcare services at this time.     Placed OB Box on problem list and updated  First visit review I reviewed new OB appt with pt. I explained she will have a pelvic exam, ob bloodwork with genetic screening, and PAP smear. Explained pt will be seen by Maye Hides, CNM  at first visit; encounter routed to appropriate provider. Explained that patient will be seen by pregnancy navigator following visit with provider. [facility] information placed in AVS.   Verdell Carmine, RN 12/30/2020  2:52 PM

## 2020-12-30 NOTE — Progress Notes (Signed)
FHR obtained via informal bedside ultrasound, FHR 173bpm

## 2021-01-13 ENCOUNTER — Other Ambulatory Visit: Payer: Self-pay

## 2021-01-13 ENCOUNTER — Ambulatory Visit (INDEPENDENT_AMBULATORY_CARE_PROVIDER_SITE_OTHER): Payer: Medicaid Other | Admitting: Student

## 2021-01-13 ENCOUNTER — Other Ambulatory Visit (HOSPITAL_COMMUNITY)
Admission: RE | Admit: 2021-01-13 | Discharge: 2021-01-13 | Disposition: A | Discharge status: Home / Self Care | Payer: Medicaid Other | Source: Ambulatory Visit | Attending: Student | Admitting: Student

## 2021-01-13 VITALS — BP 122/87 | HR 87

## 2021-01-13 DIAGNOSIS — Z3A11 11 weeks gestation of pregnancy: Secondary | ICD-10-CM

## 2021-01-13 DIAGNOSIS — Z3481 Encounter for supervision of other normal pregnancy, first trimester: Secondary | ICD-10-CM | POA: Diagnosis not present

## 2021-01-13 DIAGNOSIS — O0990 Supervision of high risk pregnancy, unspecified, unspecified trimester: Secondary | ICD-10-CM

## 2021-01-13 MED ORDER — HYDROCORT-PRAMOXINE (PERIANAL) 1-1 % EX FOAM: 1.0000 | Freq: Two times a day (BID) | CUTANEOUS | 1 refills | Status: DC

## 2021-01-13 MED ORDER — DOXYLAMINE-PYRIDOXINE 10-10 MG PO TBEC: 1.0000 | DELAYED_RELEASE_TABLET | Freq: Three times a day (TID) | ORAL | 2 refills | Status: DC

## 2021-01-13 NOTE — Progress Notes (Signed)
  Subjective:    Debra Bernard is being seen today for her first obstetrical visit.  This is a planned pregnancy. She is at [redacted]w[redacted]d gestation. Her obstetrical history is significant for  nothing. She denies any c/sections, history of blood pressure issues or blood sugar issues . Relationship with FOB:  Her husband is in [place] . Patient does intend to breast feed. Pregnancy history fully reviewed. She reports nausea and complaints of hemorroids.   Patient reports nausea but no vomiting.   Review of Systems:   Review of Systems  Constitutional: Negative.   HENT: Negative.    Respiratory: Negative.    Cardiovascular: Negative.   Gastrointestinal: Negative.   Genitourinary: Negative.   Musculoskeletal: Negative.   Neurological: Negative.   Hematological: Negative.   Psychiatric/Behavioral: Negative.     Objective:     BP 122/87   Pulse 87   LMP 10/25/2020  Physical Exam Constitutional:      Appearance: Normal appearance.  Abdominal:     General: Abdomen is flat.  Genitourinary:    General: Normal vulva.     Rectum: Normal.     Comments: External hemorroids noted  Musculoskeletal:        General: Normal range of motion.  Skin:    General: Skin is warm and dry.     Capillary Refill: Capillary refill takes less than 2 seconds.  Neurological:     General: No focal deficit present.     Mental Status: She is alert.    Maternal Exam:  Introitus: Normal vulva. ; normal discharge, spotting noted after pap smear. Multiple external hemorroids noted; non appear strangulated    Assessment:    Pregnancy: [id number] Patient Active Problem List   Diagnosis Date Noted   AMA (advanced maternal age) primigravida 35+ 12/30/2020   Supervision of high risk pregnancy, antepartum 12/30/2020       Plan:     Initial labs drawn. Prenatal vitamins. Problem list reviewed and updated. AFP3 discussed:  too early . Role of ultrasound in pregnancy discussed; fetal survey:  ordered. Amniocentesis discussed: not indicated. Follow up in 4 weeks for LROB with KK  75% of 30 min visit spent on counseling and coordination of care.  -patient given instructions on hemorroid management, plus proctofoam -pap done today -RX for diclegis given  Mervyn Skeeters [facility] 01/13/2021

## 2021-01-13 NOTE — Progress Notes (Signed)
Chart reviewed for nurse visit. Agree with plan of care.   Starr Lake, CNM 01/13/2021 5:22 PM

## 2021-01-13 NOTE — Progress Notes (Signed)
Patient reports occasional nausea

## 2021-01-14 LAB — CBC/D/PLT+RPR+RH+ABO+RUBIGG...
Antibody Screen: NEGATIVE
Basophils Absolute: 0.1 10*3/uL (ref 0.0–0.2)
Basos: 1 %
EOS (ABSOLUTE): 0.1 10*3/uL (ref 0.0–0.4)
Eos: 2 %
HCV Ab: <0.1 s/co ratio (ref 0.0–0.9)
HIV Screen 4th Generation wRfx: NONREACTIVE
Hematocrit: 36.4 % (ref 34.0–46.6)
Hemoglobin: 12.4 g/dL (ref 11.1–15.9)
Hepatitis B Surface Ag: NEGATIVE
Immature Grans (Abs): 0.1 10*3/uL (ref 0.0–0.1)
Immature Granulocytes: 1 %
Lymphocytes Absolute: 1.8 10*3/uL (ref 0.7–3.1)
Lymphs: 23 %
MCH: 28.8 pg (ref 26.6–33.0)
MCHC: 34.1 g/dL (ref 31.5–35.7)
MCV: 85 fL (ref 79–97)
Monocytes Absolute: 0.5 10*3/uL (ref 0.1–0.9)
Monocytes: 7 %
Neutrophils Absolute: 5.1 10*3/uL (ref 1.4–7.0)
Neutrophils: 66 %
Platelets: 228 10*3/uL (ref 150–450)
RBC: 4.3 x10E6/uL (ref 3.77–5.28)
RDW: 13.1 % (ref 11.7–15.4)
RPR Ser Ql: NONREACTIVE
Rh Factor: POSITIVE
Rubella Antibodies, IGG: 1.38 index (ref >0.99)
WBC: 7.5 10*3/uL (ref 3.4–10.8)

## 2021-01-14 LAB — HEMOGLOBIN A1C
Est. average glucose Bld gHb Est-mCnc: 117 mg/dL
Hgb A1c MFr Bld: 5.7 % — ABNORMAL HIGH (ref 4.8–5.6)

## 2021-01-14 LAB — HCV INTERPRETATION

## 2021-01-15 LAB — URINE CULTURE, OB REFLEX: Organism ID, Bacteria: NO GROWTH

## 2021-01-15 LAB — CULTURE, OB URINE

## 2021-01-19 LAB — CYTOLOGY - PAP
Chlamydia: NEGATIVE
Comment: NEGATIVE
Comment: NEGATIVE
Comment: NORMAL
Diagnosis: NEGATIVE
High risk HPV: NEGATIVE
Neisseria Gonorrhea: NEGATIVE

## 2021-02-10 ENCOUNTER — Other Ambulatory Visit: Payer: Self-pay

## 2021-02-10 ENCOUNTER — Ambulatory Visit (INDEPENDENT_AMBULATORY_CARE_PROVIDER_SITE_OTHER): Payer: Medicaid Other

## 2021-02-10 VITALS — BP 137/77 | HR 105 | Wt 191.6 lb

## 2021-02-10 DIAGNOSIS — O09522 Supervision of elderly multigravida, second trimester: Secondary | ICD-10-CM

## 2021-02-10 DIAGNOSIS — Z3A15 15 weeks gestation of pregnancy: Secondary | ICD-10-CM

## 2021-02-10 DIAGNOSIS — O0990 Supervision of high risk pregnancy, unspecified, unspecified trimester: Secondary | ICD-10-CM

## 2021-02-10 DIAGNOSIS — O2242 Hemorrhoids in pregnancy, second trimester: Secondary | ICD-10-CM

## 2021-02-10 MED ORDER — HYDROCORT-PRAMOXINE (PERIANAL) 1-1 % EX FOAM: 1.0000 | Freq: Two times a day (BID) | CUTANEOUS | Status: DC

## 2021-02-10 MED ORDER — BLOOD PRESSURE KIT DEVI
1.0000 | 0 refills | Status: DC | PRN
Start: 2021-02-10 — End: 2021-08-10

## 2021-02-10 NOTE — Progress Notes (Signed)
   PRENATAL VISIT NOTE  Subjective:  Debra Bernard is a 35 y.o. [id number] at 75w3dbeing seen today for ongoing prenatal care.  She is currently monitored for the following issues for this high-risk pregnancy and has AMA (advanced maternal age) primigravida 332+and Supervision of high risk pregnancy, antepartum on their problem list.  Patient reports no complaints.  Contractions: Not present. Vag. Bleeding: None.  Movement: Present. Denies leaking of fluid.   The following portions of the patient's history were reviewed and updated as appropriate: allergies, current medications, past family history, past medical history, past social history, past surgical history and problem list.   Objective:   Vitals:   02/10/21 1418  BP: 137/77  Pulse: (!) 105  Weight: 191 lb 9.6 oz (86.9 kg)    Fetal Status: Fetal Heart Rate (bpm): 161   Movement: Present     General:  Alert, oriented and cooperative. Patient is in no acute distress.  Skin: Skin is warm and dry. No rash noted.   Cardiovascular: Normal heart rate noted  Respiratory: Normal respiratory effort, no problems with respiration noted  Abdomen: Soft, gravid, appropriate for gestational age.  Pain/Pressure: Absent     Pelvic: Cervical exam deferred        Extremities: Normal range of motion.  Edema: None  Mental Status: Normal mood and affect. Normal behavior. Normal judgment and thought content.   Assessment and Plan:  Pregnancy: [id number] 180w3d. Supervision of high risk pregnancy, antepartum - Routine OB care - No concerns - Discussed location of WCC and MAU   - Blood Pressure Monitoring (BLOOD PRESSURE KIT) DEVI; 1 Device by Does not apply route as needed.  Dispense: 1 each; Refill: 0  2. Hemorrhoids during pregnancy in second trimester - Stable. Requesting refill for proctofoam. Rx sent  3. AMA (advanced maternal age) multigravida 3576+second trimester - LR NIPS   Preterm labor symptoms and general obstetric precautions  including but not limited to vaginal bleeding, contractions, leaking of fluid and fetal movement were reviewed in detail with the patient. Please refer to After Visit Summary for other counseling recommendations.   Return in about 4 weeks (around 03/10/2021).  Future Appointments  Date Time Provider [place]11/28/2022  8:45 AM [facility] NURSE [facility]11/28/2022  9:00 AM [facility] US1 [facility]12/04/2020  2:15 PM ErChancy MilroyMD [facility] 02/10/21 3:04 PM

## 2021-03-07 ENCOUNTER — Other Ambulatory Visit: Payer: Self-pay

## 2021-03-07 ENCOUNTER — Ambulatory Visit: Payer: Medicaid Other | Admitting: *Deleted

## 2021-03-07 ENCOUNTER — Ambulatory Visit: Payer: Medicaid Other | Attending: Student

## 2021-03-07 ENCOUNTER — Other Ambulatory Visit: Payer: Self-pay | Admitting: *Deleted

## 2021-03-07 VITALS — BP 113/69 | HR 92

## 2021-03-07 DIAGNOSIS — O09522 Supervision of elderly multigravida, second trimester: Secondary | ICD-10-CM | POA: Diagnosis not present

## 2021-03-07 DIAGNOSIS — O09513 Supervision of elderly primigravida, third trimester: Secondary | ICD-10-CM | POA: Diagnosis present

## 2021-03-07 DIAGNOSIS — Z363 Encounter for antenatal screening for malformations: Secondary | ICD-10-CM | POA: Diagnosis not present

## 2021-03-07 DIAGNOSIS — O0990 Supervision of high risk pregnancy, unspecified, unspecified trimester: Secondary | ICD-10-CM | POA: Insufficient documentation

## 2021-03-07 DIAGNOSIS — O99212 Obesity complicating pregnancy, second trimester: Secondary | ICD-10-CM | POA: Insufficient documentation

## 2021-03-07 DIAGNOSIS — Z3491 Encounter for supervision of normal pregnancy, unspecified, first trimester: Secondary | ICD-10-CM | POA: Diagnosis not present

## 2021-03-07 DIAGNOSIS — Z3A19 19 weeks gestation of pregnancy: Secondary | ICD-10-CM | POA: Diagnosis not present

## 2021-03-07 DIAGNOSIS — Z6831 Body mass index (BMI) 31.0-31.9, adult: Secondary | ICD-10-CM

## 2021-03-10 ENCOUNTER — Ambulatory Visit (INDEPENDENT_AMBULATORY_CARE_PROVIDER_SITE_OTHER): Payer: Medicaid Other | Admitting: Obstetrics and Gynecology

## 2021-03-10 ENCOUNTER — Encounter: Payer: Self-pay | Admitting: Obstetrics and Gynecology

## 2021-03-10 ENCOUNTER — Other Ambulatory Visit: Payer: Self-pay

## 2021-03-10 VITALS — BP 127/89 | HR 98 | Wt 193.4 lb

## 2021-03-10 DIAGNOSIS — O09512 Supervision of elderly primigravida, second trimester: Secondary | ICD-10-CM

## 2021-03-10 DIAGNOSIS — O0990 Supervision of high risk pregnancy, unspecified, unspecified trimester: Secondary | ICD-10-CM

## 2021-03-10 MED ORDER — GOJJI WEIGHT SCALE MISC
1.0000 | Freq: Every day | Status: DC
Start: 2021-03-10 — End: 2021-04-07

## 2021-03-10 MED ORDER — GOJJI WEIGHT SCALE MISC
1.0000 | Freq: Every day | Status: DC
Start: 2021-03-10 — End: 2021-03-10

## 2021-03-10 MED ORDER — PREPLUS 27-1 MG PO TABS: 1.0000 | ORAL_TABLET | Freq: Every day | ORAL | 13 refills | Status: AC

## 2021-03-10 NOTE — Progress Notes (Signed)
Subjective:  Debra Bernard is a 35 y.o. [id number] at [redacted]w[redacted]d being seen today for ongoing prenatal care.  She is currently monitored for the following issues for this high-risk pregnancy and has AMA (advanced maternal age) primigravida 74+ and Supervision of high risk pregnancy, antepartum on their problem list.  Patient reports no complaints.  Contractions: Not present. Vag. Bleeding: None.  Movement: Present. Denies leaking of fluid.   The following portions of the patient's history were reviewed and updated as appropriate: allergies, current medications, past family history, past medical history, past social history, past surgical history and problem list. Problem list updated.  Objective:   Vitals:   03/10/21 1437  BP: 127/89  Pulse: 98  Weight: 193 lb 6.4 oz (87.7 kg)    Fetal Status: Fetal Heart Rate (bpm): 164   Movement: Present     General:  Alert, oriented and cooperative. Patient is in no acute distress.  Skin: Skin is warm and dry. No rash noted.   Cardiovascular: Normal heart rate noted  Respiratory: Normal respiratory effort, no problems with respiration noted  Abdomen: Soft, gravid, appropriate for gestational age. Pain/Pressure: Present     Pelvic:  Cervical exam deferred        Extremities: Normal range of motion.  Edema: None  Mental Status: Normal mood and affect. Normal behavior. Normal judgment and thought content.   Urinalysis:      Assessment and Plan:  Pregnancy: [id number] at [redacted]w[redacted]d  1. Supervision of high risk pregnancy, antepartum Stable - Misc. Devices (GOJJI WEIGHT SCALE) MISC; 1 each by Does not apply route daily.  Dispense: 1 each; Refill: O - Prenatal Vit-Fe Fumarate-FA (PREPLUS) 27-1 MG TABS; Take 1 tablet by mouth daily.  Dispense: 30 tablet; Refill: 13  2. Primigravida of advanced maternal age in second trimester Normal anatomy scan last month F/U growth scan ordered Negative genetic testing  Preterm labor symptoms and general obstetric precautions  including but not limited to vaginal bleeding, contractions, leaking of fluid and fetal movement were reviewed in detail with the patient. Please refer to After Visit Summary for other counseling recommendations.  Return in about 4 weeks (around 04/07/2021) for OB visit, face to face, any provider.   Chancy Milroy, MD

## 2021-03-10 NOTE — Patient Instructions (Signed)

## 2021-03-10 NOTE — Addendum Note (Signed)
Addended by: Bethanne Ginger on: 03/10/2021 05:31 PM   Modules accepted: Orders

## 2021-03-28 ENCOUNTER — Telehealth: Payer: Self-pay | Admitting: *Deleted

## 2021-03-28 NOTE — Telephone Encounter (Signed)
Received a telephone call from [facility] patient had BP of 122/90 with no symptoms. Per chart review last bp at 03/10/21 visit. I called her and she confirms no headache, no edema, no visual disturbances. I asked her to recheck her bp. She reports BP 124/74. I reviewed bp guidelines and pre-eclampsia symptoms. She voices understanding. Will route to ob provider.  Shilpa Bushee,RN

## 2021-04-07 ENCOUNTER — Ambulatory Visit (INDEPENDENT_AMBULATORY_CARE_PROVIDER_SITE_OTHER): Payer: Medicaid Other | Admitting: Family Medicine

## 2021-04-07 ENCOUNTER — Other Ambulatory Visit: Payer: Self-pay

## 2021-04-07 VITALS — BP 120/82 | HR 94 | Wt 195.0 lb

## 2021-04-07 DIAGNOSIS — N898 Other specified noninflammatory disorders of vagina: Secondary | ICD-10-CM

## 2021-04-07 DIAGNOSIS — O0990 Supervision of high risk pregnancy, unspecified, unspecified trimester: Secondary | ICD-10-CM

## 2021-04-07 DIAGNOSIS — O09512 Supervision of elderly primigravida, second trimester: Secondary | ICD-10-CM

## 2021-04-07 DIAGNOSIS — O2242 Hemorrhoids in pregnancy, second trimester: Secondary | ICD-10-CM

## 2021-04-07 MED ORDER — TERCONAZOLE 0.8 % VA CREA: 1.0000 | TOPICAL_CREAM | Freq: Every day | VAGINAL | 0 refills | Status: DC

## 2021-04-07 MED ORDER — GOJJI WEIGHT SCALE MISC
1.0000 | Freq: Every day | 0 refills | Status: DC
Start: 2021-04-07 — End: 2021-05-20

## 2021-04-07 MED ORDER — HYDROCORT-PRAMOXINE (PERIANAL) 1-1 % EX FOAM: 1.0000 | Freq: Two times a day (BID) | CUTANEOUS | 1 refills | Status: DC

## 2021-04-07 MED ORDER — GOJJI WEIGHT SCALE MISC
1.0000 | Freq: Every day | Status: DC
Start: 2021-04-07 — End: 2021-04-07

## 2021-04-07 NOTE — Progress Notes (Signed)
° °  PRENATAL VISIT NOTE  Subjective:  Debra Bernard is a 35 y.o. [id number] at [redacted]w[redacted]d being seen today for ongoing prenatal care.  She is currently monitored for the following issues for this low-risk pregnancy and has AMA (advanced maternal age) primigravida 24+ and Supervision of high risk pregnancy, antepartum on their problem list.  Patient reports no complaints.  Contractions: Not present. Vag. Bleeding: None.  Movement: Present. Denies leaking of fluid.   The following portions of the patient's history were reviewed and updated as appropriate: allergies, current medications, past family history, past medical history, past social history, past surgical history and problem list.   Objective:   Vitals:   04/07/21 1504  BP: 120/82  Pulse: 94  Weight: 195 lb (88.5 kg)    Fetal Status: Fetal Heart Rate (bpm): 167 Fundal Height: 25 cm Movement: Present     General:  Alert, oriented and cooperative. Patient is in no acute distress.  Skin: Skin is warm and dry. No rash noted.   Cardiovascular: Normal heart rate noted  Respiratory: Normal respiratory effort, no problems with respiration noted  Abdomen: Soft, gravid, appropriate for gestational age.  Pain/Pressure: Present     Pelvic: Cervical exam deferred        Extremities: Normal range of motion.  Edema: None  Mental Status: Normal mood and affect. Normal behavior. Normal judgment and thought content.   Assessment and Plan:  Pregnancy: [id number] at [redacted]w[redacted]d 1. Supervision of high risk pregnancy, antepartum Continue routine prenatal care.  - Misc. Devices (GOJJI WEIGHT SCALE) MISC; 1 each by Does not apply route daily. To monitor weight at home weekly icd 10 dx: o09.90 (Patient not taking: Reported on 04/07/2021)  Dispense: 1 each; Refill: 0 - AMBULATORY REFERRAL TO [facility]  2. Primigravida of advanced maternal age in second trimester Low risk NIPT  3. Hemorrhoids during pregnancy in second trimester Refill Proctofoam -  hydrocortisone-pramoxine (PROCTOFOAM-HC) rectal foam; Place 1 applicator rectally 2 (two) times daily.  Dispense: 10 g; Refill: 1  4. Vagina itching Trial of yeast treatment. If no improvement, check wet prep - terconazole (TERAZOL 3) 0.8 % vaginal cream; Place 1 applicator vaginally at bedtime.  Dispense: 20 g; Refill: 0  Preterm labor symptoms and general obstetric precautions including but not limited to vaginal bleeding, contractions, leaking of fluid and fetal movement were reviewed in detail with the patient. Please refer to After Visit Summary for other counseling recommendations.   Return in 4 weeks (on 05/05/2021) for 28 wk labs, [facility].  Future Appointments  Date Time Provider [place]  05/05/2021  8:20 AM [facility] LAB [facility]  05/05/2021  8:35 AM [place], CNM [facility]  05/30/2021  8:30 AM [facility] NURSE [facility]  05/30/2021  8:45 AM [facility] US4 [facility]    Donnamae Jude, MD

## 2021-04-07 NOTE — Patient Instructions (Signed)

## 2021-04-07 NOTE — Progress Notes (Signed)
Patient reports vaginal itching for the past 3 weeks to one month

## 2021-04-10 NOTE — L&D Delivery Note (Addendum)
Debra Bernard is a 36 y.o. female [phone] with IUP at 19w0dadmitted for term IOL with unstable lie.  She progressed with cytotec, foley bulb, pitocin and AROM augmentation to complete. ? ?CNM called to bedside due to prolonged deceleration. Cervix found to be 10/100/+1 station. Dr. WSi Raiderpresent at assessment. Began pushing with minimal maternal effort. Given fetal bradycardia and minimal movement with pushing, MD proceeded with vacuum delivery. Delivery call made. Infant delivered with normal heart rate but no tone or respiratory effort. Cord clamped and cut by CNM and infant taken to waiting NICU team ? ?Delivery Note ?At 8:31 AM a viable female was delivered via Vaginal, Spontaneous (Presentation: Right Occiput Posterior).  APGAR: 2, 6; weight 7 lb 12 oz (3515 g).   ?Placenta status: Spontaneous, Intact.  Cord: 3 vessels with the following complications: None.  Cord pH: collected ? ?Anesthesia: Epidural ?Episiotomy: None ?Lacerations: 2nd degree ?Suture Repair: 3.0 vicryl ?Est. Blood Loss (mL): 1300 ? ?After delivery of infant, infant and maternal temperature felt elevated at the touch. Maternal temperature taken and found to be 101 s/p tylenol approximately 30 minutes before. 2g Ancef ordered ? ?Brisk bright red bleeding after delivery of placenta. Pitocin bolus already running. IM methergine given with minimal effect. TXA given. Fundus remained firm but brisk bleeding noted. Sweep of uterus revealed no clots or placental fragments. Dr. WSi Raidernotified of bleeding and intent to place JADA. Uterine sweep performed again and JADA placed without difficulty and connected to suction. Rectal cytotec placed. With initiation of JADA, 203mof blood immediately filled canister and brisk bright red bleeding noted around JADA device. Dr. WoSi Raideralled to bedside for further assessment.  ? ?Dr. WoSi Raideremoved JADA and further inspection of vagina for lacerations performed. Concern for possible cervical laceration and Dr. PiIlda Bassetcalled to bedside. Given blood loss at this time, CODE hemorrhage called by RN.   ? ?Dr. PiIlda Bassetnspected cervix and found no concerning lacerations. CNM replaced JADA and connected to suction. Brisk bright red bleeding persisted and second dose of TXA given at 15 minutes past first dose. Second IV line initiated and DIC labs obtained.  ? ?Despite all interventions, patient continued to have brisk bright red bleeding and became symptomatic and hypotensive. Decision was made to proceed to OR for further management of bleeding.  ? ?Patient taken to OR with Dr. PiIlda Bernard bedside.  ? ?CaWende MottNM ?08/01/2021, 9:54 AM ? ? ? ?

## 2021-05-02 ENCOUNTER — Other Ambulatory Visit: Payer: Self-pay

## 2021-05-02 DIAGNOSIS — O0990 Supervision of high risk pregnancy, unspecified, unspecified trimester: Secondary | ICD-10-CM

## 2021-05-05 ENCOUNTER — Ambulatory Visit (INDEPENDENT_AMBULATORY_CARE_PROVIDER_SITE_OTHER): Payer: Medicaid Other | Admitting: Student

## 2021-05-05 ENCOUNTER — Other Ambulatory Visit (HOSPITAL_COMMUNITY)
Admission: RE | Admit: 2021-05-05 | Discharge: 2021-05-05 | Disposition: A | Discharge status: Home / Self Care | Payer: Medicaid Other | Source: Ambulatory Visit | Attending: Student | Admitting: Student

## 2021-05-05 ENCOUNTER — Other Ambulatory Visit: Payer: Medicaid Other

## 2021-05-05 ENCOUNTER — Other Ambulatory Visit: Payer: Self-pay

## 2021-05-05 VITALS — BP 114/82 | HR 102 | Wt 195.8 lb

## 2021-05-05 DIAGNOSIS — O2242 Hemorrhoids in pregnancy, second trimester: Secondary | ICD-10-CM

## 2021-05-05 DIAGNOSIS — O0990 Supervision of high risk pregnancy, unspecified, unspecified trimester: Secondary | ICD-10-CM | POA: Diagnosis not present

## 2021-05-05 DIAGNOSIS — N898 Other specified noninflammatory disorders of vagina: Secondary | ICD-10-CM | POA: Insufficient documentation

## 2021-05-05 DIAGNOSIS — Z23 Encounter for immunization: Secondary | ICD-10-CM | POA: Diagnosis not present

## 2021-05-05 DIAGNOSIS — Z3A27 27 weeks gestation of pregnancy: Secondary | ICD-10-CM

## 2021-05-05 MED ORDER — HYDROCORT-PRAMOXINE (PERIANAL) 1-1 % EX FOAM: 1.0000 | Freq: Two times a day (BID) | CUTANEOUS | 1 refills | Status: DC

## 2021-05-05 MED ORDER — FAMOTIDINE 40 MG PO TABS: 40.0000 mg | ORAL_TABLET | Freq: Every day | ORAL | 1 refills | Status: DC

## 2021-05-05 NOTE — Patient Instructions (Signed)
AREA PEDIATRIC/FAMILY PRACTICE PHYSICIANS  [place] ([address]) [facility] Chambliss, MD; Eniola, MD; Hale, MD; Hensel, MD; McDiarmid, MD; McIntyer, MD; Neal, MD; Walden, MD [address] [place], [place] [address] [phone] Mon-Fri 8:30-12:30, 1:30-5:00 Providers come to see babies at [facility] Accepting Medicaid [facility] providers who accept newborns: Koirala, MD; Morrow, MD; Wolters, MD [address], [place], [place] [address] [phone] Mon-Fri 8:00-5:30 Babies seen by providers at [facility] Does NOT accept Medicaid Please call early in hospitalization for appointment (limited availability)  [facility] Mulberry, MD [address] [place], [place] [address] [phone] Mon, Tue, Thur, Fri 8:30-5:00, Wed 10:00-7:00 (closed 1-2pm) Babies seen by [facility] providers Accepting Medicaid Rubin - Pediatrician Rubin, MD [address], [place], [place] [address] [phone] Mon-Fri 8:30-5:00, Sat 8:30-12:00 Provider comes to see babies at [facility] Accepting Medicaid Must have been referred from current patients or contacted office prior to delivery [facility] ([facility]) Brown, MD; Chandler, MD; Ettefagh, MD; Grant, MD; Lester, MD; McCormick, MD; McQueen, MD; Prose, MD; Simha, MD; Stanley, MD; Stryffeler, NP; Tebben, NP [address], [place], [place] [address] [phone] Mon, Tue, Thur, Fri 8:30-5:30, Wed 9:30-5:30, Sat 8:30-12:30 Babies seen by [facility] providers Accepting Medicaid Only accepting infants of first-time parents or siblings of current patients Hospital discharge coordinator will make follow-up appointment Jack Amos [address], [place], [place]  [address] [phone]   Fax - [phone] [facility] [address], [place], [place]  [address] Phone - [phone]   Fax - [phone] Shilpa Gosrani [address], [place], [place]  [address] [phone]  [place] ([address]) [facility] Bates, MD; [place], MD; Cooper, Cox, MD; MD; Davis, MD; Dovico, MD; Ettefaugh, MD; Little, MD; Lowe, MD; Keiffer, MD; Melvin, MD; Sumner, MD; Williams, MD [address], [place], [place] [address] [phone] Mon-Fri 8:30-5:00 (extended evenings Mon-Thur as needed), Sat-Sun 10:00-1:00 Providers come to see babies at [facility] Accepting Medicaid for families of first-time babies and families with all children in the household age 3 and under. Must register with office prior to making appointment (M-F only). [facility] Henson, NP; Knapp, MD; Lalonde, MD; Tysinger, PA [address] [place], [place] [address] [phone] Mon-Fri 8:00-5:00 Babies seen by providers at [facility] Does NOT accept Medicaid/Commercial Insurance Only [facility] ([facility])  Artis, MD; Barnes, MD; Bratton, MD; Coccaro, MD; Lockett Gardner, MD; Kramer, MD; Marshall, MD; Netherton, MD; Poleto, MD; Skinner, MD [address] [place], [place] [address] [phone] Mon-Fri 8:30-5:30, Sat (Oct.-Mar.) 9:00-1:00 Babies seen by providers at [facility] Accepting Medicaid  [place] ([address]) [facility] Reid, MD; Warner, MD [address], [place], [place] [address] [phone] Mon-Fri 8:30-5:00, Sat 8:30-12:00 Providers come to see babies at [facility] Does NOT accept Medicaid [facility] Becker, PA; Hagler, MD; Scifres, PA; Sun, MD; Swayne, MD [address], ,  [address] [phone] Mon-Fri 8:00-5:00 Babies seen by providers at [facility] Does NOT accept Medicaid Only accepting babies of parents who  are patients Please call early in hospitalization for appointment (limited availability)  Pediatricians Clark, MD; Frye, MD; Kelleher, MD; Mack, NP; Miller, MD; O'Keller, MD; Patterson, NP; Pudlo, MD; Puzio, MD; Thomas, MD; Tucker, MD; Twiselton, MD [address], [place], [place] [address] [phone] Mon-Fri 8:00-5:00, Sat 9:00-12:00 Providers come to see babies at [facility] Does NOT accept Medicaid  [place] ([address]) [facility] providers accepting new patients: Brake, NP; Wharton, PA [address], [place], [place] [address] [phone] Mon-Fri 8:00-5:00 Babies seen by providers at [facility] Does NOT accept Medicaid Only accepting babies of parents who are patients Please call early in hospitalization for appointment (limited availability) [facility] Gay, MD; Quinlan, MD [address] [place], [place] [address] [phone] (press [phone] to schedule appointment) Mon-Fri 8:00-5:00 Providers come to see babies at [facility] Does NOT accept Medicaid [facility] Mazer, MD [address] [place], [place] [address] [phone] Mon-Fri 8:30-5:00 (lunch 12:30-1:00), extended hours by appointment only Wed 5:00-6:30 Babies seen by [facility] providers Accepting Medicaid [facility] Banks, MD; Jordan, MD; Koberlein, MD [address], [place], [place] [address] [phone] Mon-Fri 8:00-5:00 Babies seen by [facility] providers Does NOT accept Medicaid [facility] Parker, MD; Hunter, MD; Wallace, DO [address] [place], [place] [address] [phone] Mon-Fri 8:00-5:00 Babies seen by [facility] providers Does NOT accept Medicaid [facility] Brandon, PA; Brecken, PA; Christy, NP; Dees, MD; DeClaire, MD; DeWeese, MD; Hansen, NP; Mills, NP; Parrish, NP; Smoot, NP; Summer, MD; Vapne,  MD [address] [place], [place] [address] [phone] Mon-Fri 8:30-5:00, Sat 10:00-1:00 Providers come to see babies at [facility] Does NOT accept Medicaid Free prenatal information session Tuesdays at 4:45pm [facility] Bouska, MD; [place], PA; Jeffery, PA; Weber, PA [address] [place] [address] [phone] Mon-Fri 7:30-5:30 Babies seen by [facility] providers [facility] [address], [place], [place]  [address] [phone]   Fax - [phone]  [place] ([address] & [address]) [facility] [place] [address] [place], [place] [address] [phone] Mon-Thur 8:00-6:00 Providers come to see babies at [facility] Accepting Medicaid [facility] Anderson, NP; Badger, MD; Beal, PA; Spencer, PA [address] [place], [place] [address] [phone] Mon-Thur 7:30-7:30, Fri 7:30-4:30 Babies seen by [facility] providers Accepting Medicaid [facility] Agbuya, MD; Klett, NP; Romgoolam, MD [address], [place], [place] [address] [phone] Mon-Fri 8:30-5:00, Sat 8:30-12:00 Providers come to see babies at [facility] Accepting Medicaid Must have "Meet & Greet" appointment at office prior to delivery [facility] ([facility]) McCord, MD; Wallace, MD; Wood, MD [address], [place], [place] [address] [phone] Mon-Wed 8:00-6:00, Thur-Fri 8:00-5:00, Sat 9:00-12:00 Providers come to see babies at [facility] Does NOT accept Medicaid Only accepting siblings of current patients [facility]  [address], [place], [place]  [address] [phone]   Fax - [phone] [facility] [address], [place], [place]  [address] [phone]   Fax -  [phone]  [place] ([address] & [address]) [facility] Cirigliano, DO; Matthews, DO [address] [place], [place] [address] [phone] Mon-Fri 7:00-5:00 Babies seen by [facility] providers Does NOT accept Medicaid [facility] Briscoe, MD; Howley, PA; Moreira, PA [address], [place], [place] [address] [phone] Mon-Fri 8:00-5:00 Babies seen by [facility] providers Accepting Medicaid [facility] Boyd, MD; Church, PA; Jones, NP; Osborn, PA [address], , [place] [address] [phone] Mon-Fri 8:00-5:00 Babies seen by providers at [place] [phone]) [facility] [place], [place] [address] Madelaine Bhat [place], [place] [address] [phone] Mon-Fri 8:00-5:00 Babies seen by [facility] providers Does NOT accept Medicaid Limited availability, please call early in hospitalization to schedule follow-up [facility] Kennedy Bucker, [place]; Maisie Fus, MD; Charlesetta Garibaldi, MD; [place], [place]; Jeannine Kitten, MD; [place], [place] [address], [place], [place] [address] [phone] Mon-Fri 8:30-5:00, Sat 9:00-12:00 Babies seen by providers at [facility] Accepting Medicaid Please register online then schedule online or call office [email or web address] ([facility] [place]) Yong [place], [place]; Dwyane Dee, MD; Leonidas [place], [place] [address] [place], [place], [place] [address] [phone] Mon-Fri 8:00-5:00 Babies seen by providers at [place] ([facility]) Rosman, MD; Rayvon Char, NP; Melina Modena, MD [address] [place], [place], [place] [address] [phone] Mon-Fri 8:00-5:30, Sat&Sun by appointment (phones open at  8:30) Babies seen by [facility] providers Accepting Medicaid Must be a first-time baby or sibling of current patient Ada  [address], [place], [place]  [address] [phone]   Fax - [phone]  [place] [phone] & [phone]) [place], [place]; [place], [place]; Barnes, MD; [place], [place]; Redby, MD [address] [place], [place] [address] [phone] Mon-Thur 8:00-7:00, Fri 8:00-5:00, Sat 8:00-12:00, Sun 9:00-12:00 Babies seen by [facility] providers Accepting Medicaid [facility] [employer], MD; Ruthann Cancer, MD; [facility], MD [address], [place], [place] [address] [phone] Mon-Thur 8:00-5:00 Babies seen by providers at [facility] Accepting Medicaid [facility] [place] [facility] [place] Molly, MD; Coe-Goins, MD; Amedeo Plenty, MD; Bobby Rumpf, MD; List, MD; Lavonia Drafts, MD; Ruthann Cancer, MD; Selinda Eon, MD; Audie Box, MD; Jim Like, MD; Christie Nottingham, MD; Hubbard Hartshorn, MD; Modena Nunnery, MD [address] Barbara Cower [place], [place] [address] [phone] Mon-Fri 8:00-5:30, Sat (Oct.-Mar.) 9:00-1:00 Babies seen by providers at [facility] Accepting Medicaid Must fill out new patient packet, available online at [email or web address] [place] ([facility]) Fredderick Severance, NP; Kenton Kingfisher, NP; Claiborne Billings, NP; Rolla Plate, MD; [employer], [place]; Carola Rhine, MD; [place], MD; Delia Chimes, NP [address], [place], [place] [address] [phone] Mon-Thur 8:00-5:30, Fri 8:00-5:00 Babies seen by providers at [facility] Accepting [facility]

## 2021-05-05 NOTE — Progress Notes (Signed)
° °  PRENATAL VISIT NOTE  Subjective:  Debra Bernard is a 36 y.o. [id number] at [redacted]w[redacted]d being seen today for ongoing prenatal care.  She is currently monitored for the following issues for this low-risk pregnancy and has AMA (advanced maternal age) primigravida 21+ and Supervision of high risk pregnancy, antepartum on their problem list.  Patient reports  occasional heartburn . She reports back pain. She reports some vaginal itching.  She wants some proctofoam in case her hemorroids worsen. Proctofoam worked for her. She denies abnormal discharge, odor. She had a prescription for Terazol which she took and it got better, but now she is feeling the itching again.  Contractions: Not present. Vag. Bleeding: None.  Movement: Present. Denies leaking of fluid.   The following portions of the patient's history were reviewed and updated as appropriate: allergies, current medications, past family history, past medical history, past social history, past surgical history and problem list.   Objective:   Vitals:   05/05/21 0837  BP: 114/82  Pulse: (!) 102  Weight: 195 lb 12.8 oz (88.8 kg)    Fetal Status: Fetal Heart Rate (bpm): 156 Fundal Height: 30 cm Movement: Present     General:  Alert, oriented and cooperative. Patient is in no acute distress.  Skin: Skin is warm and dry. No rash noted.   Cardiovascular: Normal heart rate noted  Respiratory: Normal respiratory effort, no problems with respiration noted  Abdomen: Soft, gravid, appropriate for gestational age.  Pain/Pressure: Present     Pelvic: Cervical exam deferred        Extremities: Normal range of motion.  Edema: None  Mental Status: Normal mood and affect. Normal behavior. Normal judgment and thought content.   Assessment and Plan:  Pregnancy: [id number] at [redacted]w[redacted]d 1. Supervision of high risk pregnancy, antepartum -2 hour in process  -list of pediatricians given - Tdap vaccine greater than or equal to 7yo IM -blood pressure is normal today; no  symptoms today -repeat swabs today and treat per protocol Patient requesting hemorroid cream; refill given on proctofoam Preterm labor symptoms and general obstetric precautions including but not limited to vaginal bleeding, contractions, leaking of fluid and fetal movement were reviewed in detail with the patient. Please refer to After Visit Summary for other counseling recommendations.   Return in about 2 weeks (around 05/19/2021), or LROB.  Future Appointments  Date Time Provider [place]  05/30/2021  8:30 AM [facility] NURSE [facility]  05/30/2021  8:45 AM [facility] US4 [facility] [place], CNM

## 2021-05-06 LAB — CBC
Hematocrit: 32.6 % — ABNORMAL LOW (ref 34.0–46.6)
Hemoglobin: 11 g/dL — ABNORMAL LOW (ref 11.1–15.9)
MCH: 29.7 pg (ref 26.6–33.0)
MCHC: 33.7 g/dL (ref 31.5–35.7)
MCV: 88 fL (ref 79–97)
Platelets: 191 10*3/uL (ref 150–450)
RBC: 3.7 x10E6/uL — ABNORMAL LOW (ref 3.77–5.28)
RDW: 13 % (ref 11.7–15.4)
WBC: 5.8 10*3/uL (ref 3.4–10.8)

## 2021-05-06 LAB — GLUCOSE TOLERANCE, 2 HOURS W/ 1HR
Glucose, 1 hour: 166 mg/dL (ref 70–179)
Glucose, 2 hour: 147 mg/dL (ref 70–152)
Glucose, Fasting: 90 mg/dL (ref 70–91)

## 2021-05-06 LAB — CERVICOVAGINAL ANCILLARY ONLY
Bacterial Vaginitis (gardnerella): POSITIVE — AB
Candida Glabrata: NEGATIVE
Candida Vaginitis: NEGATIVE
Comment: NEGATIVE
Comment: NEGATIVE
Comment: NEGATIVE
Comment: NEGATIVE
Trichomonas: NEGATIVE

## 2021-05-06 LAB — HIV ANTIBODY (ROUTINE TESTING W REFLEX): HIV Screen 4th Generation wRfx: NONREACTIVE

## 2021-05-06 LAB — RPR: RPR Ser Ql: NONREACTIVE

## 2021-05-09 ENCOUNTER — Other Ambulatory Visit: Payer: Self-pay | Admitting: Student

## 2021-05-09 MED ORDER — METRONIDAZOLE 500 MG PO TABS: 500.0000 mg | ORAL_TABLET | Freq: Two times a day (BID) | ORAL | 0 refills | Status: DC

## 2021-05-20 ENCOUNTER — Encounter: Payer: Self-pay | Admitting: Family Medicine

## 2021-05-20 ENCOUNTER — Other Ambulatory Visit: Payer: Self-pay

## 2021-05-20 ENCOUNTER — Ambulatory Visit (INDEPENDENT_AMBULATORY_CARE_PROVIDER_SITE_OTHER): Payer: Medicaid Other | Admitting: Family Medicine

## 2021-05-20 VITALS — BP 127/82 | HR 111 | Wt 194.8 lb

## 2021-05-20 DIAGNOSIS — R519 Headache, unspecified: Secondary | ICD-10-CM

## 2021-05-20 DIAGNOSIS — O2242 Hemorrhoids in pregnancy, second trimester: Secondary | ICD-10-CM

## 2021-05-20 DIAGNOSIS — O09513 Supervision of elderly primigravida, third trimester: Secondary | ICD-10-CM

## 2021-05-20 DIAGNOSIS — O0990 Supervision of high risk pregnancy, unspecified, unspecified trimester: Secondary | ICD-10-CM

## 2021-05-20 MED ORDER — CYCLOBENZAPRINE HCL 5 MG PO TABS: 5.0000 mg | ORAL_TABLET | Freq: Three times a day (TID) | ORAL | 0 refills | Status: DC | PRN

## 2021-05-20 MED ORDER — HYDROCORT-PRAMOXINE (PERIANAL) 1-1 % EX FOAM: 1.0000 | Freq: Two times a day (BID) | CUTANEOUS | 1 refills | Status: DC

## 2021-05-20 NOTE — Patient Instructions (Signed)

## 2021-05-20 NOTE — Progress Notes (Signed)
° °  Subjective:  Debra Bernard is a 36 y.o. [id number] at [redacted]w[redacted]d being seen today for ongoing prenatal care.  She is currently monitored for the following issues for this low-risk pregnancy and has AMA (advanced maternal age) primigravida 1+ and Supervision of high risk pregnancy, antepartum on their problem list.  Patient reports no complaints.  Contractions: Not present. Vag. Bleeding: None.  Movement: Present. Denies leaking of fluid.   The following portions of the patient's history were reviewed and updated as appropriate: allergies, current medications, past family history, past medical history, past social history, past surgical history and problem list. Problem list updated.  Objective:   Vitals:   05/20/21 1119  BP: 127/82  Pulse: (!) 111  Weight: 194 lb 12.8 oz (88.4 kg)    Fetal Status: Fetal Heart Rate (bpm): 164   Movement: Present     General:  Alert, oriented and cooperative. Patient is in no acute distress.  Skin: Skin is warm and dry. No rash noted.   Cardiovascular: Normal heart rate noted  Respiratory: Normal respiratory effort, no problems with respiration noted  Abdomen: Soft, gravid, appropriate for gestational age. Pain/Pressure: Present     Pelvic: Vag. Bleeding: None     Cervical exam deferred        Extremities: Normal range of motion.  Edema: None  Mental Status: Normal mood and affect. Normal behavior. Normal judgment and thought content.   Urinalysis:      Assessment and Plan:  Pregnancy: [id number] at [redacted]w[redacted]d  1. Supervision of high risk pregnancy, antepartum BP and FHR normal Having some viral symptoms, advised to get flu and covid testing Also having headache, BP normal, tylenol not effective, trial flexeril  2. Primigravida of advanced maternal age in third trimester   Preterm labor symptoms and general obstetric precautions including but not limited to vaginal bleeding, contractions, leaking of fluid and fetal movement were reviewed in detail with the  patient. Please refer to After Visit Summary for other counseling recommendations.  Return in 2 weeks (on 06/03/2021) for [facility], ob visit.   Clarnce Flock, MD

## 2021-05-20 NOTE — Progress Notes (Signed)
Pt states having headaches and dry cough x 2 days with Tylenol and using 1g twice a day. Has not tested for Covid or flu. Denies fever.

## 2021-05-21 ENCOUNTER — Other Ambulatory Visit: Payer: Self-pay | Admitting: Family Medicine

## 2021-05-30 ENCOUNTER — Ambulatory Visit: Payer: Medicaid Other | Attending: Obstetrics and Gynecology

## 2021-05-30 ENCOUNTER — Encounter: Payer: Self-pay | Admitting: *Deleted

## 2021-05-30 ENCOUNTER — Ambulatory Visit: Payer: Medicaid Other | Admitting: *Deleted

## 2021-05-30 ENCOUNTER — Other Ambulatory Visit: Payer: Self-pay

## 2021-05-30 VITALS — BP 116/72 | HR 106

## 2021-05-30 DIAGNOSIS — O99213 Obesity complicating pregnancy, third trimester: Secondary | ICD-10-CM | POA: Diagnosis not present

## 2021-05-30 DIAGNOSIS — Z6831 Body mass index (BMI) 31.0-31.9, adult: Secondary | ICD-10-CM | POA: Insufficient documentation

## 2021-05-30 DIAGNOSIS — O0990 Supervision of high risk pregnancy, unspecified, unspecified trimester: Secondary | ICD-10-CM

## 2021-05-30 DIAGNOSIS — O09513 Supervision of elderly primigravida, third trimester: Secondary | ICD-10-CM | POA: Insufficient documentation

## 2021-05-30 DIAGNOSIS — E669 Obesity, unspecified: Secondary | ICD-10-CM | POA: Diagnosis present

## 2021-05-30 DIAGNOSIS — O99212 Obesity complicating pregnancy, second trimester: Secondary | ICD-10-CM | POA: Diagnosis present

## 2021-05-30 DIAGNOSIS — Z3A31 31 weeks gestation of pregnancy: Secondary | ICD-10-CM | POA: Diagnosis not present

## 2021-05-30 DIAGNOSIS — O09523 Supervision of elderly multigravida, third trimester: Secondary | ICD-10-CM | POA: Diagnosis not present

## 2021-06-03 ENCOUNTER — Encounter: Payer: Self-pay | Admitting: Medical

## 2021-06-03 ENCOUNTER — Ambulatory Visit (INDEPENDENT_AMBULATORY_CARE_PROVIDER_SITE_OTHER): Payer: Medicaid Other | Admitting: Medical

## 2021-06-03 ENCOUNTER — Other Ambulatory Visit: Payer: Self-pay

## 2021-06-03 VITALS — BP 113/70 | HR 105 | Wt 196.8 lb

## 2021-06-03 DIAGNOSIS — N898 Other specified noninflammatory disorders of vagina: Secondary | ICD-10-CM

## 2021-06-03 DIAGNOSIS — Z3A31 31 weeks gestation of pregnancy: Secondary | ICD-10-CM

## 2021-06-03 DIAGNOSIS — O2242 Hemorrhoids in pregnancy, second trimester: Secondary | ICD-10-CM

## 2021-06-03 DIAGNOSIS — R519 Headache, unspecified: Secondary | ICD-10-CM

## 2021-06-03 DIAGNOSIS — O09513 Supervision of elderly primigravida, third trimester: Secondary | ICD-10-CM

## 2021-06-03 DIAGNOSIS — O0990 Supervision of high risk pregnancy, unspecified, unspecified trimester: Secondary | ICD-10-CM

## 2021-06-03 MED ORDER — CYCLOBENZAPRINE HCL 5 MG PO TABS: 5.0000 mg | ORAL_TABLET | Freq: Three times a day (TID) | ORAL | 0 refills | Status: DC | PRN

## 2021-06-03 MED ORDER — HYDROCORT-PRAMOXINE (PERIANAL) 1-1 % EX FOAM: 1.0000 | Freq: Two times a day (BID) | CUTANEOUS | 1 refills | Status: DC

## 2021-06-03 NOTE — Patient Instructions (Signed)
AREA PEDIATRIC/FAMILY PRACTICE PHYSICIANS  [place] ([address]) [facility] Chambliss, MD; Eniola, MD; Hale, MD; Hensel, MD; McDiarmid, MD; McIntyer, MD; Neal, MD; Walden, MD [address] [place], [place] [address] [phone] Mon-Fri 8:30-12:30, 1:30-5:00 Providers come to see babies at [facility] Accepting Medicaid [facility] providers who accept newborns: Koirala, MD; Morrow, MD; Wolters, MD [address], [place], [place] [address] [phone] Mon-Fri 8:00-5:30 Babies seen by providers at [facility] Does NOT accept Medicaid Please call early in hospitalization for appointment (limited availability)  [facility] Mulberry, MD [address] [place], [place] [address] [phone] Mon, Tue, Thur, Fri 8:30-5:00, Wed 10:00-7:00 (closed 1-2pm) Babies seen by [facility] providers Accepting Medicaid Rubin - Pediatrician Rubin, MD [address], [place], [place] [address] [phone] Mon-Fri 8:30-5:00, Sat 8:30-12:00 Provider comes to see babies at [facility] Accepting Medicaid Must have been referred from current patients or contacted office prior to delivery [facility] ([facility]) Brown, MD; Chandler, MD; Ettefagh, MD; Grant, MD; Lester, MD; McCormick, MD; McQueen, MD; Prose, MD; Simha, MD; Stanley, MD; Stryffeler, NP; Tebben, NP [address], [place], [place] [address] [phone] Mon, Tue, Thur, Fri 8:30-5:30, Wed 9:30-5:30, Sat 8:30-12:30 Babies seen by [facility] providers Accepting Medicaid Only accepting infants of first-time parents or siblings of current patients Hospital discharge coordinator will make follow-up appointment Jack Amos [address], [place], [place]  [address] [phone]   Fax - [phone] [facility] [address], [place], [place]  [address] Phone - [phone]   Fax - [phone] Shilpa Gosrani [address], [place], [place]  [address] [phone]  [place] ([address]) [facility] Bates, MD; Brassfield, MD; Cooper, Cox, MD; MD; Davis, MD; Dovico, MD; Ettefaugh, MD; Little, MD; Lowe, MD; Keiffer, MD; Melvin, MD; Sumner, MD; Williams, MD [address], [place], [place] [address] [phone] Mon-Fri 8:30-5:00 (extended evenings Mon-Thur as needed), Sat-Sun 10:00-1:00 Providers come to see babies at [facility] Accepting Medicaid for families of first-time babies and families with all children in the household age 3 and under. Must register with office prior to making appointment (M-F only). [facility] Henson, NP; Knapp, MD; Lalonde, MD; Tysinger, PA [address] [place], [place] [address] [phone] Mon-Fri 8:00-5:00 Babies seen by providers at [facility] Does NOT accept Medicaid/Commercial Insurance Only [facility] ([facility])  Artis, MD; Barnes, MD; Bratton, MD; Coccaro, MD; Lockett Gardner, MD; Kramer, MD; Marshall, MD; Netherton, MD; Poleto, MD; Skinner, MD [address] [place], [place] [address] [phone] Mon-Fri 8:30-5:30, Sat (Oct.-Mar.) 9:00-1:00 Babies seen by providers at [facility] Accepting Medicaid  [place] ([address]) [facility] Reid, MD; Warner, MD [address], [place], [place] [address] [phone] Mon-Fri 8:30-5:00, Sat 8:30-12:00 Providers come to see babies at [facility] Does NOT accept Medicaid [facility] Becker, PA; Hagler, MD; Scifres, PA; Sun, MD; Swayne, MD [address], [place],  [address] [phone] Mon-Fri 8:00-5:00 Babies seen by providers at [facility] Does NOT accept Medicaid Only accepting babies of parents who  are patients Please call early in hospitalization for appointment (limited availability)  Pediatricians Clark, MD; Frye, MD; Kelleher, MD; Mack, NP; Miller, MD; O'Keller, MD; [place], NP; Pudlo, MD; Puzio, MD; Thomas, MD; Tucker, MD; Twiselton, MD [address], [place], [place] [address] [phone] Mon-Fri 8:00-5:00, Sat 9:00-12:00 Providers come to see babies at [facility] Does NOT accept Medicaid  [place] ([address]) [facility] providers accepting new patients: Brake, NP; Wharton, PA [address], [place], [place] [address] [phone] Mon-Fri 8:00-5:00 Babies seen by providers at [facility] Does NOT accept Medicaid Only accepting babies of parents who are patients Please call early in hospitalization for appointment (limited availability) [facility] Gay, MD; Quinlan, MD [address] [place], [place] [address] [phone] (press 1 to schedule appointment) Mon-Fri 8:00-5:00 Providers come to see babies at [facility] Does NOT accept Medicaid [facility] Mazer, MD [address] [place], [place] [address] [phone] Mon-Fri 8:30-5:00 (lunch 12:30-1:00), extended hours by appointment only Wed 5:00-6:30 Babies seen by [facility] providers Accepting Medicaid [facility] Banks, MD; Jordan, MD; Koberlein, MD [address], [place], [place] [address] [phone] Mon-Fri 8:00-5:00 Babies seen by [facility] providers Does NOT accept Medicaid [facility] Parker, MD; Hunter, MD; Wallace, DO [address] [place], [place] [address] [phone] Mon-Fri 8:00-5:00 Babies seen by [facility] providers Does NOT accept Medicaid [facility] Brandon, PA; Brecken, PA; Christy, NP; Dees, MD; DeClaire, MD; DeWeese, MD; Hansen, NP; Mills, NP; Parrish, NP; Smoot, NP; Summer, MD; Vapne,  MD [address] [place], [place] [address] [phone] Mon-Fri 8:30-5:00, Sat 10:00-1:00 Providers come to see babies at [facility] Does NOT accept Medicaid Free prenatal information session Tuesdays at 4:45pm [facility] Bouska, MD; Gordon, PA; Jeffery, PA; Weber, PA [address] [place] [address] [phone] Mon-Fri 7:30-5:30 Babies seen by [facility] providers [facility] [address], [place], [place]  [address] [phone]   Fax - [phone]  [place] ([address] & [address]) [facility] [place] [address] [place], [place] [address] [phone] Mon-Thur 8:00-6:00 Providers come to see babies at [facility] Accepting Medicaid [facility] Anderson, NP; Badger, MD; Beal, PA; Spencer, PA [address] [place], [place] [address] [phone] Mon-Thur 7:30-7:30, Fri 7:30-4:30 Babies seen by [facility] providers Accepting Medicaid [facility] Agbuya, MD; Klett, NP; Romgoolam, MD [address], [place], [place] [address] [phone] Mon-Fri 8:30-5:00, Sat 8:30-12:00 Providers come to see babies at [facility] Accepting Medicaid Must have "Meet & Greet" appointment at office prior to delivery [facility] ([facility]) McCord, MD; Wallace, MD; Wood, MD [address], [place], [place] [address] [phone] Mon-Wed 8:00-6:00, Thur-Fri 8:00-5:00, Sat 9:00-12:00 Providers come to see babies at [facility] Does NOT accept Medicaid Only accepting siblings of current patients [facility]  [address], [place], [place]  [address] [phone]   Fax - [phone] [facility] [address], [place], [place]  [address] [phone]   Fax -  [phone]  [place] ([address] & [address]) [facility] Cirigliano, DO; Matthews, DO [address] [place], [place] [address] [phone] Mon-Fri 7:00-5:00 Babies seen by [facility] providers Does NOT accept Medicaid [facility] Briscoe, MD; Howley, PA; Moreira, PA [address], [place], [place] [address] [phone] Mon-Fri 8:00-5:00 Babies seen by [facility] providers Accepting Medicaid [facility] Boyd, MD; Church, PA; Jones, NP; Osborn, PA [address], , [place] [address] [phone] Mon-Fri 8:00-5:00 Babies seen by providers at [facility] Accepting Medicaid  [place] ([address]) [facility] [place] Wendling, DO [address] [place], [place] [address] [phone] Mon-Fri 8:00-5:00 Babies seen by [facility] providers Does NOT accept Medicaid Limited availability, please call early in hospitalization to schedule follow-up [facility] Calderon, PA; Cummings, MD; Dillard, MD; Martin, PA; Olson, MD; VanDeven, PA [address], [place], [place] [address] [phone] Mon-Fri 8:30-5:00, Sat 9:00-12:00 Babies seen by providers at [facility] Accepting Medicaid Please register online then schedule online or call office [email or web address]) Hunter, NP; Kumar, MD; Martin Rogers, PA [address], [place], [place] [address] [phone] Mon-Fri 8:00-5:00 Babies seen by providers at [facility] Accepting Medicaid [facility] ([facility]) [place], MD; Kristi Fleenor, NP; West, MD [address], [place], [place] [address] [phone] Mon-Fri 8:00-5:30, Sat&Sun by appointment (phones open at  8:30) Babies seen by [facility] providers Accepting Medicaid Must be a first-time baby or sibling of current patient [facility] [place]  [address], [place], [place]  [address] [phone]   Fax - [phone]  [place] ([address]) [facility] Brown, PA; Cowen, PA; Rice, MD; Helton, PA; Spry, MD [address] [place], [place] [address] [phone] Mon-Thur 8:00-7:00, Fri 8:00-5:00, Sat 8:00-12:00, Sun 9:00-12:00 Babies seen by [facility] providers Accepting Medicaid [facility] Coe-Goins, MD; Marshall, MD; Pierre-Louis, MD [address], [place], [place] [address] [phone] Mon-Thur 8:00-5:00 Babies seen by providers at [facility] Accepting Medicaid [facility] Bratton, MD; Coe-Goins, MD; Hayes, MD; Lewis, MD; List, MD; Lott, MD; Marshall, MD; Moran, MD; O'Neal, MD; Pierre-Louis, MD; Pitonzo, MD; Scholer, MD; Spangle, MD [address] [place], [place] [address] [phone] Mon-Fri 8:00-5:30, Sat (Oct.-Mar.) 9:00-1:00 Babies seen by providers at [facility] Accepting Medicaid Must fill out new patient packet, available online at [email or web address] [facility] ([facility]) Friddle, NP; Harris, NP; Kelly, NP; Logan, MD; Melvin, PA; Poth, MD; Ramadoss, MD; Stanton, NP [address], [place], [place] [address] [phone] Mon-Thur 8:00-5:30, Fri 8:00-5:00 Babies seen by providers at [facility] Accepting Medicaid  [place] ([address]) [facility] Dixon, PA; [place], MD; Pickard, MD; Tapia, PA [address], [place], [place] [address] [phone] Mon-Fri 8:00-5:00 Babies seen by providers at [facility] Accepting Medicaid   [place] ([address]) [facility] [place] Masneri, DO; Meyers, MD; Nelson, PA [address], [place], [place] [address] [phone] Mon-Fri 8:00-5:00 Babies seen by providers at [facility] Does NOT accept Medicaid Limited appointment availability, please call early in hospitalization   [facility] [place] Kunedd, DO; McGowen, MD [address], [place], [place] [address] [phone] Mon-Fri 8:00-5:00 Babies seen by [facility] providers Does NOT accept Medicaid [facility] [place] Cameron, MD; MacDonald, MD; Michaels, PA; Nayak, MD [address] [place] [address], [place], [place] [address] [phone] Mon-Fri 8:00-5:00 After hours clinic ([address] [place],  [address]) [phone] Mon-Fri 5:00-8:00, Sat 12:00-6:00, Sun 10:00-4:00 Babies seen by [facility] providers Accepting Medicaid [facility] [place] [address], [place], [place]  [address] [phone]   Fax - [phone]  [place] ([address]) [facility] [place] [facility] Andy, MD [address], [place], [place] [address] [phone] Mon-Fri 8:00-5:00 Babies seen by [facility] providers Does NOT accept Medicaid [facility] [place] ([facility] [place]) Eksir, MD [address], [place], [place] [address] [phone] Mon-Thur 8:00-7:00, Fri 8:00-5:00, Sat 8:00-12:00 Babies seen by providers at [facility] Accepting Medicaid - but does not have vaccinations in office (must be received elsewhere) Limited availability, please call early in hospitalization  [place] ([address]) [facility]  Charlene Flemming, MD [address],  [place] [address] [phone]  Fax [phone]  [facility]  Kimberly Newton, MD, Annamarie Streilein, PA, Carla [place], PA [address] [place], [place]  [address] [phone] [facility]  [address], [place], [place] [address] [phone] [address], [place], [place] [address] [phone] (West Office)  [facility] [address], [place], [place] [address] [phone] [facility] [address], [place], [place] [address] [phone] [facility] [address], [place], [place] [address] [phone] [facility] [address], [place], [place] [address] [phone] [facility] [address], [place], [place] [address] [phone] [facility] [address], [place], [place] [address] [phone] [facility]  [address], [place], [place] [address] [phone] Dr[facility] [address], [place], [place] [address] [phone] [place] [facility] [address], [place], [place] [address] [phone] [facility] [address], ,  [address] [phone]  

## 2021-06-03 NOTE — Progress Notes (Signed)
° °  PRENATAL VISIT NOTE  Subjective:  Debra Bernard is a 36 y.o. [id number] at [redacted]w[redacted]d being seen today for ongoing prenatal care.  She is currently monitored for the following issues for this high-risk pregnancy and has AMA (advanced maternal age) primigravida 65+ and Supervision of high risk pregnancy, antepartum on their problem list.  Patient reports vaginal irritation.  Contractions: Irritability. Vag. Bleeding: None.  Movement: Present. Denies leaking of fluid.   The following portions of the patient's history were reviewed and updated as appropriate: allergies, current medications, past family history, past medical history, past social history, past surgical history and problem list.   Objective:   Vitals:   06/03/21 1047  BP: 113/70  Pulse: (!) 105  Weight: 196 lb 12.8 oz (89.3 kg)    Fetal Status: Fetal Heart Rate (bpm): 160 Fundal Height: 32 cm Movement: Present     General:  Alert, oriented and cooperative. Patient is in no acute distress.  Skin: Skin is warm and dry. No rash noted.   Cardiovascular: Normal heart rate noted  Respiratory: Normal respiratory effort, no problems with respiration noted  Abdomen: Soft, gravid, appropriate for gestational age.  Pain/Pressure: Present     Pelvic: Cervical exam deferred        Extremities: Normal range of motion.  Edema: Trace  Mental Status: Normal mood and affect. Normal behavior. Normal judgment and thought content.   Assessment and Plan:  Pregnancy: [id number] at [redacted]w[redacted]d 1. Supervision of high risk pregnancy, antepartum - Follow-up US reviewed with patient   2. Primigravida of advanced maternal age in third trimester - < 40  3. Vaginal itching - CMA called to confirm previous Rx received by pharmacy   4. Hemorrhoids during pregnancy in second trimester - hydrocortisone-pramoxine (PROCTOFOAM-HC) rectal foam; Place 1 applicator rectally 2 (two) times daily.  Dispense: 10 g; Refill: 1  5. [redacted] weeks gestation of pregnancy  6.  Nonintractable headache, unspecified chronicity pattern, unspecified headache type - cyclobenzaprine (FLEXERIL) 5 MG tablet; Take 1 tablet (5 mg total) by mouth 3 (three) times daily as needed for muscle spasms.  Dispense: 30 tablet; Refill: 0  Preterm labor symptoms and general obstetric precautions including but not limited to vaginal bleeding, contractions, leaking of fluid and fetal movement were reviewed in detail with the patient. Please refer to After Visit Summary for other counseling recommendations.   Return in about 2 weeks (around 06/17/2021) for LOB, In-Person.  Future Appointments  Date Time Provider [place]  06/21/2021 11:15 AM Clarnce Flock, MD [facility]    Kerry Hough, PA-C

## 2021-06-21 ENCOUNTER — Ambulatory Visit (INDEPENDENT_AMBULATORY_CARE_PROVIDER_SITE_OTHER): Payer: Medicaid Other | Admitting: Family Medicine

## 2021-06-21 ENCOUNTER — Encounter: Payer: Self-pay | Admitting: Family Medicine

## 2021-06-21 ENCOUNTER — Other Ambulatory Visit: Payer: Self-pay

## 2021-06-21 VITALS — BP 119/82 | HR 101 | Wt 198.9 lb

## 2021-06-21 DIAGNOSIS — O2242 Hemorrhoids in pregnancy, second trimester: Secondary | ICD-10-CM

## 2021-06-21 DIAGNOSIS — O09513 Supervision of elderly primigravida, third trimester: Secondary | ICD-10-CM

## 2021-06-21 DIAGNOSIS — O0990 Supervision of high risk pregnancy, unspecified, unspecified trimester: Secondary | ICD-10-CM

## 2021-06-21 MED ORDER — FAMOTIDINE 40 MG PO TABS: 40.0000 mg | ORAL_TABLET | Freq: Every day | ORAL | 5 refills | Status: DC

## 2021-06-21 MED ORDER — POLYETHYLENE GLYCOL 3350 17 GM/SCOOP PO POWD: 17.0000 g | Freq: Every day | ORAL | 1 refills | Status: AC | PRN

## 2021-06-21 MED ORDER — HYDROCORT-PRAMOXINE (PERIANAL) 1-1 % EX FOAM: 1.0000 | Freq: Two times a day (BID) | CUTANEOUS | 1 refills | Status: DC

## 2021-06-21 NOTE — Patient Instructions (Signed)

## 2021-06-21 NOTE — Progress Notes (Signed)
? ?  Subjective:  ?Debra Bernard is a 36 y.o. [id number] at 59w1dbeing seen today for ongoing prenatal care.  She is currently monitored for the following issues for this low-risk pregnancy and has AMA (advanced maternal age) primigravida 321+and Supervision of high risk pregnancy, antepartum on their problem list. ? ?Patient reports no complaints.  Contractions: Not present. Vag. Bleeding: None.  Movement: Present. Denies leaking of fluid.  ? ?The following portions of the patient's history were reviewed and updated as appropriate: allergies, current medications, past family history, past medical history, past social history, past surgical history and problem list. Problem list updated. ? ?Objective:  ? ?Vitals:  ? 06/21/21 1130  ?BP: 119/82  ?Pulse: (!) 101  ?Weight: 198 lb 14.4 oz (90.2 kg)  ? ? ?Fetal Status: Fetal Heart Rate (bpm): 162   Movement: Present    ? ?General:  Alert, oriented and cooperative. Patient is in no acute distress.  ?Skin: Skin is warm and dry. No rash noted.   ?Cardiovascular: Normal heart rate noted  ?Respiratory: Normal respiratory effort, no problems with respiration noted  ?Abdomen: Soft, gravid, appropriate for gestational age. Pain/Pressure: Present     ?Pelvic: Vag. Bleeding: None     ?Cervical exam deferred        ?Extremities: Normal range of motion.     ?Mental Status: Normal mood and affect. Normal behavior. Normal judgment and thought content.  ? ?Urinalysis:     ? ?Assessment and Plan:  ?Pregnancy: [id number] 361w1d ?1. Supervision of high risk pregnancy, antepartum ?BP and FHR normal ?Still having problems with hemorrhoids, refill given for proctofoam and miralax rx sent to help treat underlying issue ?Discussed contraception, would like post placental IUD ?Form for dentist signed ? ?2. Primigravida of advanced maternal age in third trimester ? ? ?Preterm labor symptoms and general obstetric precautions including but not limited to vaginal bleeding, contractions, leaking of  fluid and fetal movement were reviewed in detail with the patient. ?Please refer to After Visit Summary for other counseling recommendations.  ?Return in 2 weeks (on 07/05/2021) [facility] ? ?EcClarnce FlockMD ? ?

## 2021-06-21 NOTE — Progress Notes (Signed)
Pt requesting refill on Protofoam.  ?

## 2021-06-25 ENCOUNTER — Other Ambulatory Visit: Payer: Self-pay | Admitting: Medical

## 2021-06-25 DIAGNOSIS — R519 Headache, unspecified: Secondary | ICD-10-CM

## 2021-07-05 ENCOUNTER — Other Ambulatory Visit: Payer: Self-pay

## 2021-07-05 DIAGNOSIS — R519 Headache, unspecified: Secondary | ICD-10-CM

## 2021-07-05 MED ORDER — CYCLOBENZAPRINE HCL 5 MG PO TABS: 5.0000 mg | ORAL_TABLET | Freq: Three times a day (TID) | ORAL | 0 refills | Status: DC | PRN

## 2021-07-05 NOTE — Telephone Encounter (Signed)
Per Kerry Hough, PA pt can have a refill on Flexeril.  Pt notified that her request for Flexeril has been sent to her [place] [facility] [place].  Pt verbalized understanding.  ? Mel Al[place], RN  ?07/05/21 ?

## 2021-07-06 ENCOUNTER — Other Ambulatory Visit (HOSPITAL_COMMUNITY)
Admission: RE | Admit: 2021-07-06 | Discharge: 2021-07-06 | Disposition: A | Discharge status: Home / Self Care | Payer: Medicaid Other | Source: Ambulatory Visit | Attending: Obstetrics & Gynecology | Admitting: Obstetrics & Gynecology

## 2021-07-06 ENCOUNTER — Encounter: Payer: Self-pay | Admitting: Obstetrics & Gynecology

## 2021-07-06 ENCOUNTER — Other Ambulatory Visit: Payer: Self-pay

## 2021-07-06 ENCOUNTER — Ambulatory Visit (INDEPENDENT_AMBULATORY_CARE_PROVIDER_SITE_OTHER): Payer: Medicaid Other | Admitting: Obstetrics & Gynecology

## 2021-07-06 VITALS — BP 126/85 | HR 111 | Wt 198.1 lb

## 2021-07-06 DIAGNOSIS — O09513 Supervision of elderly primigravida, third trimester: Secondary | ICD-10-CM | POA: Diagnosis present

## 2021-07-06 DIAGNOSIS — O321XX0 Maternal care for breech presentation, not applicable or unspecified: Secondary | ICD-10-CM | POA: Diagnosis not present

## 2021-07-06 DIAGNOSIS — Z3A36 36 weeks gestation of pregnancy: Secondary | ICD-10-CM | POA: Insufficient documentation

## 2021-07-06 DIAGNOSIS — O0990 Supervision of high risk pregnancy, unspecified, unspecified trimester: Secondary | ICD-10-CM

## 2021-07-06 DIAGNOSIS — O2243 Hemorrhoids in pregnancy, third trimester: Secondary | ICD-10-CM

## 2021-07-06 MED ORDER — HYDROCORTISONE (PERIANAL) 2.5 % EX CREA: TOPICAL_CREAM | Freq: Two times a day (BID) | CUTANEOUS | 2 refills | Status: DC

## 2021-07-06 NOTE — Progress Notes (Signed)
? ?  PRENATAL VISIT NOTE ? ?Subjective:  ?Debra Bernard is a 36 y.o. [id number] at 72w2dbeing seen today for ongoing prenatal care.  She is currently monitored for the following issues for this high-risk pregnancy and has AMA (advanced maternal age) primigravida 383+and Supervision of high risk pregnancy, antepartum on their problem list. ? ?Patient reports  continued hemorrhoidal pain, Proctofoam not working. Not constipated .  Contractions: Not present. Vag. Bleeding: None.  Movement: Present. Denies leaking of fluid.  ? ?The following portions of the patient's history were reviewed and updated as appropriate: allergies, current medications, past family history, past medical history, past social history, past surgical history and problem list.  ? ?Objective:  ? ?Vitals:  ? 07/06/21 1111  ?BP: 126/85  ?Pulse: (!) 111  ?Weight: 198 lb 1.6 oz (89.9 kg)  ? ? ?Fetal Status: Fetal Heart Rate (bpm): 155 Fundal Height: 3 cm Movement: Present  Presentation: Complete Breech (verified on bedside scan) ? ?General:  Alert, oriented and cooperative. Patient is in no acute distress.  ?Skin: Skin is warm and dry. No rash noted.   ?Cardiovascular: Normal heart rate noted  ?Respiratory: Normal respiratory effort, no problems with respiration noted  ?Abdomen: Soft, gravid, appropriate for gestational age.  Pain/Pressure: Present     ?Pelvic: Cervical exam performed in the presence of a chaperone Dilation: Closed Effacement (%): Thick Station: Ballotable. Hemorrhoids noted, no over inflammation noted.  ?Extremities: Normal range of motion.  Edema: Trace  ?Mental Status: Normal mood and affect. Normal behavior. Normal judgment and thought content.  ? ?Assessment and Plan:  ?Pregnancy: [id number] 370w2d1. Breech presentation, single or unspecified fetus ?Discussed implications of breech presentation; discussed that the fetus can spontaneously turn to cephalic presentation.  Other interventions could be external cephalic version which is  done in the hospital vs moxibustion vs planned cesarean delivery.  Also referred to the website spinningbabies.org, this can go through maneuvers that could help. Risks/benefits of all modalities discussed in detail. Information was given to her to review at home. Will recheck presentation next week and make decision about intervention, if needed ? ? ?2. Hemorrhoids during pregnancy in third trimester ?Will try Anusol in lieu of Proctofoam, monitor response ?- hydrocortisone (ANUSOL-HC) 2.5 % rectal cream; Place rectally 2 (two) times daily.  Dispense: 30 g; Refill: 2 ? ?3. [redacted] weeks gestation of pregnancy ?4. Primigravida of advanced maternal age in third trimester ?5. Supervision of high risk pregnancy, antepartum ?Pelvic cultures done, will follow up results and manage accordingly. ?- Strep Gp B NAA ?- Cervicovaginal ancillary only ? ?Preterm labor symptoms and general obstetric precautions including but not limited to vaginal bleeding, contractions, leaking of fluid and fetal movement were reviewed in detail with the patient. ?Please refer to After Visit Summary for other counseling recommendations.  ? ?Return in about 1 week (around 07/13/2021) for OFFICE OB VISIT (MD or APP). ? ?No future appointments. ? ?UgVerita SchneidersMD ? ?

## 2021-07-06 NOTE — Patient Instructions (Addendum)
Go to [email or web address], breech section for exercises that may help. ? ? ?Return to office for any scheduled appointments. Call the office or go to the MAU at [facility] if: ?You begin to have strong, frequent contractions ?Your water breaks.  Sometimes it is a big gush of fluid, sometimes it is just a trickle that keeps getting your panties wet or running down your legs ?You have vaginal bleeding.  It is normal to have a small amount of spotting if your cervix was checked.  ?You do not feel your baby moving like normal.  If you do not, get something to eat and drink and lay down and focus on feeling your baby move.   If your baby is still not moving like normal, you should call the office or go to MAU. ?Any other obstetric concerns. ? ?

## 2021-07-07 LAB — CERVICOVAGINAL ANCILLARY ONLY
Chlamydia: NEGATIVE
Comment: NEGATIVE
Comment: NORMAL
Neisseria Gonorrhea: NEGATIVE

## 2021-07-08 ENCOUNTER — Other Ambulatory Visit: Payer: Self-pay

## 2021-07-08 DIAGNOSIS — R519 Headache, unspecified: Secondary | ICD-10-CM

## 2021-07-08 LAB — STREP GP B NAA: Strep Gp B NAA: POSITIVE — AB

## 2021-07-08 NOTE — Telephone Encounter (Signed)
Received fax requesting refill on Flexeril.  Request routed to Kerry Hough, [place] for approval.   ? ?Vietta Bonifield,RN  ?07/08/21 ?

## 2021-07-08 NOTE — Telephone Encounter (Signed)
I just refilled this medication 3 days ago when the patient called.  ?

## 2021-07-11 ENCOUNTER — Encounter: Payer: Self-pay | Admitting: Obstetrics & Gynecology

## 2021-07-13 ENCOUNTER — Ambulatory Visit (INDEPENDENT_AMBULATORY_CARE_PROVIDER_SITE_OTHER): Payer: Medicaid Other | Admitting: Obstetrics and Gynecology

## 2021-07-13 ENCOUNTER — Encounter: Payer: Self-pay | Admitting: Obstetrics and Gynecology

## 2021-07-13 VITALS — BP 113/80 | HR 114 | Wt 194.2 lb

## 2021-07-13 DIAGNOSIS — O09513 Supervision of elderly primigravida, third trimester: Secondary | ICD-10-CM

## 2021-07-13 DIAGNOSIS — O0990 Supervision of high risk pregnancy, unspecified, unspecified trimester: Secondary | ICD-10-CM

## 2021-07-13 DIAGNOSIS — O99820 Streptococcus B carrier state complicating pregnancy: Secondary | ICD-10-CM

## 2021-07-13 NOTE — Progress Notes (Addendum)
? ?  PRENATAL VISIT NOTE ? ?Subjective:  ?Debra Bernard is a 36 y.o. [id number] at 56w2dbeing seen today for ongoing prenatal care.  She is currently monitored for the following issues for this low-risk pregnancy and has GBS (group B Streptococcus carrier), +RV culture, currently pregnant; AMA (advanced maternal age) primigravida 335+ and Supervision of high risk pregnancy, antepartum on their problem list. ? ?Patient reports no complaints.  Contractions: Not present. Vag. Bleeding: None.  Movement: Present. Denies leaking of fluid.  ? ?The following portions of the patient's history were reviewed and updated as appropriate: allergies, current medications, past family history, past medical history, past social history, past surgical history and problem list.  ? ?Objective:  ? ?Vitals:  ? 07/13/21 1320  ?BP: 113/80  ?Pulse: (!) 114  ?Weight: 194 lb 3.2 oz (88.1 kg)  ? ? ?Fetal Status: Fetal Heart Rate (bpm): 165 Fundal Height: 37 cm Movement: Present  Presentation: Vertex ? ?General:  Alert, oriented and cooperative. Patient is in no acute distress.  ?Skin: Skin is warm and dry. No rash noted.   ?Cardiovascular: Normal heart rate noted  ?Respiratory: Normal respiratory effort, no problems with respiration noted  ?Abdomen: Soft, gravid, appropriate for gestational age.  Pain/Pressure: Present     ?Pelvic: Cervical exam deferred        ?Extremities: Normal range of motion.  Edema: Trace  ?Mental Status: Normal mood and affect. Normal behavior. Normal judgment and thought content.  ? ?Assessment and Plan:  ?Pregnancy: [id number] 352w2d1. Supervision of high risk pregnancy, antepartum ?Patient is doing well without complaints ?Pt informed that the ultrasound is considered a limited OB ultrasound and is not intended to be a complete ultrasound exam.  Patient also informed that the ultrasound is not being completed with the intent of assessing for fetal or placental anomalies or any pelvic abnormalities.  Explained that the  purpose of today?s ultrasound is to assess for  presentation.  Patient acknowledges the purpose of the exam and the limitations of the study. Ultrasound reveals fetus in vertex presentation ? ? ?2. GBS (group B Streptococcus carrier), +RV culture, currently pregnant ?Prophylaxis in labor ? ?3. Primigravida of advanced maternal age in third trimester ? ? ?Term labor symptoms and general obstetric precautions including but not limited to vaginal bleeding, contractions, leaking of fluid and fetal movement were reviewed in detail with the patient. ?Please refer to After Visit Summary for other counseling recommendations.  ? ?Return in about 1 week (around 07/20/2021) for in person, ROB, Low risk. ? ?Future Appointments  ?Date Time Provider [place] Level?07/22/2021 10:15 AM PiAletha HalimMD [facility]? ? ?PeMora BellmanMD ? ?

## 2021-07-22 ENCOUNTER — Telehealth (HOSPITAL_COMMUNITY): Payer: Self-pay | Admitting: *Deleted

## 2021-07-22 ENCOUNTER — Ambulatory Visit (INDEPENDENT_AMBULATORY_CARE_PROVIDER_SITE_OTHER): Payer: Medicaid Other | Admitting: Obstetrics and Gynecology

## 2021-07-22 ENCOUNTER — Encounter (HOSPITAL_COMMUNITY): Payer: Self-pay | Admitting: *Deleted

## 2021-07-22 VITALS — BP 135/87 | HR 94 | Wt 196.7 lb

## 2021-07-22 DIAGNOSIS — O99820 Streptococcus B carrier state complicating pregnancy: Secondary | ICD-10-CM

## 2021-07-22 DIAGNOSIS — O0990 Supervision of high risk pregnancy, unspecified, unspecified trimester: Secondary | ICD-10-CM

## 2021-07-22 DIAGNOSIS — Z3A38 38 weeks gestation of pregnancy: Secondary | ICD-10-CM

## 2021-07-22 DIAGNOSIS — O09513 Supervision of elderly primigravida, third trimester: Secondary | ICD-10-CM

## 2021-07-22 DIAGNOSIS — O0993 Supervision of high risk pregnancy, unspecified, third trimester: Secondary | ICD-10-CM

## 2021-07-22 MED ORDER — MICONAZOLE NITRATE 2 % VA CREA: 1.0000 | TOPICAL_CREAM | Freq: Every day | VAGINAL | 2 refills | Status: DC

## 2021-07-22 NOTE — Progress Notes (Signed)
? ? ? ?  PRENATAL VISIT NOTE ? ?Subjective:  ?Debra Bernard is a 36 y.o. [id number] at 77w4dbeing seen today for ongoing prenatal care.  She is currently monitored for the following issues for this low-risk pregnancy and has GBS (group B Streptococcus carrier), +RV culture, currently pregnant; AMA (advanced maternal age) primigravida 383+ and Supervision of high risk pregnancy, antepartum on their problem list. ? ?Patient reports no complaints.  Contractions: Irritability. Vag. Bleeding: None.  Movement: Present. Denies leaking of fluid.  ? ?The following portions of the patient's history were reviewed and updated as appropriate: allergies, current medications, past family history, past medical history, past social history, past surgical history and problem list.  ? ?Objective:  ? ?Vitals:  ? 07/22/21 1025  ?BP: 135/87  ?Pulse: 94  ?Weight: 196 lb 11.2 oz (89.2 kg)  ? ? ?Fetal Status: Fetal Heart Rate (bpm): 161   Movement: Present    ? ?General:  Alert, oriented and cooperative. Patient is in no acute distress.  ?Skin: Skin is warm and dry. No rash noted.   ?Cardiovascular: Normal heart rate noted  ?Respiratory: Normal respiratory effort, no problems with respiration noted  ?Abdomen: Soft, gravid, appropriate for gestational age.  Pain/Pressure: Present     ?Pelvic: Cervical exam deferred        ?Extremities: Normal range of motion.  Edema: None  ?Mental Status: Normal mood and affect. Normal behavior. Normal judgment and thought content.  ? ?Assessment and Plan:  ?Pregnancy: [id number] 322w4d1. Primigravida of advanced maternal age in third trimester ? ?2. Supervision of high risk pregnancy, antepartum ?cephalic on bedside u/s today ?Set up for 4/23 am IOL ? ?3. GBS (group B Streptococcus carrier), +RV culture, currently pregnant ? ?Term labor symptoms and general obstetric precautions including but not limited to vaginal bleeding, contractions, leaking of fluid and fetal movement were reviewed in detail with the  patient. ?Please refer to After Visit Summary for other counseling recommendations.  ? ?Return in about 1 week (around 07/29/2021) for in person, md or app, low risk ob, nst/bpp with diane or carrie. ? ?Future Appointments  ?Date Time Provider [place]?07/28/2021 11:15 AM [facility] NST [facility]  ?07/31/2021  6:30 AM [facility] SCHED ROOM [facility] None  ? ? ?ChAletha HalimMD ? ?

## 2021-07-22 NOTE — Telephone Encounter (Signed)
Preadmission screen  

## 2021-07-22 NOTE — Progress Notes (Signed)
Pt states is having Vaginal itching with no odor, no discharge. ?

## 2021-07-22 NOTE — Patient Instructions (Signed)
You are scheduled for the day time on Sunday, April 23rd ?

## 2021-07-25 ENCOUNTER — Telehealth (HOSPITAL_COMMUNITY): Payer: Self-pay | Admitting: *Deleted

## 2021-07-25 ENCOUNTER — Encounter: Payer: Self-pay | Admitting: *Deleted

## 2021-07-25 NOTE — Telephone Encounter (Signed)
Preadmission screen  

## 2021-07-27 ENCOUNTER — Other Ambulatory Visit: Payer: Self-pay | Admitting: Advanced Practice Midwife

## 2021-07-28 ENCOUNTER — Ambulatory Visit (INDEPENDENT_AMBULATORY_CARE_PROVIDER_SITE_OTHER): Payer: Medicaid Other

## 2021-07-28 ENCOUNTER — Ambulatory Visit: Payer: Medicaid Other | Admitting: *Deleted

## 2021-07-28 VITALS — BP 127/78 | HR 106 | Wt 195.7 lb

## 2021-07-28 DIAGNOSIS — O09513 Supervision of elderly primigravida, third trimester: Secondary | ICD-10-CM

## 2021-07-28 NOTE — Progress Notes (Signed)
Pt informed that the ultrasound is considered a limited OB ultrasound and is not intended to be a complete ultrasound exam.  Patient also informed that the ultrasound is not being completed with the intent of assessing for fetal or placental anomalies or any pelvic abnormalities.  Explained that the purpose of today?s ultrasound is to assess for presentation, BPP and amniotic fluid volume.  Patient acknowledges the purpose of the exam and the limitations of the study.   ? ?IOL scheduled on 4/23.  ? ?

## 2021-07-31 ENCOUNTER — Inpatient Hospital Stay (HOSPITAL_COMMUNITY)
Admission: AD | Admit: 2021-07-31 | Discharge: 2021-08-10 | DRG: 768 | Disposition: A | Discharge status: Home / Self Care | Payer: Medicaid Other | Attending: Obstetrics & Gynecology | Admitting: Obstetrics & Gynecology

## 2021-07-31 ENCOUNTER — Inpatient Hospital Stay (HOSPITAL_COMMUNITY): Payer: Medicaid Other

## 2021-07-31 ENCOUNTER — Other Ambulatory Visit: Payer: Self-pay

## 2021-07-31 ENCOUNTER — Encounter (HOSPITAL_COMMUNITY): Payer: Self-pay | Admitting: Obstetrics and Gynecology

## 2021-07-31 DIAGNOSIS — O322XX0 Maternal care for transverse and oblique lie, not applicable or unspecified: Secondary | ICD-10-CM | POA: Diagnosis present

## 2021-07-31 DIAGNOSIS — R000 Tachycardia, unspecified: Secondary | ICD-10-CM | POA: Diagnosis present

## 2021-07-31 DIAGNOSIS — D62 Acute posthemorrhagic anemia: Secondary | ICD-10-CM | POA: Diagnosis not present

## 2021-07-31 DIAGNOSIS — R7989 Other specified abnormal findings of blood chemistry: Secondary | ICD-10-CM | POA: Diagnosis not present

## 2021-07-31 DIAGNOSIS — O2243 Hemorrhoids in pregnancy, third trimester: Secondary | ICD-10-CM

## 2021-07-31 DIAGNOSIS — R5082 Postprocedural fever: Secondary | ICD-10-CM | POA: Diagnosis not present

## 2021-07-31 DIAGNOSIS — N179 Acute kidney failure, unspecified: Secondary | ICD-10-CM | POA: Diagnosis not present

## 2021-07-31 DIAGNOSIS — R609 Edema, unspecified: Secondary | ICD-10-CM | POA: Diagnosis not present

## 2021-07-31 DIAGNOSIS — G9340 Encephalopathy, unspecified: Secondary | ICD-10-CM | POA: Diagnosis present

## 2021-07-31 DIAGNOSIS — O411230 Chorioamnionitis, third trimester, not applicable or unspecified: Secondary | ICD-10-CM | POA: Diagnosis present

## 2021-07-31 DIAGNOSIS — O99824 Streptococcus B carrier state complicating childbirth: Secondary | ICD-10-CM | POA: Diagnosis present

## 2021-07-31 DIAGNOSIS — N170 Acute kidney failure with tubular necrosis: Secondary | ICD-10-CM | POA: Diagnosis not present

## 2021-07-31 DIAGNOSIS — D696 Thrombocytopenia, unspecified: Secondary | ICD-10-CM | POA: Diagnosis present

## 2021-07-31 DIAGNOSIS — O99892 Other specified diseases and conditions complicating childbirth: Secondary | ICD-10-CM | POA: Diagnosis present

## 2021-07-31 DIAGNOSIS — O99355 Diseases of the nervous system complicating the puerperium: Secondary | ICD-10-CM | POA: Diagnosis present

## 2021-07-31 DIAGNOSIS — O9952 Diseases of the respiratory system complicating childbirth: Secondary | ICD-10-CM | POA: Diagnosis present

## 2021-07-31 DIAGNOSIS — O26893 Other specified pregnancy related conditions, third trimester: Secondary | ICD-10-CM | POA: Diagnosis present

## 2021-07-31 DIAGNOSIS — K760 Fatty (change of) liver, not elsewhere classified: Secondary | ICD-10-CM | POA: Diagnosis present

## 2021-07-31 DIAGNOSIS — R578 Other shock: Secondary | ICD-10-CM | POA: Diagnosis not present

## 2021-07-31 DIAGNOSIS — O9081 Anemia of the puerperium: Secondary | ICD-10-CM | POA: Diagnosis not present

## 2021-07-31 DIAGNOSIS — O2662 Liver and biliary tract disorders in childbirth: Secondary | ICD-10-CM | POA: Diagnosis present

## 2021-07-31 DIAGNOSIS — Z23 Encounter for immunization: Secondary | ICD-10-CM

## 2021-07-31 DIAGNOSIS — Z3A39 39 weeks gestation of pregnancy: Secondary | ICD-10-CM

## 2021-07-31 DIAGNOSIS — O99820 Streptococcus B carrier state complicating pregnancy: Secondary | ICD-10-CM | POA: Diagnosis not present

## 2021-07-31 DIAGNOSIS — D259 Leiomyoma of uterus, unspecified: Secondary | ICD-10-CM | POA: Diagnosis not present

## 2021-07-31 DIAGNOSIS — R519 Headache, unspecified: Principal | ICD-10-CM

## 2021-07-31 DIAGNOSIS — D649 Anemia, unspecified: Secondary | ICD-10-CM | POA: Diagnosis not present

## 2021-07-31 DIAGNOSIS — R600 Localized edema: Secondary | ICD-10-CM | POA: Diagnosis not present

## 2021-07-31 DIAGNOSIS — J9601 Acute respiratory failure with hypoxia: Secondary | ICD-10-CM | POA: Diagnosis not present

## 2021-07-31 DIAGNOSIS — K7200 Acute and subacute hepatic failure without coma: Secondary | ICD-10-CM | POA: Diagnosis not present

## 2021-07-31 DIAGNOSIS — O320XX0 Maternal care for unstable lie, not applicable or unspecified: Secondary | ICD-10-CM | POA: Diagnosis not present

## 2021-07-31 DIAGNOSIS — Z3A40 40 weeks gestation of pregnancy: Secondary | ICD-10-CM | POA: Diagnosis not present

## 2021-07-31 DIAGNOSIS — D65 Disseminated intravascular coagulation [defibrination syndrome]: Secondary | ICD-10-CM | POA: Diagnosis not present

## 2021-07-31 DIAGNOSIS — J9600 Acute respiratory failure, unspecified whether with hypoxia or hypercapnia: Secondary | ICD-10-CM | POA: Diagnosis not present

## 2021-07-31 DIAGNOSIS — N2889 Other specified disorders of kidney and ureter: Secondary | ICD-10-CM | POA: Diagnosis not present

## 2021-07-31 DIAGNOSIS — O1425 HELLP syndrome, complicating the puerperium: Secondary | ICD-10-CM | POA: Diagnosis not present

## 2021-07-31 DIAGNOSIS — J9602 Acute respiratory failure with hypercapnia: Secondary | ICD-10-CM | POA: Diagnosis not present

## 2021-07-31 DIAGNOSIS — Z90711 Acquired absence of uterus with remaining cervical stump: Secondary | ICD-10-CM | POA: Diagnosis not present

## 2021-07-31 DIAGNOSIS — D6959 Other secondary thrombocytopenia: Secondary | ICD-10-CM | POA: Diagnosis not present

## 2021-07-31 LAB — CBC
HCT: 34.6 % — ABNORMAL LOW (ref 36.0–46.0)
Hemoglobin: 11.6 g/dL — ABNORMAL LOW (ref 12.0–15.0)
MCH: 29.4 pg (ref 26.0–34.0)
MCHC: 33.5 g/dL (ref 30.0–36.0)
MCV: 87.8 fL (ref 80.0–100.0)
Platelets: 180 10*3/uL (ref 150–400)
RBC: 3.94 MIL/uL (ref 3.87–5.11)
RDW: 14.4 % (ref 11.5–15.5)
WBC: 5.9 10*3/uL (ref 4.0–10.5)
nRBC: 0 % (ref 0.0–0.2)

## 2021-07-31 LAB — RPR: RPR Ser Ql: NONREACTIVE

## 2021-07-31 MED ORDER — TERBUTALINE SULFATE 1 MG/ML IJ SOLN
0.2500 mg | Freq: Once | INTRAMUSCULAR | Status: DC | PRN
  Filled 2021-07-31: qty 1

## 2021-07-31 MED ORDER — FENTANYL CITRATE (PF) 100 MCG/2ML IJ SOLN
50.0000 ug | INTRAMUSCULAR | Status: AC | PRN
  Administered 2021-07-31 – 2021-08-01 (×2): 100 ug via INTRAVENOUS
  Filled 2021-07-31 (×2): qty 2

## 2021-07-31 MED ORDER — LACTATED RINGERS IV SOLN
500.0000 mL | INTRAVENOUS | Status: DC | PRN
  Administered 2021-07-31: 500 mL via INTRAVENOUS

## 2021-07-31 MED ORDER — LACTATED RINGERS IV SOLN: INTRAVENOUS | Status: DC

## 2021-07-31 MED ORDER — OXYTOCIN-SODIUM CHLORIDE 30-0.9 UT/500ML-% IV SOLN
2.5000 [IU]/h | INTRAVENOUS | Status: DC
  Administered 2021-08-01: 2.5 [IU]/h via INTRAVENOUS
  Filled 2021-07-31 (×2): qty 500

## 2021-07-31 MED ORDER — FENTANYL CITRATE (PF) 100 MCG/2ML IJ SOLN
50.0000 ug | INTRAMUSCULAR | Status: DC | PRN
  Administered 2021-07-31: 50 ug via INTRAVENOUS
  Filled 2021-07-31: qty 2

## 2021-07-31 MED ORDER — MISOPROSTOL 25 MCG QUARTER TABLET
25.0000 ug | ORAL_TABLET | ORAL | Status: DC | PRN
  Administered 2021-07-31: 25 ug via VAGINAL
  Filled 2021-07-31: qty 1

## 2021-07-31 MED ORDER — SODIUM CHLORIDE 0.9 % IV SOLN
5.0000 10*6.[IU] | Freq: Once | INTRAVENOUS | Status: AC
  Administered 2021-07-31: 5 10*6.[IU] via INTRAVENOUS
  Filled 2021-07-31: qty 5

## 2021-07-31 MED ORDER — SOD CITRATE-CITRIC ACID 500-334 MG/5ML PO SOLN
30.0000 mL | ORAL | Status: DC | PRN
  Filled 2021-07-31: qty 30

## 2021-07-31 MED ORDER — OXYTOCIN BOLUS FROM INFUSION: 333.0000 mL | Freq: Once | INTRAVENOUS | Status: DC

## 2021-07-31 MED ORDER — TERBUTALINE SULFATE 1 MG/ML IJ SOLN: 0.2500 mg | Freq: Once | INTRAMUSCULAR | Status: DC | PRN

## 2021-07-31 MED ORDER — LIDOCAINE HCL (PF) 1 % IJ SOLN: 30.0000 mL | INTRAMUSCULAR | Status: DC | PRN

## 2021-07-31 MED ORDER — ACETAMINOPHEN 325 MG PO TABS
650.0000 mg | ORAL_TABLET | ORAL | Status: DC | PRN
Start: 2021-07-31 — End: 2021-08-01
  Administered 2021-08-01: 650 mg via ORAL
  Filled 2021-07-31: qty 2

## 2021-07-31 MED ORDER — ONDANSETRON HCL 4 MG/2ML IJ SOLN
4.0000 mg | Freq: Four times a day (QID) | INTRAMUSCULAR | Status: DC | PRN
  Administered 2021-08-01: 4 mg via INTRAVENOUS
  Filled 2021-07-31 (×2): qty 2

## 2021-07-31 MED ORDER — MISOPROSTOL 50MCG HALF TABLET
50.0000 ug | ORAL_TABLET | ORAL | Status: DC | PRN
  Administered 2021-07-31 (×2): 50 ug via ORAL
  Filled 2021-07-31 (×3): qty 1

## 2021-07-31 MED ORDER — OXYTOCIN-SODIUM CHLORIDE 30-0.9 UT/500ML-% IV SOLN
1.0000 m[IU]/min | INTRAVENOUS | Status: DC
  Administered 2021-07-31: 2 m[IU]/min via INTRAVENOUS

## 2021-07-31 MED ORDER — PENICILLIN G POT IN DEXTROSE 60000 UNIT/ML IV SOLN
3.0000 10*6.[IU] | INTRAVENOUS | Status: DC
  Administered 2021-07-31 – 2021-08-01 (×5): 3 10*6.[IU] via INTRAVENOUS
  Filled 2021-07-31 (×5): qty 50

## 2021-07-31 NOTE — Progress Notes (Signed)
Labor Progress Note ?Debra Bernard is a 36 y.o. [id number] at 31w6dpresented for IOL due to unstable lie. ?S: Patient is resting comfortably. Endorses increased contraction and pressure, otherwise no concerns.  ? ?O:  ?BP 126/82   Pulse 92   Temp 98.5 ?F (36.9 ?C) (Oral)   Resp 16   Ht '5\' 5"'$  (1.651 m)   Wt 90.3 kg   LMP 10/25/2020   SpO2 100%   BMI 33.12 kg/m?  ?EFM: 160 bpm/ moderate variability /+acceleration ? ?CVE: Dilation: 1 ?Effacement (%): 50 ?Station: -3 ?Presentation:  (unable to tell) ?Exam by:: Dr. NDurel Salts HYolanda Bonine RN ? ? ?A&P: 36y.o. [id number]?#Labor:No significant change from last check after second dose of buccal Cytotec. Will try FB placement in 30 minutes per patient's preference and will give a 3rd dose of Cytotec.  ?#Pain: IV PRN ?#FWB: Cat 1 ?#GBS positive >PCN ? ? ?JoAlen BleacherMD ?Center for [facility]6:40 PM  ?

## 2021-07-31 NOTE — H&P (Signed)
OBSTETRIC ADMISSION HISTORY AND PHYSICAL ? ?Keyarah Mcroy is a 36 y.o. female [phone] with IUP at 14w6dby presenting for IOL for unstable lie. She was vertex earlier last week, but noted to have transverse fetal lie 2 days ago.  She reports +FMs, No LOF, no VB, no blurry vision, headaches or peripheral edema, and RUQ pain.  She plans on breast and bottle feeding. She request postplacental IUD for birth control. ?[facility]  ? ?Dating: By LMP --->  Estimated Date of Delivery: 08/01/21 ? ?Sono:   ?_0 , CWD, normal anatomy, anterior placenta, 1868g, 70% EFW ? ? ?Prenatal History/Complications:  ?Patient Active Problem List  ? Diagnosis Date Noted  ? AMA (advanced maternal age) primigravida 35+ 12/30/2020  ? Supervision of high risk pregnancy, antepartum 12/30/2020  ? GBS (group B Streptococcus carrier), +RV culture, currently pregnant 08/19/2013  ? ?Nursing Staff Provider  ?Office [place]  ?Language  English  Anatomy [place]  normal  ?Flu Vaccine   declined 03/10/21 Genetic/Carrier Screen  NIPS:   Low risk female ?AFP:   Not done ?Horizon:  neg  ?TDaP Vaccine  05/05/21 Hgb A1C or  ?GTT Early   5.7 ?Third trimester - normal  ?COVID Vaccine May 2022 J&J   LAB RESULTS   ?Rhogam  NA Blood Type --/--/O POS (04/23 0845)   ?Baby Feeding Plan Breast/Bottle Antibody NEG (04/23 0845)  ?Contraception Post placental IUD Rubella 1.38 (10/06 1137)  ?Circumcision NA (girl) RPR Non Reactive (01/26 0819)   ?Pediatrician  List Given HBsAg Negative (10/06 1137)   ?Support Person issatou (pt aunt) HCVAb  <0.1  ?Prenatal Classes  HIV Non Reactive (01/26 0819)     ?BTL Consent NA GBS Positive/-- (03/29 1128)  ?VBAC Consent NA Pap NILM 10/22  ?     ?DME Rx [Valu.Nieves] BP cuff ?_1  Weight Scale Waterbirth  _2  Class _3  Consent _4  CNM visit  ?PHQ9 & GAD7 [  ] new OB ?_5  28 weeks  ?[  ] 36 weeks Induction  _6  Orders Entered _7 Foley Y/N  ? ? ?Past Medical History: ?Past Medical History:  ?Diagnosis Date   ? Medical history non-contributory   ? ? ?Past Surgical History: ?Past Surgical History:  ?Procedure Laterality Date  ? NO PAST SURGERIES    ? ? ?Obstetrical History: ?OB History   ? ? Gravida  ?4  ? Para  ?2  ? Term  ?2  ? Preterm  ?0  ? AB  ?1  ? Living  ?2  ?  ? ? SAB  ?1  ? IAB  ?0  ? Ectopic  ?0  ? Multiple  ?0  ? Live Births  ?2  ?   ?  ?  ? ? ?Social History ?Social History  ? ?Socioeconomic History  ? Marital status: Married  ?  Spouse name: Not on file  ? Number of children: Not on file  ? Years of education: Not on file  ? Highest education level: Not on file  ?Occupational History  ? Not on file  ?Tobacco Use  ? Smoking status: Never  ? Smokeless tobacco: Never  ?Vaping Use  ? Vaping Use: Never used  ?Substance and Sexual Activity  ? Alcohol use: No  ? Drug use: No  ? Sexual activity: Yes  ?Other Topics Concern  ? Not on file  ?Social History Narrative  ? Not on file  ? ?Social  Determinants of Health  ? ?Financial Resource Strain: Not on file  ?Food Insecurity: No Food Insecurity  ? Worried About Charity fundraiser in the Last Year: Never true  ? Ran Out of Food in the Last Year: Never true  ?Transportation Needs: No Transportation Needs  ? Lack of Transportation (Medical): No  ? Lack of Transportation (Non-Medical): No  ?Physical Activity: Not on file  ?Stress: Not on file  ?Social Connections: Not on file  ? ? ?Family History: ?Family History  ?Problem Relation Age of Onset  ? Varicose Veins Father   ? Alcohol abuse Neg Hx   ? Arthritis Neg Hx   ? Asthma Neg Hx   ? Birth defects Neg Hx   ? Cancer Neg Hx   ? COPD Neg Hx   ? Depression Neg Hx   ? Diabetes Neg Hx   ? Drug abuse Neg Hx   ? Early death Neg Hx   ? Hearing loss Neg Hx   ? Hyperlipidemia Neg Hx   ? Heart disease Neg Hx   ? Hypertension Neg Hx   ? Kidney disease Neg Hx   ? Learning disabilities Neg Hx   ? Mental illness Neg Hx   ? Mental retardation Neg Hx   ? Miscarriages / Stillbirths Neg Hx   ? Stroke Neg Hx   ? Vision loss Neg Hx    ? ? ?Allergies: ?No Known Allergies ? ?Medications Prior to Admission  ?Medication Sig Dispense Refill Last Dose  ? Prenatal Vit-Fe Fumarate-FA (PREPLUS) 27-1 MG TABS Take 1 tablet by mouth daily. 30 tablet 13 07/30/2021  ? Blood Pressure Monitoring (BLOOD PRESSURE KIT) DEVI 1 Device by Does not apply route as needed. 1 each 0   ? cyclobenzaprine (FLEXERIL) 5 MG tablet Take 1 tablet (5 mg total) by mouth 3 (three) times daily as needed for muscle spasms. 30 tablet 0   ? Doxylamine-Pyridoxine (DICLEGIS) 10-10 MG TBEC Take 1 tablet by mouth in the morning, at noon, and at bedtime. 60 tablet 2   ? famotidine (PEPCID) 40 MG tablet Take 1 tablet (40 mg total) by mouth daily. 60 tablet 5   ? hydrocortisone (ANUSOL-HC) 2.5 % rectal cream Place rectally 2 (two) times daily. 30 g 2   ? miconazole (MONISTAT 7) 2 % vaginal cream Place 1 Applicatorful vaginally at bedtime. Apply for seven nights 30 g 2   ? polyethylene glycol powder (GLYCOLAX/MIRALAX) 17 GM/SCOOP powder Take 17 g by mouth daily as needed. 510 g 1   ? ? ? ?Review of Systems  ? ?All systems reviewed and negative except as stated in HPI ? ?Blood pressure 127/87, pulse (!) 102, temperature 98.5 ?F (36.9 ?C), temperature source Oral, resp. rate 14, height _0  (1.651 m), weight 90.3 kg, last menstrual period 10/25/2020, SpO2 100 %, currently breastfeeding. ?General appearance: alert and no distress ?Lungs: clear to auscultation bilaterally ?Heart: regular rate and rhythm ?Abdomen: soft, non-tender; bowel sounds normal ?Extremities: Homans sign is negative, no sign of DVT ?Presentation: cephalic (verified on ultrasound) ?Fetal monitoring Baseline: 150 bpm, Variability: Good {> 6 bpm), Accelerations: Reactive, and Decelerations: Absent ?Uterine activity q6-8 mins ?Dilation: Closed ?Effacement (%): Thick ?Station: -3 ?Exam by:: Dr. Higinio Plan ? ? ?Results for orders placed or performed during the hospital encounter of 07/31/21 (from the past 24 hour(s))  ?CBC  ? Collection  Time: 07/31/21  8:45 AM  ?Result Value Ref Range  ? WBC 5.9 4.0 - 10.5 K/uL  ? RBC 3.94  3.87 - 5.11 MIL/uL  ? Hemoglobin 11.6 (L) 12.0 - 15.0 g/dL  ? HCT 34.6 (L) 36.0 - 46.0 %  ? MCV 87.8 80.0 - 100.0 fL  ? MCH 29.4 26.0 - 34.0 pg  ? MCHC 33.5 30.0 - 36.0 g/dL  ? RDW 14.4 11.5 - 15.5 %  ? Platelets 180 150 - 400 K/uL  ? nRBC 0.0 0.0 - 0.2 %  ?Type and screen  ? Collection Time: 07/31/21  8:45 AM  ?Result Value Ref Range  ? ABO/RH(D) O POS   ? Antibody Screen NEG   ? Sample Expiration    ?  08/03/2021,2359 ?Performed at [facility], [place] [address] [place], [place] [address] ?  ? ? ?Patient Active Problem List  ? Diagnosis Date Noted  ? AMA (advanced maternal age) primigravida 35+ 12/30/2020  ? Supervision of high risk pregnancy, antepartum 12/30/2020  ? GBS (group B Streptococcus carrier), +RV culture, currently pregnant 08/19/2013  ? ? ?Assessment/Plan:  ?Chantae Soo is a 36 y.o. [id number] at 30w6dhere for IOL ? ?#Labor:Will start with misoprostol, then foley bulb for cervical ripening. Will keep an eye on fetal presentation. ?#Pain: IV meds or epidural as needed ?#FWB: Category I ?#ID:  GBS pos > PCN ?#MOF: Breast/Bottle ?#MOC: Postplacental IUD ?#MOD  Hopeful for vaginal delivery ? ?UVerita Schneiders MD  ?07/31/2021, 12:06 PM ? ?  ?

## 2021-07-31 NOTE — Progress Notes (Signed)
Labor Progress Note ?Paisly Fingerhut is a 36 y.o. Q6S3419 at 56w6dpresented for IOL for unstable lie. ?S: Patient is resting comfortably. Endorses contraction but no complain at this time. ? ?O:  ?BP 127/87 (BP Location: Left Arm)   Pulse (!) 102   Temp 98.5 ?F (36.9 ?C) (Oral)   Resp 14   Ht '5\' 5"'$  (1.651 m)   Wt 90.3 kg   LMP 10/25/2020   SpO2 100%   BMI 33.12 kg/m?  ?EFM: 160 bpm/ moderate variability /+acceleration ? ?CVE: Dilation: 1 ?Effacement (%): 50 ?Station: -3 ?Presentation: Vertex (BS [place] ?Exam by:: MArizona Constable RN ? ? ?A&P: 36y.o. [id number]?#Labor: Progressing well. She is about 1cm dilated and USKoreahowed baby is vertex position. Will give second dose of Cytotec and continue induction management. Plan to reassess in 3-4 hours.  ?#Pain: IV PRN, epidural at patient's request  ?#FWB: Cat 1 ?#GBS positive  > PCN ? ? ?JoAlen BleacherMD ?Center for [facility]1:19 PM  ?

## 2021-07-31 NOTE — Progress Notes (Signed)
Interim Progress Note:  ? ?After verbal consent, placed FB with 40cc (patient request to hold off more). Will give fentanyl 36mg now and if tolerating well, add additional 20 cc in 30 minutes.  ? ?SPatriciaann Clan DO  ?

## 2021-07-31 NOTE — Progress Notes (Addendum)
Labor Progress Note ?An Lannan is a 36 y.o. [id number] at 37w6dpresented for IOL at term and known unstable lie. ? ?S: Patient is resting comfortably. No concerns. ? ?O:  ?BP 118/69   Pulse 83   Temp 97.6 ?F (36.4 ?C) (Oral)   Resp 17   Ht '5\' 5"'$  (1.651 m)   Wt 90.3 kg   LMP 10/25/2020   SpO2 100%   BMI 33.12 kg/m?  ?EFM: 150 bpm/minimal variability/+accels ? ?CVE: Dilation: 3.5 ?Effacement (%): 60 ?Station: -3 ?Presentation: Vertex ?Exam by:: ZBenay Pillow RN ? ? ?A&P: 36y.o. [id number] for IOL ?#Labor: Progressing well. FB has fallen out. Will start patient on Pitocin  ?#Pain: IV PRN ?#FWB: Cat 1 ?#GBS positive > PCN ? ? ?ChArmanda MagicMedical Student ?11:43 PM ? ? ?GME ATTESTATION:  ?I saw and evaluated the patient. I agree with the findings and the plan of care as documented in the student's note and have made all necessary edits. Patient now s/p FB and cytotec. Cervix 3.5. Confirmed vertex on BSUS. Ballotable and therefore will start pitocin and then reassess in 2-3 hours for AROM. Patient expressed understanding and is in agreement with plan.  ? ?AnRenard MatterMD, MPH ?OB Fellow, Faculty Practice ?CoCarol Streamor [facility] ?08/01/2021 12:25 AM ? ?

## 2021-08-01 ENCOUNTER — Inpatient Hospital Stay (HOSPITAL_COMMUNITY): Payer: Medicaid Other

## 2021-08-01 ENCOUNTER — Other Ambulatory Visit: Payer: Self-pay

## 2021-08-01 ENCOUNTER — Encounter (HOSPITAL_COMMUNITY): Payer: Self-pay | Admitting: Obstetrics and Gynecology

## 2021-08-01 ENCOUNTER — Inpatient Hospital Stay (HOSPITAL_COMMUNITY): Payer: Medicaid Other | Admitting: Anesthesiology

## 2021-08-01 ENCOUNTER — Encounter (HOSPITAL_COMMUNITY)
Admission: AD | Disposition: A | Discharge status: Home / Self Care | Payer: Self-pay | Source: Home / Self Care | Attending: Obstetrics and Gynecology

## 2021-08-01 DIAGNOSIS — D62 Acute posthemorrhagic anemia: Secondary | ICD-10-CM

## 2021-08-01 DIAGNOSIS — J9601 Acute respiratory failure with hypoxia: Secondary | ICD-10-CM

## 2021-08-01 DIAGNOSIS — R578 Other shock: Secondary | ICD-10-CM

## 2021-08-01 DIAGNOSIS — N179 Acute kidney failure, unspecified: Secondary | ICD-10-CM

## 2021-08-01 DIAGNOSIS — J9602 Acute respiratory failure with hypercapnia: Secondary | ICD-10-CM | POA: Diagnosis not present

## 2021-08-01 DIAGNOSIS — O1425 HELLP syndrome, complicating the puerperium: Secondary | ICD-10-CM

## 2021-08-01 DIAGNOSIS — D649 Anemia, unspecified: Secondary | ICD-10-CM

## 2021-08-01 DIAGNOSIS — D65 Disseminated intravascular coagulation [defibrination syndrome]: Secondary | ICD-10-CM

## 2021-08-01 DIAGNOSIS — R7989 Other specified abnormal findings of blood chemistry: Secondary | ICD-10-CM

## 2021-08-01 DIAGNOSIS — O99820 Streptococcus B carrier state complicating pregnancy: Secondary | ICD-10-CM

## 2021-08-01 DIAGNOSIS — O320XX0 Maternal care for unstable lie, not applicable or unspecified: Secondary | ICD-10-CM

## 2021-08-01 DIAGNOSIS — N2889 Other specified disorders of kidney and ureter: Secondary | ICD-10-CM

## 2021-08-01 DIAGNOSIS — Z3A40 40 weeks gestation of pregnancy: Secondary | ICD-10-CM

## 2021-08-01 DIAGNOSIS — D259 Leiomyoma of uterus, unspecified: Secondary | ICD-10-CM

## 2021-08-01 HISTORY — PX: CYSTOSCOPY: SHX5120

## 2021-08-01 HISTORY — PX: IR EMBO ART  VEN HEMORR LYMPH EXTRAV  INC GUIDE ROADMAPPING: IMG5450

## 2021-08-01 HISTORY — PX: RADIOLOGY WITH ANESTHESIA: SHX6223

## 2021-08-01 HISTORY — PX: IR ANGIOGRAM SELECTIVE EACH ADDITIONAL VESSEL: IMG667

## 2021-08-01 HISTORY — PX: ABDOMINAL HYSTERECTOMY: SHX81

## 2021-08-01 HISTORY — PX: DILATION AND CURETTAGE OF UTERUS: SHX78

## 2021-08-01 HISTORY — PX: IR US GUIDE VASC ACCESS RIGHT: IMG2390

## 2021-08-01 LAB — DIC (DISSEMINATED INTRAVASCULAR COAGULATION)PANEL
D-Dimer, Quant: >20 ug/mL-FEU — ABNORMAL HIGH (ref 0.00–0.50)
D-Dimer, Quant: >20 ug/mL-FEU — ABNORMAL HIGH (ref 0.00–0.50)
D-Dimer, Quant: >20 ug/mL-FEU — ABNORMAL HIGH (ref 0.00–0.50)
Fibrinogen: <60 mg/dL — CL (ref 210–475)
Fibrinogen: 140 mg/dL — ABNORMAL LOW (ref 210–475)
Fibrinogen: 214 mg/dL (ref 210–475)
INR: >10 (ref 0.8–1.2)
INR: 2.2 — ABNORMAL HIGH (ref 0.8–1.2)
INR: 1.6 — ABNORMAL HIGH (ref 0.8–1.2)
Platelets: 156 10*3/uL (ref 150–400)
Platelets: 87 10*3/uL — ABNORMAL LOW (ref 150–400)
Platelets: 119 10*3/uL — ABNORMAL LOW (ref 150–400)
Prothrombin Time: >90 seconds — ABNORMAL HIGH (ref 11.4–15.2)
Prothrombin Time: 24.1 seconds — ABNORMAL HIGH (ref 11.4–15.2)
Prothrombin Time: 18.8 seconds — ABNORMAL HIGH (ref 11.4–15.2)
Smear Review: NONE SEEN
Smear Review: NONE SEEN
Smear Review: NONE SEEN
aPTT: >200 seconds (ref 24–36)
aPTT: 96 seconds — ABNORMAL HIGH (ref 24–36)
aPTT: 55 seconds — ABNORMAL HIGH (ref 24–36)

## 2021-08-01 LAB — CBC
HCT: 27.1 % — ABNORMAL LOW (ref 36.0–46.0)
HCT: 27 % — ABNORMAL LOW (ref 36.0–46.0)
HCT: 27.4 % — ABNORMAL LOW (ref 36.0–46.0)
Hemoglobin: 9 g/dL — ABNORMAL LOW (ref 12.0–15.0)
Hemoglobin: 8.4 g/dL — ABNORMAL LOW (ref 12.0–15.0)
Hemoglobin: 9.8 g/dL — ABNORMAL LOW (ref 12.0–15.0)
MCH: 29.5 pg (ref 26.0–34.0)
MCH: 28.7 pg (ref 26.0–34.0)
MCH: 30.8 pg (ref 26.0–34.0)
MCHC: 33.2 g/dL (ref 30.0–36.0)
MCHC: 31.1 g/dL (ref 30.0–36.0)
MCHC: 35.8 g/dL (ref 30.0–36.0)
MCV: 88.9 fL (ref 80.0–100.0)
MCV: 92.2 fL (ref 80.0–100.0)
MCV: 86.2 fL (ref 80.0–100.0)
Platelets: 158 10*3/uL (ref 150–400)
Platelets: 93 10*3/uL — ABNORMAL LOW (ref 150–400)
Platelets: 108 10*3/uL — ABNORMAL LOW (ref 150–400)
RBC: 3.05 MIL/uL — ABNORMAL LOW (ref 3.87–5.11)
RBC: 2.93 MIL/uL — ABNORMAL LOW (ref 3.87–5.11)
RBC: 3.18 MIL/uL — ABNORMAL LOW (ref 3.87–5.11)
RDW: 14.8 % (ref 11.5–15.5)
RDW: 14.6 % (ref 11.5–15.5)
RDW: 14.6 % (ref 11.5–15.5)
WBC: 16.6 10*3/uL — ABNORMAL HIGH (ref 4.0–10.5)
WBC: 14.9 10*3/uL — ABNORMAL HIGH (ref 4.0–10.5)
WBC: 9.2 10*3/uL (ref 4.0–10.5)
nRBC: 0.7 % — ABNORMAL HIGH (ref 0.0–0.2)
nRBC: 2.4 % — ABNORMAL HIGH (ref 0.0–0.2)
nRBC: 1.9 % — ABNORMAL HIGH (ref 0.0–0.2)

## 2021-08-01 LAB — GLUCOSE, CAPILLARY
Glucose-Capillary: 49 mg/dL — ABNORMAL LOW (ref 70–99)
Glucose-Capillary: 57 mg/dL — ABNORMAL LOW (ref 70–99)
Glucose-Capillary: 76 mg/dL (ref 70–99)
Glucose-Capillary: 97 mg/dL (ref 70–99)

## 2021-08-01 LAB — CBC WITH DIFFERENTIAL/PLATELET
Abs Immature Granulocytes: 0.09 10*3/uL — ABNORMAL HIGH (ref 0.00–0.07)
Basophils Absolute: 0 10*3/uL (ref 0.0–0.1)
Basophils Relative: 0 %
Eosinophils Absolute: 0 10*3/uL (ref 0.0–0.5)
Eosinophils Relative: 0 %
HCT: 25.6 % — ABNORMAL LOW (ref 36.0–46.0)
Hemoglobin: 9.6 g/dL — ABNORMAL LOW (ref 12.0–15.0)
Immature Granulocytes: 1 %
Lymphocytes Relative: 9 %
Lymphs Abs: 1.1 10*3/uL (ref 0.7–4.0)
MCH: 30.7 pg (ref 26.0–34.0)
MCHC: 37.5 g/dL — ABNORMAL HIGH (ref 30.0–36.0)
MCV: 81.8 fL (ref 80.0–100.0)
Monocytes Absolute: 0.8 10*3/uL (ref 0.1–1.0)
Monocytes Relative: 7 %
Neutro Abs: 10.4 10*3/uL — ABNORMAL HIGH (ref 1.7–7.7)
Neutrophils Relative %: 83 %
Platelets: 103 10*3/uL — ABNORMAL LOW (ref 150–400)
RBC: 3.13 MIL/uL — ABNORMAL LOW (ref 3.87–5.11)
RDW: 14.7 % (ref 11.5–15.5)
Smear Review: NORMAL
WBC: 12.5 10*3/uL — ABNORMAL HIGH (ref 4.0–10.5)
nRBC: 2.3 % — ABNORMAL HIGH (ref 0.0–0.2)

## 2021-08-01 LAB — POCT I-STAT 7, (LYTES, BLD GAS, ICA,H+H)
Acid-Base Excess: 2 mmol/L (ref 0.0–2.0)
Acid-base deficit: 1 mmol/L (ref 0.0–2.0)
Bicarbonate: 23.8 mmol/L (ref 20.0–28.0)
Bicarbonate: 25.8 mmol/L (ref 20.0–28.0)
Calcium, Ion: 1.05 mmol/L — ABNORMAL LOW (ref 1.15–1.40)
Calcium, Ion: 1.02 mmol/L — ABNORMAL LOW (ref 1.15–1.40)
HCT: 27 % — ABNORMAL LOW (ref 36.0–46.0)
HCT: 22 % — ABNORMAL LOW (ref 36.0–46.0)
Hemoglobin: 9.2 g/dL — ABNORMAL LOW (ref 12.0–15.0)
Hemoglobin: 7.5 g/dL — ABNORMAL LOW (ref 12.0–15.0)
O2 Saturation: 99 %
O2 Saturation: 100 %
Patient temperature: 98.4
Potassium: 3.5 mmol/L (ref 3.5–5.1)
Potassium: 3.1 mmol/L — ABNORMAL LOW (ref 3.5–5.1)
Sodium: 143 mmol/L (ref 135–145)
Sodium: 143 mmol/L (ref 135–145)
TCO2: 25 mmol/L (ref 22–32)
TCO2: 27 mmol/L (ref 22–32)
pCO2 arterial: 38.2 mmHg (ref 32–48)
pCO2 arterial: 36.7 mmHg (ref 32–48)
pH, Arterial: 7.403 (ref 7.35–7.45)
pH, Arterial: 7.454 — ABNORMAL HIGH (ref 7.35–7.45)
pO2, Arterial: 126 mmHg — ABNORMAL HIGH (ref 83–108)
pO2, Arterial: 355 mmHg — ABNORMAL HIGH (ref 83–108)

## 2021-08-01 LAB — GLOBAL TEG PANEL
CFF Max Amplitude: 15.6 mm (ref 15–32)
CK with Heparinase (R): 6 min (ref 4.3–8.3)
Citrated Functional Fibrinogen: 284.7 mg/dL (ref 278–581)
Citrated Kaolin (K): 2 min (ref 0.8–2.1)
Citrated Kaolin (MA): 56.5 mm (ref 52–69)
Citrated Kaolin (R): 6.3 min (ref 4.6–9.1)
Citrated Kaolin Angle: 67.6 deg (ref 63–78)
Citrated Rapid TEG (MA): 54.2 mm (ref 52–70)

## 2021-08-01 LAB — POCT I-STAT EG7
Acid-base deficit: 17 mmol/L — ABNORMAL HIGH (ref 0.0–2.0)
Bicarbonate: 12.7 mmol/L — ABNORMAL LOW (ref 20.0–28.0)
Calcium, Ion: 0.66 mmol/L — CL (ref 1.15–1.40)
HCT: 23 % — ABNORMAL LOW (ref 36.0–46.0)
Hemoglobin: 7.8 g/dL — ABNORMAL LOW (ref 12.0–15.0)
O2 Saturation: 75 %
Patient temperature: 36.9
Potassium: 5.7 mmol/L — ABNORMAL HIGH (ref 3.5–5.1)
Sodium: 136 mmol/L (ref 135–145)
TCO2: 14 mmol/L — ABNORMAL LOW (ref 22–32)
pCO2, Ven: 46.7 mmHg (ref 44–60)
pH, Ven: 7.041 — CL (ref 7.25–7.43)
pO2, Ven: 57 mmHg — ABNORMAL HIGH (ref 32–45)

## 2021-08-01 LAB — COMPREHENSIVE METABOLIC PANEL
ALT: 66 U/L — ABNORMAL HIGH (ref 0–44)
ALT: 80 U/L — ABNORMAL HIGH (ref 0–44)
AST: 86 U/L — ABNORMAL HIGH (ref 15–41)
AST: 100 U/L — ABNORMAL HIGH (ref 15–41)
Albumin: <1.5 g/dL — ABNORMAL LOW (ref 3.5–5.0)
Albumin: 2.1 g/dL — ABNORMAL LOW (ref 3.5–5.0)
Alkaline Phosphatase: 77 U/L (ref 38–126)
Alkaline Phosphatase: 48 U/L (ref 38–126)
Anion gap: 18 — ABNORMAL HIGH (ref 5–15)
Anion gap: 19 — ABNORMAL HIGH (ref 5–15)
BUN: 9 mg/dL (ref 6–20)
BUN: 10 mg/dL (ref 6–20)
CO2: 11 mmol/L — ABNORMAL LOW (ref 22–32)
CO2: 18 mmol/L — ABNORMAL LOW (ref 22–32)
Calcium: 7.5 mg/dL — ABNORMAL LOW (ref 8.9–10.3)
Calcium: 9.1 mg/dL (ref 8.9–10.3)
Chloride: 108 mmol/L (ref 98–111)
Chloride: 105 mmol/L (ref 98–111)
Creatinine, Ser: 1.52 mg/dL — ABNORMAL HIGH (ref 0.44–1.00)
Creatinine, Ser: 1.54 mg/dL — ABNORMAL HIGH (ref 0.44–1.00)
GFR, Estimated: 46 mL/min — ABNORMAL LOW (ref >60)
GFR, Estimated: 45 mL/min — ABNORMAL LOW (ref >60)
Glucose, Bld: 278 mg/dL — ABNORMAL HIGH (ref 70–99)
Glucose, Bld: 129 mg/dL — ABNORMAL HIGH (ref 70–99)
Potassium: 5.9 mmol/L — ABNORMAL HIGH (ref 3.5–5.1)
Potassium: 4.1 mmol/L (ref 3.5–5.1)
Sodium: 137 mmol/L (ref 135–145)
Sodium: 142 mmol/L (ref 135–145)
Total Bilirubin: 0.8 mg/dL (ref 0.3–1.2)
Total Bilirubin: 2.1 mg/dL — ABNORMAL HIGH (ref 0.3–1.2)
Total Protein: 3.1 g/dL — ABNORMAL LOW (ref 6.5–8.1)
Total Protein: 3.6 g/dL — ABNORMAL LOW (ref 6.5–8.1)

## 2021-08-01 LAB — PROCALCITONIN: Procalcitonin: 0.58 ng/mL

## 2021-08-01 LAB — MASSIVE TRANSFUSION PROTOCOL ORDER (BLOOD BANK NOTIFICATION)

## 2021-08-01 LAB — BRAIN NATRIURETIC PEPTIDE: B Natriuretic Peptide: 209.6 pg/mL — ABNORMAL HIGH (ref 0.0–100.0)

## 2021-08-01 LAB — LACTIC ACID, PLASMA
Lactic Acid, Venous: 8.7 mmol/L (ref 0.5–1.9)
Lactic Acid, Venous: 4.1 mmol/L (ref 0.5–1.9)
Lactic Acid, Venous: 2.6 mmol/L (ref 0.5–1.9)

## 2021-08-01 LAB — PREPARE RBC (CROSSMATCH)

## 2021-08-01 LAB — MRSA NEXT GEN BY PCR, NASAL: MRSA by PCR Next Gen: NOT DETECTED

## 2021-08-01 SURGERY — DILATION AND CURETTAGE
Anesthesia: Epidural

## 2021-08-01 SURGERY — IR WITH ANESTHESIA
Anesthesia: General

## 2021-08-01 MED ORDER — DEXAMETHASONE SODIUM PHOSPHATE 10 MG/ML IJ SOLN
INTRAMUSCULAR | Status: DC | PRN
  Administered 2021-08-01: 5 mg via INTRAVENOUS

## 2021-08-01 MED ORDER — DIPHENHYDRAMINE HCL 50 MG/ML IJ SOLN: 12.5000 mg | INTRAMUSCULAR | Status: DC | PRN

## 2021-08-01 MED ORDER — FENTANYL CITRATE (PF) 250 MCG/5ML IJ SOLN
INTRAMUSCULAR | Status: AC
  Filled 2021-08-01: qty 5

## 2021-08-01 MED ORDER — SENNOSIDES-DOCUSATE SODIUM 8.6-50 MG PO TABS: 2.0000 | ORAL_TABLET | Freq: Every day | ORAL | Status: DC

## 2021-08-01 MED ORDER — MIDAZOLAM HCL 5 MG/5ML IJ SOLN
INTRAMUSCULAR | Status: DC | PRN
  Administered 2021-08-01 (×2): 1 mg via INTRAVENOUS

## 2021-08-01 MED ORDER — PHENYLEPHRINE HCL (PRESSORS) 10 MG/ML IV SOLN
INTRAVENOUS | Status: DC | PRN
  Administered 2021-08-01: 160 ug via INTRAVENOUS
  Administered 2021-08-01: 60 ug via INTRAVENOUS
  Administered 2021-08-01 (×2): 160 ug via INTRAVENOUS
  Administered 2021-08-01: 320 ug via INTRAVENOUS

## 2021-08-01 MED ORDER — LACTATED RINGERS IV SOLN: INTRAVENOUS | Status: DC

## 2021-08-01 MED ORDER — GELATIN ABSORBABLE 12-7 MM EX MISC
CUTANEOUS | Status: AC | PRN
  Administered 2021-08-01: 1

## 2021-08-01 MED ORDER — LACTATED RINGERS IV SOLN: INTRAVENOUS | Status: DC | PRN

## 2021-08-01 MED ORDER — CALCIUM GLUCONATE-NACL 1-0.675 GM/50ML-% IV SOLN
INTRAVENOUS | Status: AC
  Filled 2021-08-01: qty 50

## 2021-08-01 MED ORDER — ETOMIDATE 2 MG/ML IV SOLN
INTRAVENOUS | Status: AC
  Filled 2021-08-01: qty 10

## 2021-08-01 MED ORDER — DEXTROSE 50 % IV SOLN
INTRAVENOUS | Status: AC
  Filled 2021-08-01: qty 50

## 2021-08-01 MED ORDER — ROCURONIUM BROMIDE 10 MG/ML (PF) SYRINGE
PREFILLED_SYRINGE | INTRAVENOUS | Status: AC
  Filled 2021-08-01: qty 30

## 2021-08-01 MED ORDER — ROCURONIUM BROMIDE 100 MG/10ML IV SOLN
INTRAVENOUS | Status: DC | PRN
Start: 2021-08-01 — End: 2021-08-01
  Administered 2021-08-01: 50 mg via INTRAVENOUS

## 2021-08-01 MED ORDER — SODIUM CHLORIDE 0.9% IV SOLUTION: Freq: Once | INTRAVENOUS | Status: DC

## 2021-08-01 MED ORDER — PHENYLEPHRINE HCL-NACL 20-0.9 MG/250ML-% IV SOLN
INTRAVENOUS | Status: DC | PRN
  Administered 2021-08-01: 60 ug/min via INTRAVENOUS

## 2021-08-01 MED ORDER — FENTANYL-BUPIVACAINE-NACL 0.5-0.125-0.9 MG/250ML-% EP SOLN
EPIDURAL | Status: DC | PRN
  Administered 2021-08-01: 12 mL/h via EPIDURAL

## 2021-08-01 MED ORDER — MIDAZOLAM HCL 2 MG/2ML IJ SOLN
INTRAMUSCULAR | Status: AC
Start: 2021-08-01 — End: ?
  Filled 2021-08-01: qty 2

## 2021-08-01 MED ORDER — SODIUM BICARBONATE 8.4 % IV SOLN
INTRAVENOUS | Status: DC | PRN
  Administered 2021-08-01: 5 mL via EPIDURAL

## 2021-08-01 MED ORDER — ONDANSETRON HCL 4 MG/2ML IJ SOLN
INTRAMUSCULAR | Status: DC | PRN
  Administered 2021-08-01: 4 mg via INTRAVENOUS

## 2021-08-01 MED ORDER — TETANUS-DIPHTH-ACELL PERTUSSIS 5-2.5-18.5 LF-MCG/0.5 IM SUSY
0.5000 mL | PREFILLED_SYRINGE | Freq: Once | INTRAMUSCULAR | Status: DC
  Filled 2021-08-01: qty 0.5

## 2021-08-01 MED ORDER — FENTANYL CITRATE (PF) 100 MCG/2ML IJ SOLN
50.0000 ug | INTRAMUSCULAR | Status: DC | PRN
  Administered 2021-08-02: 50 ug via INTRAVENOUS

## 2021-08-01 MED ORDER — DIBUCAINE (PERIANAL) 1 % EX OINT
1.0000 "application " | TOPICAL_OINTMENT | CUTANEOUS | Status: DC | PRN
  Administered 2021-08-08: 1 via RECTAL
  Filled 2021-08-01 (×2): qty 28

## 2021-08-01 MED ORDER — ONDANSETRON HCL 4 MG PO TABS: 4.0000 mg | ORAL_TABLET | ORAL | Status: DC | PRN

## 2021-08-01 MED ORDER — INDIGOTINDISULFONATE SODIUM 8 MG/ML IJ SOLN
5.0000 mL | INTRAMUSCULAR | Status: DC
  Filled 2021-08-01: qty 5

## 2021-08-01 MED ORDER — METHYLERGONOVINE MALEATE 0.2 MG/ML IJ SOLN
INTRAMUSCULAR | Status: AC
  Administered 2021-08-01: 0.2 mg
  Filled 2021-08-01: qty 1

## 2021-08-01 MED ORDER — PHENYLEPHRINE HCL-NACL 20-0.9 MG/250ML-% IV SOLN
0.0000 ug/min | INTRAVENOUS | Status: DC
  Administered 2021-08-02: 150 ug/min via INTRAVENOUS
  Administered 2021-08-02: 75 ug/min via INTRAVENOUS
  Administered 2021-08-02: 125 ug/min via INTRAVENOUS
  Filled 2021-08-01 (×3): qty 250

## 2021-08-01 MED ORDER — PROPOFOL 500 MG/50ML IV EMUL
INTRAVENOUS | Status: DC | PRN
  Administered 2021-08-01: 60 ug/kg/min via INTRAVENOUS

## 2021-08-01 MED ORDER — TRANEXAMIC ACID-NACL 1000-0.7 MG/100ML-% IV SOLN
1000.0000 mg | INTRAVENOUS | Status: DC
Start: 2021-08-01 — End: 2021-08-02

## 2021-08-01 MED ORDER — CHLORHEXIDINE GLUCONATE CLOTH 2 % EX PADS
6.0000 | MEDICATED_PAD | Freq: Every day | CUTANEOUS | Status: DC
  Administered 2021-08-01 – 2021-08-06 (×4): 6 via TOPICAL

## 2021-08-01 MED ORDER — ONDANSETRON HCL 4 MG/2ML IJ SOLN
4.0000 mg | INTRAMUSCULAR | Status: DC | PRN
Start: 2021-08-01 — End: 2021-08-02

## 2021-08-01 MED ORDER — INDIGOTINDISULFONATE SODIUM 8 MG/ML IJ SOLN
INTRAMUSCULAR | Status: DC | PRN
  Administered 2021-08-01: 3 mL via INTRAVENOUS

## 2021-08-01 MED ORDER — SODIUM CHLORIDE 0.9 % IV SOLN: 250.0000 mL | INTRAVENOUS | Status: DC

## 2021-08-01 MED ORDER — EPHEDRINE 5 MG/ML INJ: 10.0000 mg | INTRAVENOUS | Status: DC | PRN

## 2021-08-01 MED ORDER — DOCUSATE SODIUM 50 MG/5ML PO LIQD: 100.0000 mg | Freq: Two times a day (BID) | ORAL | Status: DC

## 2021-08-01 MED ORDER — PHENYLEPHRINE HCL-NACL 20-0.9 MG/250ML-% IV SOLN
INTRAVENOUS | Status: AC
  Filled 2021-08-01: qty 500

## 2021-08-01 MED ORDER — PHENYLEPHRINE 80 MCG/ML (10ML) SYRINGE FOR IV PUSH (FOR BLOOD PRESSURE SUPPORT): 80.0000 ug | PREFILLED_SYRINGE | INTRAVENOUS | Status: DC | PRN

## 2021-08-01 MED ORDER — TRANEXAMIC ACID-NACL 1000-0.7 MG/100ML-% IV SOLN
INTRAVENOUS | Status: AC
  Filled 2021-08-01: qty 100

## 2021-08-01 MED ORDER — SIMETHICONE 80 MG PO CHEW: 80.0000 mg | CHEWABLE_TABLET | ORAL | Status: DC | PRN

## 2021-08-01 MED ORDER — ETOMIDATE 2 MG/ML IV SOLN: INTRAVENOUS | Status: DC | PRN

## 2021-08-01 MED ORDER — PANTOPRAZOLE 2 MG/ML SUSPENSION: 40.0000 mg | Freq: Every day | ORAL | Status: DC

## 2021-08-01 MED ORDER — POLYETHYLENE GLYCOL 3350 17 G PO PACK: 17.0000 g | PACK | Freq: Every day | ORAL | Status: DC | PRN

## 2021-08-01 MED ORDER — SODIUM CHLORIDE 0.9 % IV SOLN
INTRAVENOUS | Status: DC | PRN
  Administered 2021-08-01: 2 g via INTRAVENOUS

## 2021-08-01 MED ORDER — COCONUT OIL OIL
1.0000 | TOPICAL_OIL | Status: DC | PRN
Start: 2021-08-01 — End: 2021-08-10
  Filled 2021-08-01: qty 120

## 2021-08-01 MED ORDER — IOHEXOL 300 MG/ML  SOLN
100.0000 mL | Freq: Once | INTRAMUSCULAR | Status: AC | PRN
Start: 2021-08-01 — End: 2021-08-01
  Administered 2021-08-01: 20 mL via INTRA_ARTERIAL

## 2021-08-01 MED ORDER — SODIUM BICARBONATE 8.4 % IV SOLN
INTRAVENOUS | Status: AC
  Filled 2021-08-01: qty 150

## 2021-08-01 MED ORDER — FENTANYL CITRATE (PF) 250 MCG/5ML IJ SOLN
INTRAMUSCULAR | Status: DC | PRN
  Administered 2021-08-01: 100 ug via INTRAVENOUS
  Administered 2021-08-01 (×2): 50 ug via INTRAVENOUS

## 2021-08-01 MED ORDER — ALBUMIN HUMAN 5 % IV SOLN: INTRAVENOUS | Status: DC | PRN

## 2021-08-01 MED ORDER — STERILE WATER FOR IRRIGATION IR SOLN
Status: DC | PRN
  Administered 2021-08-01 (×2): 1000 mL

## 2021-08-01 MED ORDER — CALCIUM GLUCONATE 10 % IV SOLN
INTRAVENOUS | Status: DC | PRN
  Administered 2021-08-01 (×6): 1 g via INTRAVENOUS

## 2021-08-01 MED ORDER — ZOLPIDEM TARTRATE 5 MG PO TABS
5.0000 mg | ORAL_TABLET | Freq: Every evening | ORAL | Status: DC | PRN
Start: 2021-08-01 — End: 2021-08-02

## 2021-08-01 MED ORDER — TRANEXAMIC ACID-NACL 1000-0.7 MG/100ML-% IV SOLN: 1000.0000 mg | INTRAVENOUS | Status: AC

## 2021-08-01 MED ORDER — LACTATED RINGERS IV SOLN
500.0000 mL | Freq: Once | INTRAVENOUS | Status: AC
  Administered 2021-08-01: 500 mL via INTRAVENOUS

## 2021-08-01 MED ORDER — TRANEXAMIC ACID-NACL 1000-0.7 MG/100ML-% IV SOLN
INTRAVENOUS | Status: AC
  Administered 2021-08-01: 1000 mg via INTRAVENOUS
  Filled 2021-08-01: qty 100

## 2021-08-01 MED ORDER — LIDOCAINE HCL (PF) 1 % IJ SOLN
INTRAMUSCULAR | Status: DC | PRN
  Administered 2021-08-01: 2 mL via EPIDURAL
  Administered 2021-08-01: 10 mL via EPIDURAL

## 2021-08-01 MED ORDER — SENNOSIDES-DOCUSATE SODIUM 8.6-50 MG PO TABS
2.0000 | ORAL_TABLET | Freq: Every day | ORAL | Status: DC
Start: 2021-08-02 — End: 2021-08-01

## 2021-08-01 MED ORDER — FENTANYL CITRATE (PF) 100 MCG/2ML IJ SOLN
INTRAMUSCULAR | Status: DC | PRN
  Administered 2021-08-01: 50 ug via INTRAVENOUS

## 2021-08-01 MED ORDER — CALCIUM CHLORIDE 10 % IV SOLN
INTRAVENOUS | Status: DC | PRN
  Administered 2021-08-01 (×2): 100 mg via INTRAVENOUS
  Administered 2021-08-01: 500 mg via INTRAVENOUS
  Administered 2021-08-01 (×8): 100 mg via INTRAVENOUS
  Administered 2021-08-01: 500 mg via INTRAVENOUS
  Administered 2021-08-01: 100 mg via INTRAVENOUS

## 2021-08-01 MED ORDER — VANCOMYCIN HCL 2000 MG/400ML IV SOLN
2000.0000 mg | Freq: Once | INTRAVENOUS | Status: DC
  Filled 2021-08-01: qty 400

## 2021-08-01 MED ORDER — DEXTROSE 50 % IV SOLN
25.0000 g | INTRAVENOUS | Status: AC
  Administered 2021-08-01: 25 g via INTRAVENOUS

## 2021-08-01 MED ORDER — FUROSEMIDE 10 MG/ML IJ SOLN
INTRAMUSCULAR | Status: DC | PRN
  Administered 2021-08-01 (×2): 10 mg via INTRAVENOUS

## 2021-08-01 MED ORDER — LIDOCAINE HCL 1 % IJ SOLN
INTRAMUSCULAR | Status: AC
  Filled 2021-08-01: qty 20

## 2021-08-01 MED ORDER — IOHEXOL 300 MG/ML  SOLN: 100.0000 mL | Freq: Once | INTRAMUSCULAR | Status: DC | PRN

## 2021-08-01 MED ORDER — POLYETHYLENE GLYCOL 3350 17 G PO PACK: 17.0000 g | PACK | Freq: Every day | ORAL | Status: DC

## 2021-08-01 MED ORDER — ETOMIDATE 2 MG/ML IV SOLN
INTRAVENOUS | Status: DC | PRN
  Administered 2021-08-01: 12 mg via INTRAVENOUS

## 2021-08-01 MED ORDER — PROPOFOL 500 MG/50ML IV EMUL
INTRAVENOUS | Status: DC | PRN
  Administered 2021-08-01: 125 ug/kg/min via INTRAVENOUS
  Administered 2021-08-01: 60 ug/kg/min via INTRAVENOUS

## 2021-08-01 MED ORDER — GELATIN ABSORBABLE 12-7 MM EX MISC
CUTANEOUS | Status: AC
  Filled 2021-08-01: qty 2

## 2021-08-01 MED ORDER — VASOPRESSIN 20 UNIT/ML IV SOLN
INTRAVENOUS | Status: AC
  Filled 2021-08-01: qty 1

## 2021-08-01 MED ORDER — MISOPROSTOL 200 MCG PO TABS: 800.0000 ug | ORAL_TABLET | Freq: Once | ORAL | Status: AC

## 2021-08-01 MED ORDER — CEFAZOLIN SODIUM-DEXTROSE 2-4 GM/100ML-% IV SOLN
2.0000 g | Freq: Once | INTRAVENOUS | Status: DC
Start: 2021-08-01 — End: 2021-08-01
  Filled 2021-08-01: qty 100

## 2021-08-01 MED ORDER — ROCURONIUM BROMIDE 100 MG/10ML IV SOLN
INTRAVENOUS | Status: DC | PRN
  Administered 2021-08-01: 80 mg via INTRAVENOUS
  Administered 2021-08-01 (×2): 50 mg via INTRAVENOUS
  Administered 2021-08-01 (×2): 30 mg via INTRAVENOUS

## 2021-08-01 MED ORDER — MISOPROSTOL 200 MCG PO TABS
ORAL_TABLET | ORAL | Status: AC
  Administered 2021-08-01: 800 ug via RECTAL
  Filled 2021-08-01: qty 4

## 2021-08-01 MED ORDER — WITCH HAZEL-GLYCERIN EX PADS
1.0000 "application " | MEDICATED_PAD | CUTANEOUS | Status: DC | PRN
  Filled 2021-08-01: qty 100

## 2021-08-01 MED ORDER — DIPHENHYDRAMINE HCL 25 MG PO CAPS
25.0000 mg | ORAL_CAPSULE | Freq: Four times a day (QID) | ORAL | Status: DC | PRN
Start: 2021-08-01 — End: 2021-08-02

## 2021-08-01 MED ORDER — CEFAZOLIN SODIUM-DEXTROSE 1-4 GM/50ML-% IV SOLN
1.0000 g | INTRAVENOUS | Status: AC
  Administered 2021-08-01: 1 g via INTRAVENOUS
  Filled 2021-08-01 (×2): qty 50

## 2021-08-01 MED ORDER — ACETAMINOPHEN 325 MG PO TABS: 650.0000 mg | ORAL_TABLET | ORAL | Status: DC | PRN

## 2021-08-01 MED ORDER — IOHEXOL 300 MG/ML  SOLN
100.0000 mL | Freq: Once | INTRAMUSCULAR | Status: AC | PRN
  Administered 2021-08-01: 90 mL via INTRA_ARTERIAL

## 2021-08-01 MED ORDER — VANCOMYCIN VARIABLE DOSE PER UNSTABLE RENAL FUNCTION (PHARMACIST DOSING)
Status: DC
  Filled 2021-08-01: qty 1

## 2021-08-01 MED ORDER — FUROSEMIDE 10 MG/ML IJ SOLN
INTRAMUSCULAR | Status: AC
  Filled 2021-08-01: qty 2

## 2021-08-01 MED ORDER — GELATIN ABSORBABLE 12-7 MM EX MISC
CUTANEOUS | Status: AC
  Filled 2021-08-01: qty 1

## 2021-08-01 MED ORDER — SODIUM CHLORIDE 0.9 % IV SOLN: INTRAVENOUS | Status: DC | PRN

## 2021-08-01 MED ORDER — SUCCINYLCHOLINE CHLORIDE 200 MG/10ML IV SOSY
PREFILLED_SYRINGE | INTRAVENOUS | Status: DC | PRN
  Administered 2021-08-01: 160 mg via INTRAVENOUS

## 2021-08-01 MED ORDER — FENTANYL CITRATE (PF) 100 MCG/2ML IJ SOLN
50.0000 ug | INTRAMUSCULAR | Status: DC | PRN
  Administered 2021-08-01: 100 ug via INTRAVENOUS
  Filled 2021-08-01: qty 2

## 2021-08-01 MED ORDER — PRENATAL MULTIVITAMIN CH
1.0000 | ORAL_TABLET | Freq: Every day | ORAL | Status: DC
  Filled 2021-08-01 (×2): qty 1

## 2021-08-01 MED ORDER — PROPOFOL 1000 MG/100ML IV EMUL
0.0000 ug/kg/min | INTRAVENOUS | Status: DC
  Administered 2021-08-01 (×2): 40 ug/kg/min via INTRAVENOUS
  Filled 2021-08-01 (×2): qty 100

## 2021-08-01 MED ORDER — VASOPRESSIN 20 UNIT/ML IV SOLN
INTRAVENOUS | Status: DC | PRN
  Administered 2021-08-01: 2 [IU] via INTRAVENOUS
  Administered 2021-08-01: 1 [IU] via INTRAVENOUS
  Administered 2021-08-01: 2 [IU] via INTRAVENOUS
  Administered 2021-08-01 (×2): 1 [IU] via INTRAVENOUS
  Administered 2021-08-01: 2 [IU] via INTRAVENOUS

## 2021-08-01 MED ORDER — PANTOPRAZOLE SODIUM 40 MG IV SOLR: 40.0000 mg | Freq: Every day | INTRAVENOUS | Status: DC

## 2021-08-01 MED ORDER — BENZOCAINE-MENTHOL 20-0.5 % EX AERO
1.0000 | INHALATION_SPRAY | CUTANEOUS | Status: DC | PRN
Start: 2021-08-01 — End: 2021-08-10
  Filled 2021-08-01: qty 56

## 2021-08-01 MED ORDER — CALCIUM CHLORIDE 10 % IV SOLN
INTRAVENOUS | Status: AC
  Filled 2021-08-01: qty 10

## 2021-08-01 MED ORDER — SODIUM BICARBONATE 8.4 % IV SOLN
INTRAVENOUS | Status: DC | PRN
  Administered 2021-08-01 (×4): 50 meq via INTRAVENOUS

## 2021-08-01 MED ORDER — PIPERACILLIN-TAZOBACTAM 3.375 G IVPB
3.3750 g | Freq: Three times a day (TID) | INTRAVENOUS | Status: DC
  Administered 2021-08-01 – 2021-08-04 (×8): 3.375 g via INTRAVENOUS
  Filled 2021-08-01 (×8): qty 50

## 2021-08-01 MED ORDER — SODIUM CHLORIDE 0.9 % IR SOLN
Status: DC | PRN
Start: 2021-08-01 — End: 2021-08-01
  Administered 2021-08-01 (×2): 1000 mL

## 2021-08-01 MED ORDER — METHYLENE BLUE 1 % INJ SOLN
INTRAVENOUS | Status: AC
  Filled 2021-08-01: qty 10

## 2021-08-01 MED ORDER — IBUPROFEN 200 MG PO TABS: 600.0000 mg | ORAL_TABLET | Freq: Four times a day (QID) | ORAL | Status: DC

## 2021-08-01 MED ORDER — LACTATED RINGERS IV BOLUS: 1000.0000 mL | Freq: Once | INTRAVENOUS | Status: DC

## 2021-08-01 MED ORDER — DOCUSATE SODIUM 100 MG PO CAPS: 100.0000 mg | ORAL_CAPSULE | Freq: Two times a day (BID) | ORAL | Status: DC | PRN

## 2021-08-01 MED ORDER — MISOPROSTOL 200 MCG PO TABS: 200.0000 ug | ORAL_TABLET | Freq: Once | ORAL | Status: DC

## 2021-08-01 MED ORDER — FENTANYL-BUPIVACAINE-NACL 0.5-0.125-0.9 MG/250ML-% EP SOLN
12.0000 mL/h | EPIDURAL | Status: DC | PRN
  Filled 2021-08-01: qty 250

## 2021-08-01 SURGICAL SUPPLY — 47 items
CATH FOLEY LATEX FREE 14FR (CATHETERS) ×1
CATH FOLEY LF 14FR (CATHETERS) IMPLANT
CATH ROBINSON RED A/P 16FR (CATHETERS) ×3 IMPLANT
CONT PATH 16OZ SNAP LID 3702 (MISCELLANEOUS) ×3 IMPLANT
DRSG OPSITE POSTOP 4X10 (GAUZE/BANDAGES/DRESSINGS) ×1 IMPLANT
FLOSEAL 10ML (HEMOSTASIS) ×1 IMPLANT
GAUZE PACKING 1INX5YD STRL (GAUZE/BANDAGES/DRESSINGS) ×1 IMPLANT
GLOVE BIOGEL PI IND STRL 7.0 (GLOVE) ×2 IMPLANT
GLOVE BIOGEL PI INDICATOR 7.0 (GLOVE) ×1
GLOVE SURG SS PI 7.0 STRL IVOR (GLOVE) ×3 IMPLANT
GLOVE SURG UNDER POLY LF SZ7.5 (GLOVE) ×3 IMPLANT
GOWN STRL REUS W/ TWL LRG LVL3 (GOWN DISPOSABLE) ×2 IMPLANT
GOWN STRL REUS W/ TWL XL LVL3 (GOWN DISPOSABLE) ×2 IMPLANT
GOWN STRL REUS W/TWL LRG LVL3 (GOWN DISPOSABLE) ×7
GOWN STRL REUS W/TWL XL LVL3 (GOWN DISPOSABLE) ×4
HIBICLENS CHG 4% 4OZ BTL (MISCELLANEOUS) ×3 IMPLANT
LIGASURE IMPACT 36 18CM CVD LR (INSTRUMENTS) ×1 IMPLANT
NDL SPNL 20GX3.5 QUINCKE YW (NEEDLE) IMPLANT
NEEDLE SPNL 20GX3.5 QUINCKE YW (NEEDLE) ×3 IMPLANT
NS IRRIG 1000ML POUR BTL (IV SOLUTION) ×3 IMPLANT
PACK VAGINAL MINOR WOMEN LF (CUSTOM PROCEDURE TRAY) ×3 IMPLANT
PACK VAGINAL WOMENS (CUSTOM PROCEDURE TRAY) ×2 IMPLANT
PAD OB MATERNITY 4.3X12.25 (PERSONAL CARE ITEMS) ×3 IMPLANT
PAD PREP 24X48 CUFFED NSTRL (MISCELLANEOUS) ×3 IMPLANT
SET CYSTO W/LG BORE CLAMP LF (SET/KITS/TRAYS/PACK) ×1 IMPLANT
SPONGE T-LAP 18X18 ~~LOC~~+RFID (SPONGE) ×10 IMPLANT
STAPLER VISISTAT 35W (STAPLE) ×1 IMPLANT
SUT PDS AB 0 CTX 60 (SUTURE) ×1 IMPLANT
SUT PLAIN 2 0 (SUTURE) ×1
SUT PLAIN ABS 2-0 CT1 27XMFL (SUTURE) IMPLANT
SUT SILK 2 0 SH (SUTURE) ×3 IMPLANT
SUT VIC AB 0 CT1 18XCR BRD8 (SUTURE) IMPLANT
SUT VIC AB 0 CT1 27 (SUTURE) ×4
SUT VIC AB 0 CT1 27XBRD ANBCTR (SUTURE) IMPLANT
SUT VIC AB 0 CT1 27XCR 8 STRN (SUTURE) IMPLANT
SUT VIC AB 0 CT1 8-18 (SUTURE) ×3
SUT VIC AB 2-0 CT1 (SUTURE) ×1 IMPLANT
SUT VIC AB 2-0 CT1 27 (SUTURE) ×2
SUT VIC AB 2-0 CT1 TAPERPNT 27 (SUTURE) IMPLANT
SUT VIC AB 3-0 SH 27 (SUTURE) ×1
SUT VIC AB 3-0 SH 27X BRD (SUTURE) IMPLANT
SUT VICRYL 0 TIES 12 18 (SUTURE) ×1 IMPLANT
SYR 20ML LL LF (SYRINGE) ×1 IMPLANT
SYR CONTROL 10ML LL (SYRINGE) ×1 IMPLANT
SYR TOOMEY IRRIG 70ML (MISCELLANEOUS) ×3
SYRINGE TOOMEY IRRIG 70ML (MISCELLANEOUS) IMPLANT
TOWEL OR 17X24 6PK STRL BLUE (TOWEL DISPOSABLE) ×6 IMPLANT

## 2021-08-01 NOTE — Transfer of Care (Signed)
Immediate Anesthesia Transfer of Care Note ? ?Patient: Debra Bernard ? ?Procedure(s) Performed: IR WITH ANESTHESIA ? ?Patient Location: ICU ? ?Anesthesia Type:General and Epidural ? ?Level of Consciousness: Patient remains intubated per anesthesia plan ? ?Airway & Oxygen Therapy: Patient remains intubated per anesthesia plan and Patient placed on Ventilator (see vital sign flow sheet for setting) ? ?Post-op Assessment: Report given to RN and Post -op Vital signs reviewed and stable ? ?Post vital signs: Reviewed and stable ? ?Last Vitals:  ?Vitals Value Taken Time  ?BP 102/72 08/01/21 1800  ?Temp    ?Pulse 101 08/01/21 1808  ?Resp 10 08/01/21 1808  ?SpO2 100 % 08/01/21 1808  ?Vitals shown include unvalidated device data. ? ?Last Pain:  ?Vitals:  ? 08/01/21 0850  ?TempSrc: Axillary  ?PainSc:   ?   ? ?  ? ?Complications: No notable events documented. ?

## 2021-08-01 NOTE — Anesthesia Procedure Notes (Signed)
Procedure Name: Intubation ?Date/Time: 08/01/2021 10:08 AM ?Performed by: Josephine Igo, MD ?Pre-anesthesia Checklist: Patient identified, Emergency Drugs available, Suction available and Patient being monitored ?Patient Re-evaluated:Patient Re-evaluated prior to induction ?Oxygen Delivery Method: Circle system utilized ?Preoxygenation: Pre-oxygenation with 100% oxygen ?Induction Type: IV induction, Rapid sequence and Cricoid Pressure applied ?Laryngoscope Size: Glidescope and 3 ?Grade View: Grade II ?Tube type: Oral ?Number of attempts: 1 ?Airway Equipment and Method: Stylet ?Placement Confirmation: ETT inserted through vocal cords under direct vision, positive ETCO2 and breath sounds checked- equal and bilateral ?Secured at: 22 cm ?Tube secured with: Tape ? ? ? ? ?

## 2021-08-01 NOTE — Progress Notes (Signed)
Patient is a 36 yo g4p2012 at 40+0 here for iol for unstable lie. My team was called to the room at around 8:19 this morning for prolonged deceleration. Was found to be complete and plus 1 station. HR improved to about 110. We immediately started pushing w/ my CNM. I was called back to the room at 8:29 for fetal heart rate in the 80s. Infant was crowning. I applied vacuum and with two contractions, no pop-offs, infant was delivered direct OP. Body was delivered without difficulty. Infant notably warm to the touch, with dusky color and poor tone. Cord was immediately clamped and cut and infant handed to resuscitation team. Maternal care transferred to [facility].  ?

## 2021-08-01 NOTE — Brief Op Note (Addendum)
08/01/2021 ? ?5:12 PM ? ?PATIENT:  Debra Bernard  36 y.o. female ? ?PRE-OPERATIVE DIAGNOSIS:  postpartum hemorrhage, DIC ? ?POST-OPERATIVE DIAGNOSIS:  postpartum hemorrhage, DIC, hysterectomy  ? ?PROCEDURE:  EUA, D&C, Supracervical hysterectomy, LSO, cystoscopy, and vaginal packing placed ? 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 ?SURGEON:   ?   [name], MD - Primary ? ?   * Chancy Milroy, MD - Assisting ?   Waymon Amato, MD - Assisting ?   Royston Sinner, Colin Benton, MD - Assisting ?   Renard Matter, MD - Fellow ? ?CONSULTS: urology, IR, vascular surgery ? ?ANESTHESIA:   epidural (still in place) converted to general ? ?EBL:  approximately 5L ? ?BLOOD ADMINISTERED: ?Prior to going to IR ?PRBC 13 ?FFP 10 ?Platelets 5 ?Cryto 5 ? ?IVF: Crystalloid 3L, albumin 1L ? ?DRAINS:  indwelling foley   ? ?LOCAL MEDICATIONS USED:  NONE ? ?SPECIMEN:  uterus, left fallopian tube and ovary ? ?DISPOSITION OF SPECIMEN:  PATHOLOGY ? ?COUNTS:  YES and xray done which was negative ? ?DICTATION: .Note written in EPIC ? ?PLAN OF CARE: Admit to inpatient  ? ?PATIENT DISPOSITION:  ICU - intubated and critically ill en route to IR ? ?Patient status guarded after IR ?  ?Delay start of Pharmacological VTE agent (>24hrs) due to surgical blood loss or risk of bleeding: yes ? ?Durene Romans MD ?Attending ?Center for [employer] Fish farm manager) ?08/01/2021 ?Time: 3267 ? ? ?

## 2021-08-01 NOTE — Anesthesia Procedure Notes (Signed)
Epidural ?Patient location during procedure: OB ?Start time: 08/01/2021 3:57 AM ?End time: 08/01/2021 4:09 AM ? ?Staffing ?Anesthesiologist: Pervis Hocking, DO ?Performed: anesthesiologist  ? ?Preanesthetic Checklist ?Completed: patient identified, IV checked, risks and benefits discussed, monitors and equipment checked, pre-op evaluation and timeout performed ? ?Epidural ?Patient position: sitting ?Prep: DuraPrep and site prepped and draped ?Patient monitoring: continuous pulse ox, blood pressure, heart rate and cardiac monitor ?Approach: midline ?Location: L3-L4 ?Injection technique: LOR air ? ?Needle:  ?Needle type: Tuohy  ?Needle gauge: 17 G ?Needle length: 9 cm ?Needle insertion depth: 6 cm ?Catheter type: closed end flexible ?Catheter size: 19 Gauge ?Catheter at skin depth: 11 cm ?Test dose: negative ? ?Assessment ?Sensory level: T8 ?Events: blood not aspirated, injection not painful, no injection resistance, no paresthesia and negative IV test ? ?Additional Notes ?Patient identified. Risks/Benefits/Options discussed with patient including but not limited to bleeding, infection, nerve damage, paralysis, failed block, incomplete pain control, headache, blood pressure changes, nausea, vomiting, reactions to medication both or allergic, itching and postpartum back pain. Confirmed with bedside nurse the patient's most recent platelet count. Confirmed with patient that they are not currently taking any anticoagulation, have any bleeding history or any family history of bleeding disorders. Patient expressed understanding and wished to proceed. All questions were answered. Sterile technique was used throughout the entire procedure. Please see nursing notes for vital signs. Test dose was given through epidural catheter and negative prior to continuing to dose epidural or start infusion. Warning signs of high block given to the patient including shortness of breath, tingling/numbness in hands, complete motor  block, or any concerning symptoms with instructions to call for help. Patient was given instructions on fall risk and not to get out of bed. All questions and concerns addressed with instructions to call with any issues or inadequate analgesia.  Reason for block:procedure for pain ? ? ? ?

## 2021-08-01 NOTE — Op Note (Signed)
Operative Note ? ?Preoperative diagnosis:  ?1.  Possible ureteral injury ? ?Postoperative diagnosis: ?1.  Effluxing urine was seen from both ureteral orifices  ? ?Procedure(s): ?1.  Cystoscopy with exam under anesthesia  ? ?Surgeon: Debra Hughs, MD ? ?Assistants:  None ? ?Anesthesia:  General ? ?Complications:  None ? ?EBL:  none ? ?Specimens: ?1. None ? ?Drains/Catheters: ?1.  24 F 2-way Foley with 10 mL of sterile water in the balloon  ? ?Intraoperative findings:   ?Methylene blue tinged urine could be seen effluxing from both ureteral orifices ? ?Indication:  Debra Bernard is a 36 y.o. female with persistent hemorrhage following vaginal delivery this morning requiring supra-cervial hysterectomy with Dr. Ilda Bernard.  Intraoperatively, Dr. Ilda Bernard did not observe efflux of methylene blue from either ureteral orifice and was concerned for possible ureteral injury.  ? ?Description of procedure: ? ?The patient was already under general anesthesia so informed consent could not be obtained.  A 23 French rigid cystoscope was then inserted into the bladder.  There was a moderate amount of clot within the bladder obscuring view of the ureteral orifices.  I was able to siphoned the clot burden out of the bladder.  A complete bladder survey revealed some ecchymosis along the posterior bladder wall with no other intravesical abnormalities.  I observed both ureteral orifices for several minutes and saw methylene blue refluxing from both several times.  The rigid cystoscope was then removed.  A 24 French two-way Foley catheter was then inserted.  The balloon was inflated with 10 mL of sterile water.  The bladder was then hand irrigated until free of clot.  The catheter was then placed to gravity drainage.  Dr. Ilda Bernard then proceeded with the rest of his abdominal/pelvic exploration. ? ?Plan: Removal of Foley catheter per primary team ? ?

## 2021-08-01 NOTE — Progress Notes (Signed)
Pharmacy Antibiotic Note ? ?Debra Bernard is a 36 y.o. female admitted on 07/31/2021 with sepsis. PMH significant for s/p emergent hysterectomy, s/p vaginal delivery. Pharmacy has been consulted for vancomycin and Zosyn dosing. ? ?Patient had presented for labor and delivery. Delivery complicated with bleeding, code hemorrhage called and massive transfusion protocol was initiated.  ? ?Renal function appears to be elevated with Scr 1.5, though no known baseline. WBC is elevated at 14.9 this AM. Patient febrile this AM with Tmax 101 F 30 minutes after delivery. Will pursue vancomycin variable dosing given patient's AKI, contrast use, and variable pharmacokinetic parameters (renal clearance and volume of distribution) with pregnancy.  ? ?Plan: ?Vancomycin 2g IV x 1 ?Vancomycin random level 24h post loading dose  (1300 4/25) ?If vanc random < 20, will start Vancomycin IV '1500mg'$  q 24h (eAUC 429, Scr 1.52) ?Zosyn 3.375 g IV q8h (extended infusion) ?Monitor renal function, clinical status and siigns of improvement ? ?Height: '5\' 5"'$  (165.1 cm) ?Weight: 90.3 kg (199 lb) ?IBW/kg (Calculated) : 57 ? ?Temp (24hrs), Avg:98.6 ?F (37 ?C), Min:97.6 ?F (36.4 ?C), Max:101 ?F (38.3 ?C) ? ?Recent Labs  ?Lab 07/31/21 ?9924 08/01/21 ?0950  ?WBC 5.9 16.6*  ?  ?CrCl cannot be calculated (No successful lab value found.).   ? ?No Known Allergies ? ?Antimicrobials this admission: ?4/24 penicillin G >> 4/24 ?4/24 cefazolin x 1 (pre-op dose) ?4/24 cefotetan x 1 ? 4/24 Zosyn >> ?4/24 vancomycin >> ? ?Microbiology results: ?4/24 BCx: pending  ?4/24 Ucx: pending ?3/29 Strep Gp B NAA positive ? ?Thank you for involving pharmacy in this patient's care. ? ?Elita Quick, PharmD ?PGY1 Ambulatory Care Pharmacy Resident ?08/01/2021 12:27 PM ? ?**Pharmacist phone directory can be found on [email or web address] listed under [place]** ? ?

## 2021-08-01 NOTE — Sedation Documentation (Signed)
Pt arrived from [facility] OR with anesthesia and OB team. Pt safely moved to IR table with max assistance. Pt under the care of anesthesia at this time. ?

## 2021-08-01 NOTE — Progress Notes (Signed)
Date and time results received: 08/01/21 1859 ?(use smartphrase ".now" to insert current time) ? ?Test: CBG ?Critical Value: 57; recheck at [place] = 97 ? ?Name of Provider Notified:  ? ?Orders Received? Or Actions Taken?: Actions Taken ?

## 2021-08-01 NOTE — Anesthesia Procedure Notes (Signed)
Central Venous Catheter Insertion ?Performed by: Darral Dash, DO, anesthesiologist ?Start/End4/24/2023 11:15 AM, 08/01/2021 11:20 AM ?Patient location: OR. ?Emergency situation ?Preanesthetic checklist: patient identified ?Position: Trendelenburg ?Patient sedated ?Hand hygiene performed  and maximum sterile barriers used  ?Catheter size: 8 Fr ?Total catheter length 16. ?Central line was placed.Double lumen ?Procedure performed using ultrasound guided technique. ?Ultrasound Notes:anatomy identified, needle tip was noted to be adjacent to the nerve/plexus identified and no ultrasound evidence of intravascular and/or intraneural injection ?Attempts: 1 ?Following insertion, dressing applied, line sutured and Biopatch. ?Post procedure assessment: blood return through all ports and free fluid flow ? ?Patient tolerated the procedure well with no immediate complications. ? ? ? ?

## 2021-08-01 NOTE — Discharge Summary (Signed)
? ?  Postpartum Discharge Summary ? ?  ?Patient Name: Debra Bernard ?DOB: [birth year] ?MRN: [id number] ? ?Date of admission: 07/31/2021 ?Delivery date:08/01/2021  ?Delivering provider: Wende Mott  ?Date of discharge: 08/10/2021 ? ?Admitting diagnosis: Supervision of high risk elderly multigravida in third trimester [O09.523] ?Intrauterine pregnancy: [redacted]w[redacted]d    ?Secondary diagnosis:  Active Problems: ?  Third-stage postpartum hemorrhage, postpartum ?  Elevated LFTs ?  Acute blood loss anemia ?  DIC (disseminated intravascular coagulation) ([place] ?  PPH (postpartum hemorrhage) ?  Shock liver ?  AKI (acute kidney injury) ([place] ?  Thrombocytopenia due to blood loss ?  Postoperative fever ?  S/P abdominal supracervical subtotal hysterectomy ? ?Additional problems: None    ?Discharge diagnosis: Term Pregnancy Delivered and PPH                                           ?Post partum procedures:blood transfusion ?Augmentation: AROM, Pitocin, Cytotec, and IP Foley ?Complications: Intrauterine Inflammation or infection (Chorioamniotis) and Hemorrhage>10068m Hysterectomy and hemorrhagic shock ? ?Hospital course: Induction of Labor With Vaginal Delivery   ?[age].o. yo [id number] 4070w0ds admitted to the hospital 07/31/2021 for induction of labor.  Indication for induction: Unstable lie.    She progressed with cytotec, foley bulb, pitocin and AROM augmentation to complete. CNM called to bedside due to prolonged deceleration. Cervix found to be 10/100/+1 station. Dr. WouSi Raideresent at assessment. Began pushing with minimal maternal effort. Given fetal bradycardia and minimal movement with pushing, MD proceeded with vacuum delivery which was successful. ?Patient had a labor course as follows: ?Membrane Rupture Time/Date: 4:45 AM ,08/01/2021   ?Delivery Method:Vaginal, Spontaneous  ?Episiotomy: None  ?Lacerations:  2nd degree  ?Details of delivery can be found in separate delivery note.  After delivery of infant, infant and maternal  temperature felt elevated at the touch. Maternal temperature taken and found to be 101 s/p tylenol approximately 30 minutes before. 2g Ancef ordered. Brisk bright red bleeding after delivery of placenta. Uterotonics were given, uterine sweep done, JADA placed.  Bleeding continued and Code Hemorrhage was activated.  Bleeding persisted despite interventions by Drs. Wouk and PicTRW Automotiveatient became hypotensive.  EBL in room was about 1500 ml.  She was taken to the OR for further management. ? ?In the operating room, she continued to lose blood and had evidence of DIC requiring activation of Code MTP and received many units of blood products.  She underwent a supracervical hysterectomy and left salpingo-oophorectomy.  Intraoperative consults of Vascular Surgery and Urology were called to help.  EBL during surgery was about 7000 ml. She still had significant bleeding and the decision was made for her to have Interventional Radiology intervention. Prior to going to IR, she received PRBC 13U, FFP 10U, Platelets 5U, Cryo 5U, Crystalloid 3L, Albumin 1L. Please see operative note for more details. ? ?In IR, she underwent successful percutaneous coil embolization of the right uterine artery with Gel-Foam embolization of the bilateral internal iliac arteries for life-threatening postpartum hemorrhage despite supracervical hysterectomy.  After IR procedure, she was sent still intubated  and sedated to ICU. Please refer to IR notes for more details. ? ?While in ICU, she was treated for hemorrhagic shock, secondary acidemia, DIC, shock liver/HELLP, shock kidney, acute respiratory failure, encephalopathy. She was weaned off pressors, weaned off vent on POD#2 and required a lot of support from Nephrology, Gastroenterology,  Hematology, TRH and other specialists to treat effects on her organs.  She received additional blood products as needed.  She had several laboratory studies and scans to evaluate her elevated LFTs, Cr, and other  electrolytes that were treated/repleted accordingly.  She was moved to Med-Surg floor/OBSC on POD#3.  She required Physical Therapy help to help with ambulation. She continued to be evaluated by the various specialists and was slowly noted to improve over time.  As each of her organ systems improved, the specialists signed off accordingly and routine postoperative postpartum care was instituted. ? ?By time of discharge on POD#9, her pain was controlled on oral pain medications; she was ambulating, voiding without difficulty, tolerating regular diet and passing flatus.  She was deemed stable for discharge to home.   ? ? ?Newborn Data: ?Birth date:08/01/2021  ?Birth time:8:31 AM  ?Gender:Female  ?Living status:Living  ?Apgars:2 ,6  ?Weight:3.515 kg  ? ?Magnesium Sulfate received: No ?BMZ received: No ?Rhophylac:N/A ? ?Physical exam  ?Vitals:  ? 08/09/21 2307 08/10/21 0428 08/10/21 0805 08/10/21 1215  ?BP: 130/75 119/72 128/88 123/70  ?Pulse: 94 87 95 99  ?Resp: '17 16 19 17  '$ ?Temp: 98.2 ?F (36.8 ?C) 98.4 ?F (36.9 ?C) 99.9 ?F (37.7 ?C) 99 ?F (37.2 ?C)  ?TempSrc: Oral Oral Oral Oral  ?SpO2: 100% 100% 100% 100%  ?Weight:      ?Height:      ? ?General: alert, cooperative, and no distress ?Lochia: appropriate ?Uterine Fundus: firm ?Incision: Healing well with no significant drainage. Staples removed, steristrips placed on vertical incision ?DVT Evaluation: No evidence of DVT seen on physical exam. Negative Homan's sign. ?No cords or calf tenderness.No significant calf/ankle edema. ? ?Labs: ? ?  Latest Ref Rng & Units 08/09/2021  ?  4:50 AM 08/08/2021  ?  4:15 AM 08/07/2021  ?  5:12 AM  ?CBC  ?WBC 4.0 - 10.5 K/uL 15.7   13.7   12.1    ?Hemoglobin 12.0 - 15.0 g/dL 10.0   9.8   9.8    ?Hematocrit 36.0 - 46.0 % 31.6   28.6   29.5    ?Platelets 150 - 400 K/uL 279   209   162    ? ? ?  Latest Ref Rng & Units 08/09/2021  ?  4:50 AM 08/08/2021  ?  4:15 AM 08/07/2021  ?  5:12 AM  ?CMP  ?Glucose 70 - 99 mg/dL 112   90   84    ?BUN 6 - 20 mg/dL  20   21   32    ?Creatinine 0.44 - 1.00 mg/dL 1.38   1.54   1.67    ?Sodium 135 - 145 mmol/L 137   138   138    ?Potassium 3.5 - 5.1 mmol/L 4.3   3.7   3.3    ?Chloride 98 - 111 mmol/L 109   110   108    ?CO2 22 - 32 mmol/L '19   23   23    '$ ?Calcium 8.9 - 10.3 mg/dL 8.2   7.8   8.1    ?Total Protein 6.5 - 8.1 g/dL 5.2   4.7   4.5    ?Total Bilirubin 0.3 - 1.2 mg/dL 0.8   0.8   0.7    ?Alkaline Phos 38 - 126 U/L 66   62   63    ?AST 15 - 41 U/L 36   37   68    ?ALT 0 - 44 U/L 229  302   456    ? ? ?Imaging: ?DG Chest 2 View ? ?Result Date: 08/07/2021 ?CLINICAL DATA:  Postoperative fever EXAM: CHEST - 2 VIEW COMPARISON:  08/03/2021 FINDINGS: Endotracheal and enteric tubes as well as right IJ central venous catheter have been removed. Normal heart size. Low lung volumes. No focal airspace consolidation, pleural effusion, or pneumothorax. IMPRESSION: Low lung volumes.  No active cardiopulmonary disease. Electronically Signed   By: Davina Poke D.O.   On: 08/07/2021 10:29  ? ?IR Angiogram Selective Each Additional Vessel ? ?Result Date: 08/02/2021 ?INDICATION: Postpartum hemorrhage. Patient now presents for pelvic arteriogram and potential embolization. EXAM: 1. ULTRASOUND GUIDANCE FOR ARTERIAL ACCESS 2. FLUSH PELVIC ARTERIOGRAM 3. SELECTIVE RIGHT INTERNAL ILIAC ARTERIOGRAM AND FLUOROSCOPIC GUIDED GEL-FOAM EMBOLIZATION 4. SELECTIVE RIGHT 5. SELECTIVE LEFT INTERNAL ILIAC ARTERIOGRAM AND FLUOROSCOPIC GUIDED GEL-FOAM EMBOLIZATION MEDICATIONS: Patient admitted to the hospital receiving intravenous antibiotics. The antibiotic was administered within one hour of the procedure ANESTHESIA/SEDATION: Patient intubated and sedated by the anesthesia team. CONTRAST:  110 cc Omnipaque 300 FLUOROSCOPY TIME:  27 minutes, 48 seconds (8,675 mGy) COMPLICATIONS: None immediate. PROCEDURE: Emergency consent was obtained prior to the initiation of the procedure. A time out was performed prior to the initiation of the procedure. Maximal  barrier sterile technique utilized including caps, mask, sterile gowns, sterile gloves, large sterile drape, hand hygiene, and standard prep. The right femoral head was marked fluoroscopically. Under sterile c

## 2021-08-01 NOTE — Progress Notes (Signed)
POD0 ? ?Subjective: ?intubated ? ?Objective: ?Blood pressure 93/76, pulse (!) 118, temperature 97.7 ?F (36.5 ?C), temperature source Axillary, resp. rate (!) 22, height '5\' 5"'$  (1.651 m), weight 90.3 kg, last menstrual period 10/25/2020, SpO2 100 %, unknown if currently breastfeeding. ? ?Physical Exam:  ?General: stable, intubated ?CV: tachycardic ?Pulm: ventilated ?Incision: no new drainage, incision intact ?GU: Vaginal packing noted to have blood staining, pad placed and no new blood staining.  ? ? ?  Latest Ref Rng & Units 08/01/2021  ?  9:06 PM 08/01/2021  ?  6:25 PM 08/01/2021  ?  3:55 PM  ?CBC  ?WBC 4.0 - 10.5 K/uL 12.5      ?Hemoglobin 12.0 - 15.0 g/dL 9.6   7.5   9.2    ?Hematocrit 36.0 - 46.0 % 25.6   22.0   27.0    ?Platelets 150 - 400 K/uL 103      ? ? ?  Latest Ref Rng & Units 08/01/2021  ?  6:25 PM 08/01/2021  ?  3:55 PM 08/01/2021  ?  2:46 PM  ?CMP  ?Glucose 70 - 99 mg/dL   129    ?BUN 6 - 20 mg/dL   10    ?Creatinine 0.44 - 1.00 mg/dL   1.54    ?Sodium 135 - 145 mmol/L 143   143   142    ?Potassium 3.5 - 5.1 mmol/L 3.1   3.5   4.1    ?Chloride 98 - 111 mmol/L   105    ?CO2 22 - 32 mmol/L   18    ?Calcium 8.9 - 10.3 mg/dL   9.1    ?Total Protein 6.5 - 8.1 g/dL   3.6    ?Total Bilirubin 0.3 - 1.2 mg/dL   2.1    ?Alkaline Phos 38 - 126 U/L   48    ?AST 15 - 41 U/L   100    ?ALT 0 - 44 U/L   80    ? ?Lactic Acid, Venous ?   ?Component Value Date/Time  ? LATICACIDVEN 4.1 (HH) 08/01/2021 1800  ? ? ? ? ?Assessment/Plan: ?POD#0 s/p SVD complicated by PPH/DIC s/p D&C followed by Supracervical hysterectomy, LSO, cystoscopy and vaginal packing following by VIR consult and placement of gelfoam/coil ?Neuro - sedated with propofol ?CV - tachycardic but still equilibrated. Hemoglobin likely hemo-concentrated but also then likely stable and thus consistent with her clinical picture. May still need additional blood products. She is off neo drip. A-line present ?Pulm - ventilated per ICU protocol/recommendations ?GI - NPO.  LFTs trended - injury likely due to shock.  ?GU - continue pad counts, nurse overnight [place] Ar) will notify me if soaking pads (this would be a change). Continue foley.  ?Heme - DIC seems to be resolved after multiple blood products.  ?ID - on zosyn and vancomycin. Patient pan-cultured. ?Nephro - AKI due to ATN  ?DVT proph - SCDs ? ?Discussed at length again sequence of events, diagnosis and current stability with family. All questions answered.  ? ? LOS: 1 day  ? ?Radene Gunning ?08/01/2021, 9:57 PM  ? ? ?

## 2021-08-01 NOTE — Lactation Note (Addendum)
This note was copied from a baby's chart. ?Lactation Consultation Note ? ?Patient Name: Debra Bernard ?Today's Date: 08/01/2021 ?  ?Age:36 hours ? ?Lactation attempted to see mom. Mom being taken to OR. Lactation will follow up on Mother Baby.  ?  ? ? ? ?[place], IBCLC ?08/01/2021, 9:33 AM ? ? ? ?

## 2021-08-01 NOTE — Progress Notes (Addendum)
eLink Physician-Brief Progress Note ?Patient Name: Saharra Santo ?DOB: October 19, 1985 ?MRN: [id number] ? ? ?Date of Service ? 08/01/2021  ?HPI/Events of Note ? Patient with soft blood pressures and sinus tachycardia. Lactic acid 2.6.  ?eICU Interventions ? BNP and CVP ordered to assess volume status, peripheral Phenylphrine gtt ordered PRN MAP < 65 mmHg.  ? ? ? ?  ? ?Kerry Kass Ladonna Vanorder ?08/01/2021, 11:07 PM ?

## 2021-08-01 NOTE — Anesthesia Preprocedure Evaluation (Signed)
Anesthesia Evaluation  ?Patient identified by MRN, date of birth, ID band ?Patient awake ? ? ? ?Reviewed: ?Allergy & Precautions, Patient's Chart, lab work & pertinent test results ? ?Airway ?Mallampati: II ? ?TM Distance: >3 FB ?Neck ROM: Full ? ? ? Dental ?no notable dental hx. ? ?  ?Pulmonary ?neg pulmonary ROS,  ?  ?Pulmonary exam normal ?breath sounds clear to auscultation ? ? ? ? ? ? Cardiovascular ?negative cardio ROS ?Normal cardiovascular exam ?Rhythm:Regular Rate:Normal ? ? ?  ?Neuro/Psych ?negative neurological ROS ? negative psych ROS  ? GI/Hepatic ?negative GI ROS, Neg liver ROS,   ?Endo/Other  ?negative endocrine ROS ? Renal/GU ?negative Renal ROS  ?negative genitourinary ?  ?Musculoskeletal ?negative musculoskeletal ROS ?(+)  ? Abdominal ?  ?Peds ?negative pediatric ROS ?(+)  Hematology ?negative hematology ROS ?(+) Hb 11.6, plt 180   ?Anesthesia Other Findings ? ? Reproductive/Obstetrics ?(+) Pregnancy ? ?  ? ? ? ? ? ? ? ? ? ? ? ? ? ?  ?  ? ? ? ? ? ? ? ? ?Anesthesia Physical ?Anesthesia Plan ? ?ASA: 2 ? ?Anesthesia Plan: Epidural  ? ?Post-op Pain Management:   ? ?Induction:  ? ?PONV Risk Score and Plan: 2 ? ?Airway Management Planned: Natural Airway ? ?Additional Equipment: None ? ?Intra-op Plan:  ? ?Post-operative Plan:  ? ?Informed Consent: I have reviewed the patients History and Physical, chart, labs and discussed the procedure including the risks, benefits and alternatives for the proposed anesthesia with the patient or authorized representative who has indicated his/her understanding and acceptance.  ? ? ? ? ? ?Plan Discussed with:  ? ?Anesthesia Plan Comments:   ? ? ? ? ? ? ?Anesthesia Quick Evaluation ? ?

## 2021-08-01 NOTE — Anesthesia Procedure Notes (Signed)
Procedure Name: Intubation ?Date/Time: 08/01/2021 10:08 AM ?Performed by: Josephine Igo, MD ?Pre-anesthesia Checklist: Suction available and Patient being monitored ?Patient Re-evaluated:Patient Re-evaluated prior to induction ?Oxygen Delivery Method: Circle system utilized ?Preoxygenation: Pre-oxygenation with 100% oxygen ?Induction Type: IV induction, Rapid sequence and Cricoid Pressure applied ?Grade View: Grade I ?Tube type: Oral ?Tube size: 7.0 mm ?Number of attempts: 1 ?Airway Equipment and Method: Rigid stylet and Video-laryngoscopy ?Placement Confirmation: ETT inserted through vocal cords under direct vision, positive ETCO2 and breath sounds checked- equal and bilateral ?Secured at: 21 cm ?Tube secured with: Tape ?Dental Injury: Teeth and Oropharynx as per pre-operative assessment  ? ? ? ? ?

## 2021-08-01 NOTE — Progress Notes (Signed)
Interventional Radiology Brief Note: ? ?IR consulted for emergent embolization in patient with post-partum hemorrhage s/p hysterectomy with ongoing oozing and DIC.   ? ?Patient being resuscitated by Anesthesia.  ? ?IR to proceed with angiogram possible embolization with emergent consent.  ? ?Brynda Greathouse, MS RD PA-C ? ?

## 2021-08-01 NOTE — H&P (Addendum)
? ?NAME:  Debra Bernard, MRN:  [id number], DOB:  1985/07/04, LOS: 1 ?ADMISSION DATE:  07/31/2021, CONSULTATION DATE:  08/01/2021 ?REFERRING MD: Dr. Si Raider , CHIEF COMPLAINT:  Post partum Hemorrhage / Emergent Hysterectomy/ DIC and co-agulopathy  ? ?History of Present Illness:  ?All information  obtained from medical records as patient is intubated and sedated ?36 year old female  [phone] at 99+0. Baby had decelerations and vacuum was used to deliver neonate.  Neonate  was notably warm to the touch, with dusky color and poor tone, infant handed to resuscitation / NICU team . ?After delivery maternal temp was taken and was 101 after tylenol was taken.  ?There was Brisk bright red bleeding after delivery of placenta.  Patient was given Pitocin bolus  and IM methergine given with minimal effect.  She received TXA given. Fundus remained firm but brisk bleeding noted. Sweep of uterus revealed no clots or placental fragments. Dr. Si Raider notified of bleeding and intent to place JADA, With initiation of JADA, 219m of blood immediately filled canister and brisk bright red bleeding noted around JADA device. JADA device was removed , uterus and cervix was inspected with no concerning lacerations. A second dose of TXA given at 15 minutes past first dose Despite all interventions, patient continued to have brisk bright red bleeding and became symptomatic and hypotensive. Decision was made to proceed to OR for further management of bleeding. ? ?Pt had an emergency hysterectomy and left oophorectomy in the OR. Labs confirmed DIC.  ?She has received multiple PRBCs, FFP, cryo and platelets with massive transfusion protocol.  Despite surgical exploration patient continued noted having bleeding, she was taken to IR for angiogram and right ovarian artery was embolized, patient remained intubated and sedated and was transferred to ICU for further care ? ?INR > 10, PT > 90, aPTT > 200, Fibrinogen > < 60, D dimer > 20, platelets  ? ? ?Of note  GBSP + on 3/29>> treated with PCN 4/23 ?WBC bumped from 5.9 to 16.6 overnight  ?Cord pH  was 6.9 ?Pro calcitonin 0.58 ?Creatinine 1.54/ Albumin 2.1/ AST 100/ ALT 80/ Total Bili 2.1 ?GFR 19 and Gap 19 at 1445 ? ?Pertinent  Medical History  ?[id number] ?Vaginal delivery 08/01/21 ?Hysterectomy 08/01/2021 ?No further medical history noted in EPIC ? ? ?Significant Hospital Events: ?Including procedures, antibiotic start and stop dates in addition to other pertinent events   ?08/01/2021 Admission and induction vaginal delivery of female infant who was dusky and without tone , transferred to NICU.  ?Emergent Hysterectomy 4/24 ?Massive Transfusion Protocol 10:07 4/24 ?Central Line insertion R IJ 16 cm. 4/24 ?IR for embolization 4/24 ? ?Interim History / Subjective:  ? ? ?Objective   ?Blood pressure (!) 141/101, pulse (!) 195, temperature (!) 101 ?F (38.3 ?C), temperature source Axillary, resp. rate 18, height '5\' 5"'$  (1.651 m), weight 90.3 kg, last menstrual period 10/25/2020, SpO2 99 %, unknown if currently breastfeeding. ?   ?   ? ?Intake/Output Summary (Last 24 hours) at 08/01/2021 1215 ?Last data filed at 08/01/2021 1213 ?Gross per 24 hour  ?Intake 8450 ml  ?Output 1300 ml  ?Net 7150 ml  ? ?Filed Weights  ? 07/31/21 0815  ?Weight: 90.3 kg  ? ? ?Examination: ?  ?Physical exam: ?General: Crtitically ill-appearing female, orally intubated ?HEENT: [place]/AT, eyes anicteric.  ETT and OGT in place ?Neuro: Sedated, not following commands.  Eyes are closed.  Pupils 3 mm bilateral reactive to light ?Chest: Coarse breath sounds, no wheezes or rhonchi ?Heart:  Regular rate and rhythm, no murmurs or gallops ?Abdomen: Soft, distended, sluggish bowel sounds present ?Skin: No rash ? ? ? ?Resolved Hospital Problem list   ?Delivery of female infant ? ?Assessment & Plan:  ?Hemorrhagic shock s/p multiple blood products with massive transfusion protocol ?ABLA ?Persistent Uterine Hemorrhage following vaginal delivery/ DIC ?S/p emergent angiogram and  right ovarian embolectomy ?Post emergent hysterectomy 4/24 ?Lactic acidosis ?Patient remains on IV vasopressors ?Titrate with map goal 65 ?After multiple blood products, DIC labs showed some improvement ?Monitor H&H and DIC panel ?Plan ?Trend INR / PT/ aPTT/ Fibrinogen/ CBC for resolution  ?Transfuse as needed >> Massive transfusion protocol in progress ?Gobal TEG to help guide product resuscitation  ?Pressors to maintain MAP > 65, add Levophed upon arrival to ICU ?Trend lactate ? ?Probable sepsis as contributing etiology for DIC and shock ?GBSP + on 3/29>> treated with PCN 4/23 ?Follow-up blood Cx and Urine Cx. / sputum Cx  ?Continue IV vancomycin and Zosyn, adjust antibiotic according to culture and sensitivity results ? ?Acute kidney injury due to ischemic ATN ?Acute metabolic/respiratory acidosis ?Hypocalcemia due to multiple blood product transfusion ?Serum creatinine has trended up ?Continue IV fluid resuscitation ?Maintain MAP 65 ?Avoid nephrotoxic agent ?Patient was seen multiple boluses of IV bicarbonate ?Trend ABGs ?Vent setting was adjusted to clear respiratory acidosis ?Patient received multiple doses of IV calcium ? ?Acute hypoxic/hypercapnic respiratory failure ?High risk TRALI after massive transfusions  ?Continue ventilator support with lung protective strategies  ?Wean PEEP and FiO2 for sats greater than 90%. ?Head of bed elevated 30 degrees. ?Plateau pressures less than 30 cm H20.  ?SAT/SBT as tolerated, mentation preclude extubation  ?Ensure adequate pulmonary hygiene  ?Follow blood cultures , urine Cx  ?VAP bundle in place  ?PAD protocol  ?ABG and CXR prn  ? ?Shock liver ?Trend LFT's ?Maintain MAP > 65 ?Avoid hepatotoxic agents ? ? ?Best Practice (right click and "Reselect all SmartList Selections" daily)  ? ?Diet/type: NPO ?DVT prophylaxis: SCD ?GI prophylaxis: PPI ?Lines: Central line ?Foley:  Yes, and it is still needed ?Code Status:  full code ?Last date of multidisciplinary goals of care  discussion [pending] ? ?Labs   ?CBC: ?Recent Labs  ?Lab 07/31/21 ?3244 08/01/21 ?0102 08/01/21 ?7253 08/01/21 ?1130 08/01/21 ?1141  ?WBC 5.9  --  16.6*  --   --   ?HGB 11.6*  --  9.0*  --  7.8*  ?HCT 34.6*  --  27.1*  --  23.0*  ?MCV 87.8  --  88.9  --   --   ?PLT 180 156 158 PENDING  --   ? ? ?Basic Metabolic Panel: ?Recent Labs  ?Lab 08/01/21 ?1141  ?NA 136  ?K 5.7*  ? ?GFR: ?CrCl cannot be calculated (No successful lab value found.). ?Recent Labs  ?Lab 07/31/21 ?6644 08/01/21 ?0950  ?WBC 5.9 16.6*  ? ? ?Liver Function Tests: ?No results for input(s): AST, ALT, ALKPHOS, BILITOT, PROT, ALBUMIN in the last 168 hours. ?No results for input(s): LIPASE, AMYLASE in the last 168 hours. ?No results for input(s): AMMONIA in the last 168 hours. ? ?ABG ?   ?Component Value Date/Time  ? HCO3 12.7 (L) 08/01/2021 1141  ? TCO2 14 (L) 08/01/2021 1141  ? ACIDBASEDEF 17.0 (H) 08/01/2021 1141  ? O2SAT 75 08/01/2021 1141  ?  ? ?Coagulation Profile: ?Recent Labs  ?Lab 08/01/21 ?0923 08/01/21 ?1130  ?INR >10.0* 2.2*  ? ? ?Cardiac Enzymes: ?No results for input(s): CKTOTAL, CKMB, CKMBINDEX, TROPONINI in the last 168 hours. ? ?  HbA1C: ?Hgb A1c MFr Bld  ?Date/Time Value Ref Range Status  ?01/13/2021 11:37 AM 5.7 (H) 4.8 - 5.6 % Final  ?  Comment:  ?           Prediabetes: 5.7 - 6.4 ?         Diabetes: >6.4 ?         Glycemic control for adults with diabetes: <7.0 ?  ? ? ?CBG: ?No results for input(s): GLUCAP in the last 168 hours. ? ?Review of Systems:   ? Unable as patient is intubated and sedated ? ?Past Medical History:  ?She,  has a past medical history of Medical history non-contributory.  ? ?Surgical History:  ? ?Past Surgical History:  ?Procedure Laterality Date  ? NO PAST SURGERIES    ?  ? ?Social History:  ? reports that she has never smoked. She has never used smokeless tobacco. She reports that she does not drink alcohol and does not use drugs.  ? ?Family History:  ?Her family history includes Varicose Veins in her father. There  is no history of Alcohol abuse, Arthritis, Asthma, Birth defects, Cancer, COPD, Depression, Diabetes, Drug abuse, Early death, Hearing loss, Hyperlipidemia, Heart disease, Hypertension, Kidney disease, Learnin

## 2021-08-01 NOTE — Progress Notes (Addendum)
Labor Progress Note ?Debra Bernard is a 36 y.o. [id number] at 68w0dpresented for IOL at term and known unstable lie.  ? ?S: Patient is resting comfortably. No concerns.  ? ?O:  ?BP 139/87   Pulse 84   Temp 98.1 ?F (36.7 ?C) (Oral)   Resp 17   Ht '5\' 5"'$  (1.651 m)   Wt 90.3 kg   LMP 10/25/2020   SpO2 100%   BMI 33.12 kg/m?  ?EFM: 155 bpm/minimal variability/+accels  ? ?CVE: Dilation: 5 ?Effacement (%): 50 ?Station: -3 ?Presentation: Vertex ?Exam by:: Dr. DCy Blamer? ?BSUS used to confirm vertex. ? ?A&P: 36y.o. [id number] for IOL ?#Labor: Progressing well. AROM completed during current check. Tolerated well by patient and fetus. ?#Pain: epidural ?#FWB: Cat 1 ?#GBS positive > PCN ? ? ?ChArmanda MagicMedical Student ?4:51 AM  ? ? ?GME ATTESTATION:  ?I saw and evaluated the patient. I agree with the findings and the plan of care as documented in the student's note and have made all necessary edits ? ?AnRenard MatterMD, MPH ?OB Fellow, Faculty Practice ?[facility] ?08/01/2021 10:06 AM ? ?

## 2021-08-01 NOTE — Anesthesia Postprocedure Evaluation (Signed)
Anesthesia Post Note ? ?Patient: Debra Bernard ? ?Procedure(s) Performed: IR WITH ANESTHESIA ? ?  ? ?Patient location during evaluation: ICU ?Anesthesia Type: General ?Level of consciousness: patient remains intubated per anesthesia plan ?Vital Signs Assessment: post-procedure vital signs reviewed and stable ?Respiratory status: patient on ventilator - see flowsheet for VS and patient remains intubated per anesthesia plan ?Cardiovascular status: stable and tachycardic ?Anesthetic complications: no ?Comments: Patient taken directly from IR to 47M Rm. 07 with full monitors. O2 '@100'$ % via Ambu to ETT. Connected to ventilator. Report given to ICU Rn's by CRNA. ? ? ?No notable events documented. ? ?Last Vitals:  ?Vitals:  ? 08/01/21 1758 08/01/21 1833  ?BP:    ?Pulse:    ?Resp:    ?Temp:  36.6 ?C  ?SpO2: 100%   ?  ?Last Pain:  ?Vitals:  ? 08/01/21 1833  ?TempSrc: Axillary  ?PainSc:   ? ?Pain Goal:   ? ?  ?  ?  ?  ?  ?  ?  ? ?Debra Bernard A. ? ? ? ? ?

## 2021-08-01 NOTE — Anesthesia Preprocedure Evaluation (Signed)
Anesthesia Evaluation  ?Patient identified by MRN, date of birth, ID band ?Patient awake ? ? ? ?Reviewed: ?Allergy & Precautions, NPO status , Patient's Chart, lab work & pertinent test results ? ?Airway ?Mallampati: II ? ?TM Distance: >3 FB ?Neck ROM: Full ? ? ? Dental ?no notable dental hx. ?(+) Teeth Intact, Caps ?  ?Pulmonary ?neg pulmonary ROS,  ?  ?Pulmonary exam normal ?breath sounds clear to auscultation ? ? ? ? ? ? Cardiovascular ?negative cardio ROS ?Normal cardiovascular exam ?Rhythm:Regular Rate:Normal ? ? ?  ?Neuro/Psych ?negative neurological ROS ? negative psych ROS  ? GI/Hepatic ?negative GI ROS, Neg liver ROS,   ?Endo/Other  ?negative endocrine ROS ? Renal/GU ?negative Renal ROS  ?negative genitourinary ?  ?Musculoskeletal ?negative musculoskeletal ROS ?(+)  ? Abdominal ?  ?Peds ?negative pediatric ROS ?(+)  Hematology ?negative hematology ROS ?(+) Hb 11.6, plt 180   ?Anesthesia Other Findings ? ? Reproductive/Obstetrics ?(+) Pregnancy ? ?  ? ? ? ? ? ? ? ? ? ? ? ? ? ?  ?  ? ? ? ? ? ? ? ? ?Anesthesia Physical ? ?Anesthesia Plan ? ?ASA: 2 and emergent ? ?Anesthesia Plan: Epidural  ? ?Post-op Pain Management:   ? ?Induction: Intravenous ? ?PONV Risk Score and Plan: 2 and 4 or greater and Treatment may vary due to age or medical condition, Midazolam and Ondansetron ? ?Airway Management Planned: Natural Airway and Nasal Cannula ? ?Additional Equipment: None ? ?Intra-op Plan:  ? ?Post-operative Plan:  ? ?Informed Consent: I have reviewed the patients History and Physical, chart, labs and discussed the procedure including the risks, benefits and alternatives for the proposed anesthesia with the patient or authorized representative who has indicated his/her understanding and acceptance.  ? ? ? ? ? ?Plan Discussed with: CRNA and Anesthesiologist ? ?Anesthesia Plan Comments:   ? ? ? ? ? ? ?Anesthesia Quick Evaluation ? ?

## 2021-08-01 NOTE — Progress Notes (Addendum)
I was asked to join a laparotomy in process for postdelivery bleeding. ?The patient is status post hysterectomy and has diffuse oozing from all surfaces.  She is undergoing transfusion through central venous catheter.  Radial arterial line had just been placed as I entered the room.  I scrubbed into the case and evaluated the abdomen.  There are several areas of very mild bleeding.  There is no active evidence of hemorrhage.  I assisted the obstetric team in finalizing control of bleeding.  No vascular intervention was required.  I will be available to assist at any point, should the patient need.  Please call for questions. ? ?Yevonne Aline. Stanford Breed, MD ?[facility] ?Office Phone Number: [phone] ?08/01/2021 11:52 AM ? ?

## 2021-08-01 NOTE — Op Note (Signed)
Operative Note  ? ?08/01/2021 ? ?PRE-OP DIAGNOSIS ?*Status post vacuum assisted vaginal delivery ?*Postpartum hemorrhage ?*DIC ?  ?POST-OP DIAGNOSIS ?*Same  ? ?SURGEON: [name], MD - Primary ?ASSISTANT: ?   * Chancy Milroy, MD - Assisting ?   Waymon Amato, MD - Assisting ?   Royston Sinner, Colin Benton, MD - Assisting ?   Renard Matter, MD - Fellow ? ?PROCEDURE: Exam under anesthesia. Dilation and Curettage. Exploratory laparotomy via vertical midline incision. Supracervical hysterectomy and left salpingo-oophorectomy. Cystoscopy. Vaginal packing placed ? ?CONSULTS: Vascular Surgery. Urology  ? ?ANESTHESIA: Epidural and local ? ?ESTIMATED BLOOD LOSS: 7088m ? ?DRAINS: indwelling foley. Difficult to ascertain UOP due to blood UOP and cystoscopy  ? ?TOTAL IV FLUIDS:  ?Prior to going to VIR ?PRBC 13U ?FFP 10U ?Platelets 5U ?Cryo 5U ?Crystalloid 3L ?Albumin 1L ? ?VTE PROPHYLAXIS: SCDs to the bilateral lower extremities ? ?ANTIBIOTICS: Patient received Ancef 2gm in the L&D room. She also received cefotetan in the OR ? ?SPECIMENS: Uterus, left tube and ovary ? ?DISPOSITION: PACU - hemodynamically stable. ? ?CONDITION: stable ? ?COMPLICATIONS: DIC and persistent bleeding ? ?INDICATIONS: I was called to the L&D room after Dr. WSi Raiderstated he had performed and uncomplicated outlet vacuum delivery for non reassuring fetal heart rate, with bleeding refractory to uterotonics (methergine, Lysteda) and Jada uterine balloon placement. I was called to assess for possible cervical laceration.  ? ?When I arrived, the placenta was out and intact, per CNM, and the JCorey Skainswas out and her epidural was working well. I assessed the cervix circumferentially and cervix intact and she had a 1st degree perineal laceration. She had persistent bleeding but her uterus was firm at the fundus and the lower uterine segment so decision made to place the JMurchisonagain, but she had persistent bleeding so decision made to take her to the OR. EBL in L&D  room approximately 15072m? ?FINDINGS:  ?No adhesions or blood intra-abdominally prior to hysterectomy. Normal postpartum uterus, tubes and ovaries. On cystoscopy, blood in the area of the trigone with some clots but +efflux of blue urine noted on cystoscopy with urology. Normal bladder and urethra. ? ?Persistent vaginal bleeding post abdominal closure so decision made to go to VIR for possible embolization  ? ?DESCRIPTION OF PROCEDURE: Patient placed in dorsal lithotomy position, and a weighted speculum and Dever placed. The cervix was, again, evaluated and no tears noted. D&C done with large curette with gritty texture in all four quadrants and no tissue return noted.  Continued profuse bleeding that was wine colored and concern for DIC given its consistency so decision made to proceed with ex-lap and possible hysterectomy. ?Patient was prepped abdominally and vertical midline incision made with the scalpel and abdomen bluntly entered with no blood intra-abdominally. Once inside the abdominal cavity, a self-retaining Alexis retractor was placed to expose the pelvic cavity with 3 lap sponges. The round ligaments on either side were identified and individually dissected and ligated with #0 Vicryl suture and divided. This allowed me  to then create a bladder flap by both blunt and sharp dissection.  Kelly clamps were then used to grasp the utero-ovarian and the fallopian tubes. These were clamped again, cut and then suture ligated with #0 Vicryl suture. ? ?I then skeletonized the uterine vessels on either side and carefully dissected the bladder flap anteriorly. Posteriorly, the peritoneum was dissected down toward the uterosacral ligaments. Heaney clamps were then placed at each isthmic portion of the cervical body junction where  the uterine arteries adjoined the uterus. These were clamped, ligated and divided using #0 Vicryl suture. Using cautery, the uterus was transected away and handed off.  The cervical stump  was then oversewn with 0-vicryl with figure of eight sutures.The peritoneum at the surgical sites were oozy and oversewn with vicryl suture and patient had small but persistent and diffuse oozing, particularly at the left tube and ovary. I did a salpingectomy there with kelly clamps and suture ligation but persistent bleeding so decision made to proceed with left oophorectomy by placing vicryl free time under the pedicle and then suture ligating, cutting and removing the specimen  ? ?Throughout the procedure, the bleeding lessened and her operative sites looked good, and I would pack her and recheck and blood would be noted in the abdomen but no obvious sites for the bleeding with just small diffuse oozying, in addition to her VS going from normal to hypotensive and tachycardic.  ? ?I went to cysto the patient and efflux noted from the left ureter and none from the right so indigo ordered, and as I waited for efflux, she, again, became hypotensive and tachycardic so I changed gloves and unpacked her and same blood noted in the abdomen with no obvious site save for small places with oozing that was coagulated with bovie, Ligasure and suture ligation.  ? ?Urology came in by this point and had a normal cystoscopy and her I, again, looked at the operative site for several minutes with no welling up or bleeding so floseal was applied to all operative sites and decision made to close her.  ? ?Fasica closed with looped 0-PDS and tied in the middle, subcutaenous tissue closed with 2-0 plain gut and skin closed with staples after a negative x-ray which was done, despite a normal count, but to ensure ? ?During this time, her VS again became concerning and bleeding noted vaginally. Her catheter pulled out easily and likely Urology had accidentally placed the foley in the vagina and not in the bladder so a new one was placed. Unable to assess the cervix because it was higher and the vaginal bleeding so decision made to pack her  and proceed to IR for evaluation. 1-0 perineal laceration repaired with 2-0 vicryl in the usual fashion.  ? ?Patient taken to IR intubated and in critical condition.  ? ?Durene Romans MD ?Attending ?[facility] [employer] [facility]) ? ? ?

## 2021-08-01 NOTE — Progress Notes (Addendum)
OB Note ?08/01/21 ?1733 ?Family (room 507) updated on critical nature of patient's status and plan of care to watch her carefully overnight in the ICU, trend labs and vitals, as VS stayed stable with BPs in the 110s/70s and HR in the 90s after initiation of her IR procedure.  ? ?Durene Romans MD ?Attending ?[facility] [employer] [facility]) ?GYN Consult Phone: [phone] (M-F, 0800-1700) & [phone] (Off hours, weekends, holidays) ? ?

## 2021-08-01 NOTE — Consult Note (Signed)
Urology Consult  ? ?Physician requesting consult: [name], MD  ? ?Reason for consult: Possible ureteral injury ? ?History of Present Illness: Debra Bernard is a 36 y.o. female with persistent hemorrhage following vaginal delivery this morning requiring supra-cervial hysterectomy with Dr. Ilda Basset.  Intraoperatively, Dr. Ilda Basset did not observe efflux of methylene blue from either ureteral orifice and was concerned for possible ureteral injury.  The patient was under general anesthesia at the time of consultation so historical questions could not be obtained ? ?Past Medical History:  ?Diagnosis Date  ? Medical history non-contributory   ? ? ?Past Surgical History:  ?Procedure Laterality Date  ? NO PAST SURGERIES    ? ? ?Current Hospital Medications: ? ?Home Meds:  ?Current Meds  ?Medication Sig  ? Prenatal Vit-Fe Fumarate-FA (PREPLUS) 27-1 MG TABS Take 1 tablet by mouth daily.  ? ? ?Scheduled Meds: ? [MAR Hold] sodium chloride   Intravenous Once  ? sodium chloride   Intravenous Once  ? sodium chloride   Intravenous Once  ? sodium chloride   Intravenous Once  ? sodium chloride   Intravenous Once  ? sodium chloride   Intravenous Once  ? sodium chloride   Intravenous Once  ? docusate  100 mg Per Tube BID  ? gelatin adsorbable      ? indigotindisulfonate  5 mL Intravenous STAT  ? lidocaine      ? [MAR Hold] oxytocin 40 units in LR 1000 mL  333 mL Intravenous Once  ? pantoprazole (PROTONIX) IV  40 mg Intravenous Daily  ? pantoprazole sodium  40 mg Per Tube Daily  ? polyethylene glycol  17 g Per Tube Daily  ? sodium bicarbonate      ? vancomycin variable dose per unstable renal function (pharmacist dosing)   Does not apply See admin instructions  ? ?Continuous Infusions: ? fentaNYL 2 mcg/mL w/bupivacaine 0.125% in NS 250 mL    ? lactated ringers    ? Followed by  ? lactated ringers    ? [MAR Hold] lactated ringers 500 mL (07/31/21 1047)  ? lactated ringers 125 mL/hr at 07/31/21 0851  ? oxytocin 2.5 Units/hr (08/01/21  0912)  ? [MAR Hold] oxytocin Stopped (08/01/21 8502)  ? piperacillin-tazobactam (ZOSYN)  IV    ? propofol (DIPRIVAN) infusion    ? tranexamic acid    ? tranexamic acid    ? [MAR Hold] tranexamic acid    ? vancomycin    ? ?PRN Meds:.[MAR Hold] acetaminophen, [MAR Hold] diphenhydrAMINE, [MAR Hold] ePHEDrine, [MAR Hold] ePHEDrine, fentaNYL (SUBLIMAZE) injection, fentaNYL (SUBLIMAZE) injection, fentaNYL 2 mcg/mL w/bupivacaine 0.125% in NS 250 mL, [MAR Hold] lactated ringers, [MAR Hold] lidocaine (PF), [MAR Hold] misoprostol, [MAR Hold] misoprostol, [MAR Hold] ondansetron, [MAR Hold] phenylephrine, [MAR Hold] phenylephrine, sodium chloride irrigation, [MAR Hold] sodium citrate-citric acid, sterile water for irrigation, [MAR Hold] terbutaline, [MAR Hold] terbutaline ? ?Allergies: No Known Allergies ? ?Family History  ?Problem Relation Age of Onset  ? Varicose Veins Father   ? Alcohol abuse Neg Hx   ? Arthritis Neg Hx   ? Asthma Neg Hx   ? Birth defects Neg Hx   ? Cancer Neg Hx   ? COPD Neg Hx   ? Depression Neg Hx   ? Diabetes Neg Hx   ? Drug abuse Neg Hx   ? Early death Neg Hx   ? Hearing loss Neg Hx   ? Hyperlipidemia Neg Hx   ? Heart disease Neg Hx   ? Hypertension Neg Hx   ?  Kidney disease Neg Hx   ? Learning disabilities Neg Hx   ? Mental illness Neg Hx   ? Mental retardation Neg Hx   ? Miscarriages / Stillbirths Neg Hx   ? Stroke Neg Hx   ? Vision loss Neg Hx   ? ? ?Social History:  reports that she has never smoked. She has never used smokeless tobacco. She reports that she does not drink alcohol and does not use drugs. ? ?ROS: ?Could not be obtained  ? ?Physical Exam:  ?Vital signs in last 24 hours: ?Temp:  [97.6 ?F (36.4 ?C)-101 ?F (38.3 ?C)] 101 ?F (38.3 ?C) (04/24 0850) ?Pulse Rate:  [73-195] 195 (04/24 0919) ?Resp:  [16-18] 18 (04/24 0731) ?BP: (80-227)/(51-152) 141/101 (04/24 0919) ?SpO2:  [99 %-100 %] 99 % (04/24 0850) ?Constitutional:  Under general anesthesia. ? ?Laboratory Data:  ?Recent Labs  ?   07/31/21 ?8185 08/01/21 ?6314 08/01/21 ?9702 08/01/21 ?1130 08/01/21 ?1141 08/01/21 ?1445  ?WBC 5.9  --  16.6* 14.9*  --   --   ?HGB 11.6*  --  9.0* 8.4* 7.8*  --   ?HCT 34.6*  --  27.1* 27.0* 23.0*  --   ?PLT 180 156 158 87*  93*  --  PENDING  ? ? ?Recent Labs  ?  08/01/21 ?1130 08/01/21 ?1141  ?NA 137 136  ?K 5.9* 5.7*  ?CL 108  --   ?GLUCOSE 278*  --   ?BUN 9  --   ?CALCIUM 7.5*  --   ?CREATININE 1.52*  --   ? ? ? ?Results for orders placed or performed during the hospital encounter of 07/31/21 (from the past 24 hour(s))  ?DIC Panel ONCE - STAT     Status: Abnormal  ? Collection Time: 08/01/21  9:23 AM  ?Result Value Ref Range  ? Prothrombin Time >90.0 (H) 11.4 - 15.2 seconds  ? INR >10.0 (HH) 0.8 - 1.2  ? aPTT >200 (HH) 24 - 36 seconds  ? Fibrinogen <60 (LL) 210 - 475 mg/dL  ? D-Dimer, Quant >20.00 (H) 0.00 - 0.50 ug/mL-FEU  ? Platelets 156 150 - 400 K/uL  ? Smear Review NO SCHISTOCYTES SEEN   ?Prepare RBC (crossmatch)     Status: None  ? Collection Time: 08/01/21  9:30 AM  ?Result Value Ref Range  ? Order Confirmation    ?  ORDER PROCESSED BY BLOOD BANK ?Performed at [facility], [address], [place], [place] [address] ?  ?Prepare fresh frozen plasma     Status: None (Preliminary result)  ? Collection Time: 08/01/21  9:41 AM  ?Result Value Ref Range  ? Unit Number [id number]   ? Blood Component Type THW PLS APHR   ? Unit division 00   ? Status of Unit ISSUED   ? Transfusion Status OK TO TRANSFUSE   ? Unit Number [id number]   ? Blood Component Type THW PLS APHR   ? Unit division A0   ? Status of Unit ISSUED   ? Transfusion Status OK TO TRANSFUSE   ? Unit Number [id number]   ? Blood Component Type THW PLS APHR   ? Unit division A0   ? Status of Unit ISSUED   ? Transfusion Status OK TO TRANSFUSE   ? Unit Number [id number]   ? Blood Component Type THW PLS APHR   ? Unit division A0   ? Status of Unit ISSUED   ? Transfusion Status OK TO TRANSFUSE   ? Unit Number [id number]   ?  Blood  Component Type THW PLS APHR   ? Unit division 00   ? Status of Unit ISSUED   ? Transfusion Status OK TO TRANSFUSE   ? Unit Number [id number]   ? Blood Component Type THW PLS APHR   ? Unit division A0   ? Status of Unit ISSUED   ? Transfusion Status OK TO TRANSFUSE   ? Unit Number [id number]   ? Blood Component Type THW PLS APHR   ? Unit division A0   ? Status of Unit ISSUED   ? Transfusion Status OK TO TRANSFUSE   ? Unit Number [id number]   ? Blood Component Type THW PLS APHR   ? Unit division 00   ? Status of Unit ISSUED   ? Transfusion Status OK TO TRANSFUSE   ? Unit Number [id number]   ? Blood Component Type THW PLS APHR   ? Unit division A0   ? Status of Unit ISSUED   ? Transfusion Status OK TO TRANSFUSE   ? Unit Number [id number]   ? Blood Component Type LIQ PLASMA   ? Unit division 00   ? Status of Unit REL FROM [facility]   ? Transfusion Status OK TO TRANSFUSE   ? Unit Number [id number]   ? Blood Component Type LIQ PLASMA   ? Unit division 00   ? Status of Unit REL FROM [facility]   ? Transfusion Status OK TO TRANSFUSE   ? Unit Number [id number]   ? Blood Component Type THW PLS APHR   ? Unit division 00   ? Status of Unit REL FROM [facility]   ? Transfusion Status OK TO TRANSFUSE   ? Unit Number [id number]   ? Blood Component Type LIQ PLASMA   ? Unit division 00   ? Status of Unit REL FROM [facility]   ? Transfusion Status OK TO TRANSFUSE   ? Unit Number [id number]   ? Blood Component Type THW PLS APHR   ? Unit division A0   ? Status of Unit ISSUED   ? Transfusion Status OK TO TRANSFUSE   ? Unit Number [id number]   ? Blood Component Type THW PLS APHR   ? Unit division A0   ? Status of Unit ISSUED   ? Transfusion Status OK TO TRANSFUSE   ? Unit Number [id number]   ? Blood Component Type THW PLS APHR   ? Unit division A0   ? Status of Unit ISSUED   ? Transfusion Status OK TO TRANSFUSE   ? Unit Number [id number]   ? Blood Component Type THW PLS APHR   ? Unit division 00   ? Status of Unit ISSUED    ? Transfusion Status OK TO TRANSFUSE   ? Unit Number [id number]   ? Blood Component Type THW PLS APHR   ? Unit division A0   ? Status of Unit REL FROM [facility]   ? Transfusion Status OK TO TRANSFUSE   ? Unit Number

## 2021-08-02 ENCOUNTER — Encounter: Payer: Medicaid Other | Admitting: Student

## 2021-08-02 ENCOUNTER — Inpatient Hospital Stay (HOSPITAL_COMMUNITY): Payer: Medicaid Other

## 2021-08-02 DIAGNOSIS — R609 Edema, unspecified: Secondary | ICD-10-CM | POA: Diagnosis not present

## 2021-08-02 LAB — PREPARE FRESH FROZEN PLASMA
Unit division: 0
Unit division: 0
Unit division: 0
Unit division: 0
Unit division: 0
Unit division: 0
Unit division: 0
Unit division: 0
Unit division: 0
Unit division: 0
Unit division: 0

## 2021-08-02 LAB — POCT I-STAT 7, (LYTES, BLD GAS, ICA,H+H)
Acid-Base Excess: 0 mmol/L (ref 0.0–2.0)
Bicarbonate: 23.4 mmol/L (ref 20.0–28.0)
Calcium, Ion: 0.92 mmol/L — ABNORMAL LOW (ref 1.15–1.40)
HCT: 20 % — ABNORMAL LOW (ref 36.0–46.0)
Hemoglobin: 6.8 g/dL — CL (ref 12.0–15.0)
O2 Saturation: 99 %
Patient temperature: 99
Potassium: 4.5 mmol/L (ref 3.5–5.1)
Sodium: 139 mmol/L (ref 135–145)
TCO2: 24 mmol/L (ref 22–32)
pCO2 arterial: 31.4 mmHg — ABNORMAL LOW (ref 32–48)
pH, Arterial: 7.481 — ABNORMAL HIGH (ref 7.35–7.45)
pO2, Arterial: 126 mmHg — ABNORMAL HIGH (ref 83–108)

## 2021-08-02 LAB — PREPARE PLATELET PHERESIS
Unit division: 0
Unit division: 0
Unit division: 0

## 2021-08-02 LAB — COMPREHENSIVE METABOLIC PANEL
ALT: 217 U/L — ABNORMAL HIGH (ref 0–44)
AST: 310 U/L — ABNORMAL HIGH (ref 15–41)
Albumin: 1.9 g/dL — ABNORMAL LOW (ref 3.5–5.0)
Alkaline Phosphatase: 55 U/L (ref 38–126)
Anion gap: 12 (ref 5–15)
BUN: 19 mg/dL (ref 6–20)
CO2: 22 mmol/L (ref 22–32)
Calcium: 7.2 mg/dL — ABNORMAL LOW (ref 8.9–10.3)
Chloride: 104 mmol/L (ref 98–111)
Creatinine, Ser: 1.96 mg/dL — ABNORMAL HIGH (ref 0.44–1.00)
GFR, Estimated: 34 mL/min — ABNORMAL LOW (ref >60)
Glucose, Bld: 88 mg/dL (ref 70–99)
Potassium: 4 mmol/L (ref 3.5–5.1)
Sodium: 138 mmol/L (ref 135–145)
Total Bilirubin: 2.1 mg/dL — ABNORMAL HIGH (ref 0.3–1.2)
Total Protein: 3.7 g/dL — ABNORMAL LOW (ref 6.5–8.1)

## 2021-08-02 LAB — BPAM FFP
Blood Product Expiration Date: 202304242359
Blood Product Expiration Date: 202304242359
Blood Product Expiration Date: 202304242359
Blood Product Expiration Date: 202304242359
Blood Product Expiration Date: 202304242359
Blood Product Expiration Date: 202304242359
Blood Product Expiration Date: 202304252359
Blood Product Expiration Date: 202304252359
Blood Product Expiration Date: 202304252359
Blood Product Expiration Date: 202304252359
Blood Product Expiration Date: 202304292359
Blood Product Expiration Date: 202304292359
Blood Product Expiration Date: 202304292359
Blood Product Expiration Date: 202304292359
Blood Product Expiration Date: 202304292359
Blood Product Expiration Date: 202304292359
Blood Product Expiration Date: 202304292359
Blood Product Expiration Date: 202304292359
Blood Product Expiration Date: 202304292359
Blood Product Expiration Date: 202304292359
Blood Product Expiration Date: 202304292359
Blood Product Expiration Date: 202304292359
Blood Product Expiration Date: 202305132359
Blood Product Expiration Date: 202305152359
Blood Product Expiration Date: 202305152359
ISSUE DATE / TIME: 202304240945
ISSUE DATE / TIME: 202304241010
ISSUE DATE / TIME: 202304241010
ISSUE DATE / TIME: 202304241010
ISSUE DATE / TIME: 202304241010
ISSUE DATE / TIME: 202304241014
ISSUE DATE / TIME: 202304241014
ISSUE DATE / TIME: 202304241014
ISSUE DATE / TIME: 202304241014
ISSUE DATE / TIME: 202304241026
ISSUE DATE / TIME: 202304241026
ISSUE DATE / TIME: 202304241026
ISSUE DATE / TIME: 202304241026
ISSUE DATE / TIME: 202304241042
ISSUE DATE / TIME: 202304241042
ISSUE DATE / TIME: 202304241042
ISSUE DATE / TIME: 202304241042
ISSUE DATE / TIME: 202304241048
ISSUE DATE / TIME: 202304241048
ISSUE DATE / TIME: 202304241048
ISSUE DATE / TIME: 202304241111
ISSUE DATE / TIME: 202304241111
ISSUE DATE / TIME: 202304241111
ISSUE DATE / TIME: 202304241111
ISSUE DATE / TIME: 202304242002
Unit Type and Rh: 5100
Unit Type and Rh: 5100
Unit Type and Rh: 5100
Unit Type and Rh: 5100
Unit Type and Rh: 5100
Unit Type and Rh: 5100
Unit Type and Rh: 5100
Unit Type and Rh: 5100
Unit Type and Rh: 5100
Unit Type and Rh: 5100
Unit Type and Rh: 5100
Unit Type and Rh: 6200
Unit Type and Rh: 6200
Unit Type and Rh: 6200
Unit Type and Rh: 6200
Unit Type and Rh: 6200
Unit Type and Rh: 6200
Unit Type and Rh: 6200
Unit Type and Rh: 6200
Unit Type and Rh: 6200
Unit Type and Rh: 6200
Unit Type and Rh: 6200
Unit Type and Rh: 6200
Unit Type and Rh: 6200
Unit Type and Rh: 9500

## 2021-08-02 LAB — PREPARE CRYOPRECIPITATE
Unit division: 0
Unit division: 0
Unit division: 0
Unit division: 0
Unit division: 0
Unit division: 0
Unit division: 0
Unit division: 0

## 2021-08-02 LAB — DIC (DISSEMINATED INTRAVASCULAR COAGULATION)PANEL
D-Dimer, Quant: >20 ug/mL-FEU — ABNORMAL HIGH (ref 0.00–0.50)
D-Dimer, Quant: >20 ug/mL-FEU — ABNORMAL HIGH (ref 0.00–0.50)
D-Dimer, Quant: >20 ug/mL-FEU — ABNORMAL HIGH (ref 0.00–0.50)
Fibrinogen: 292 mg/dL (ref 210–475)
Fibrinogen: 397 mg/dL (ref 210–475)
Fibrinogen: 463 mg/dL (ref 210–475)
INR: 1.6 — ABNORMAL HIGH (ref 0.8–1.2)
INR: 1.7 — ABNORMAL HIGH (ref 0.8–1.2)
INR: 1.4 — ABNORMAL HIGH (ref 0.8–1.2)
Platelets: 126 10*3/uL — ABNORMAL LOW (ref 150–400)
Platelets: 131 10*3/uL — ABNORMAL LOW (ref 150–400)
Platelets: 108 10*3/uL — ABNORMAL LOW (ref 150–400)
Prothrombin Time: 19 seconds — ABNORMAL HIGH (ref 11.4–15.2)
Prothrombin Time: 19.8 seconds — ABNORMAL HIGH (ref 11.4–15.2)
Prothrombin Time: 17.1 seconds — ABNORMAL HIGH (ref 11.4–15.2)
Smear Review: NONE SEEN
Smear Review: NONE SEEN
aPTT: 38 seconds — ABNORMAL HIGH (ref 24–36)
aPTT: 35 seconds (ref 24–36)
aPTT: 32 seconds (ref 24–36)

## 2021-08-02 LAB — MAGNESIUM
Magnesium: 1.3 mg/dL — ABNORMAL LOW (ref 1.7–2.4)
Magnesium: 2.8 mg/dL — ABNORMAL HIGH (ref 1.7–2.4)
Magnesium: 2.8 mg/dL — ABNORMAL HIGH (ref 1.7–2.4)

## 2021-08-02 LAB — BPAM CRYOPRECIPITATE
Blood Product Expiration Date: 202304241542
Blood Product Expiration Date: 202304241635
Blood Product Expiration Date: 202304241655
Blood Product Expiration Date: 202304241722
Blood Product Expiration Date: 202304241735
Blood Product Expiration Date: 202304241735
Blood Product Expiration Date: 202304241911
Blood Product Expiration Date: 202304241911
ISSUE DATE / TIME: 202304241006
ISSUE DATE / TIME: 202304241052
ISSUE DATE / TIME: 202304241104
ISSUE DATE / TIME: 202304241135
ISSUE DATE / TIME: 202304241149
ISSUE DATE / TIME: 202304241149
ISSUE DATE / TIME: 202304241329
ISSUE DATE / TIME: 202304241329
Unit Type and Rh: 5100
Unit Type and Rh: 5100
Unit Type and Rh: 5100
Unit Type and Rh: 5100
Unit Type and Rh: 5100
Unit Type and Rh: 5100
Unit Type and Rh: 6200
Unit Type and Rh: 6200

## 2021-08-02 LAB — CBC WITH DIFFERENTIAL/PLATELET
Abs Immature Granulocytes: 0.12 10*3/uL — ABNORMAL HIGH (ref 0.00–0.07)
Basophils Absolute: 0 10*3/uL (ref 0.0–0.1)
Basophils Relative: 0 %
Eosinophils Absolute: 0 10*3/uL (ref 0.0–0.5)
Eosinophils Relative: 0 %
HCT: 20 % — ABNORMAL LOW (ref 36.0–46.0)
Hemoglobin: 7.4 g/dL — ABNORMAL LOW (ref 12.0–15.0)
Immature Granulocytes: 1 %
Lymphocytes Relative: 8 %
Lymphs Abs: 1.6 10*3/uL (ref 0.7–4.0)
MCH: 30.6 pg (ref 26.0–34.0)
MCHC: 37 g/dL — ABNORMAL HIGH (ref 30.0–36.0)
MCV: 82.6 fL (ref 80.0–100.0)
Monocytes Absolute: 1.1 10*3/uL — ABNORMAL HIGH (ref 0.1–1.0)
Monocytes Relative: 6 %
Neutro Abs: 16.8 10*3/uL — ABNORMAL HIGH (ref 1.7–7.7)
Neutrophils Relative %: 85 %
Platelets: 124 10*3/uL — ABNORMAL LOW (ref 150–400)
RBC: 2.42 MIL/uL — ABNORMAL LOW (ref 3.87–5.11)
RDW: 15.7 % — ABNORMAL HIGH (ref 11.5–15.5)
WBC: 19.6 10*3/uL — ABNORMAL HIGH (ref 4.0–10.5)
nRBC: 1.4 % — ABNORMAL HIGH (ref 0.0–0.2)

## 2021-08-02 LAB — BASIC METABOLIC PANEL
Anion gap: 16 — ABNORMAL HIGH (ref 5–15)
BUN: 30 mg/dL — ABNORMAL HIGH (ref 6–20)
CO2: 20 mmol/L — ABNORMAL LOW (ref 22–32)
Calcium: 7.5 mg/dL — ABNORMAL LOW (ref 8.9–10.3)
Chloride: 106 mmol/L (ref 98–111)
Creatinine, Ser: 3.45 mg/dL — ABNORMAL HIGH (ref 0.44–1.00)
GFR, Estimated: 17 mL/min — ABNORMAL LOW (ref >60)
Glucose, Bld: 108 mg/dL — ABNORMAL HIGH (ref 70–99)
Potassium: 4.6 mmol/L (ref 3.5–5.1)
Sodium: 142 mmol/L (ref 135–145)

## 2021-08-02 LAB — CBC
HCT: 23.6 % — ABNORMAL LOW (ref 36.0–46.0)
HCT: 21 % — ABNORMAL LOW (ref 36.0–46.0)
HCT: 20.8 % — ABNORMAL LOW (ref 36.0–46.0)
Hemoglobin: 8.6 g/dL — ABNORMAL LOW (ref 12.0–15.0)
Hemoglobin: 7.5 g/dL — ABNORMAL LOW (ref 12.0–15.0)
Hemoglobin: 7.2 g/dL — ABNORMAL LOW (ref 12.0–15.0)
MCH: 30.4 pg (ref 26.0–34.0)
MCH: 30.9 pg (ref 26.0–34.0)
MCH: 30.1 pg (ref 26.0–34.0)
MCHC: 36.4 g/dL — ABNORMAL HIGH (ref 30.0–36.0)
MCHC: 35.7 g/dL (ref 30.0–36.0)
MCHC: 34.6 g/dL (ref 30.0–36.0)
MCV: 83.4 fL (ref 80.0–100.0)
MCV: 86.4 fL (ref 80.0–100.0)
MCV: 87 fL (ref 80.0–100.0)
Platelets: 122 10*3/uL — ABNORMAL LOW (ref 150–400)
Platelets: 106 10*3/uL — ABNORMAL LOW (ref 150–400)
Platelets: 109 10*3/uL — ABNORMAL LOW (ref 150–400)
RBC: 2.83 MIL/uL — ABNORMAL LOW (ref 3.87–5.11)
RBC: 2.43 MIL/uL — ABNORMAL LOW (ref 3.87–5.11)
RBC: 2.39 MIL/uL — ABNORMAL LOW (ref 3.87–5.11)
RDW: 15.3 % (ref 11.5–15.5)
RDW: 15.8 % — ABNORMAL HIGH (ref 11.5–15.5)
RDW: 15.9 % — ABNORMAL HIGH (ref 11.5–15.5)
WBC: 15 10*3/uL — ABNORMAL HIGH (ref 4.0–10.5)
WBC: 19.1 10*3/uL — ABNORMAL HIGH (ref 4.0–10.5)
WBC: 18.8 10*3/uL — ABNORMAL HIGH (ref 4.0–10.5)
nRBC: 2.3 % — ABNORMAL HIGH (ref 0.0–0.2)
nRBC: 1 % — ABNORMAL HIGH (ref 0.0–0.2)
nRBC: 1.1 % — ABNORMAL HIGH (ref 0.0–0.2)

## 2021-08-02 LAB — VANCOMYCIN, RANDOM: Vancomycin Rm: <4

## 2021-08-02 LAB — GLUCOSE, CAPILLARY
Glucose-Capillary: 105 mg/dL — ABNORMAL HIGH (ref 70–99)
Glucose-Capillary: 136 mg/dL — ABNORMAL HIGH (ref 70–99)
Glucose-Capillary: 152 mg/dL — ABNORMAL HIGH (ref 70–99)
Glucose-Capillary: 81 mg/dL (ref 70–99)
Glucose-Capillary: 82 mg/dL (ref 70–99)
Glucose-Capillary: 89 mg/dL (ref 70–99)

## 2021-08-02 LAB — BPAM PLATELET PHERESIS
Blood Product Expiration Date: 202304242359
Blood Product Expiration Date: 202304252359
Blood Product Expiration Date: 202304272359
ISSUE DATE / TIME: 202304241016
ISSUE DATE / TIME: 202304241050
ISSUE DATE / TIME: 202304241137
Unit Type and Rh: 600
Unit Type and Rh: 6200
Unit Type and Rh: 6200

## 2021-08-02 LAB — PHOSPHORUS
Phosphorus: 7.2 mg/dL — ABNORMAL HIGH (ref 2.5–4.6)
Phosphorus: 8.9 mg/dL — ABNORMAL HIGH (ref 2.5–4.6)
Phosphorus: 10 mg/dL — ABNORMAL HIGH (ref 2.5–4.6)

## 2021-08-02 LAB — TRIGLYCERIDES: Triglycerides: 524 mg/dL — ABNORMAL HIGH (ref <150)

## 2021-08-02 LAB — PREPARE RBC (CROSSMATCH)

## 2021-08-02 MED ORDER — PHENYLEPHRINE HCL-NACL 20-0.9 MG/250ML-% IV SOLN
0.0000 ug/min | INTRAVENOUS | Status: DC
  Administered 2021-08-02: 125 ug/min via INTRAVENOUS

## 2021-08-02 MED ORDER — ZOLPIDEM TARTRATE 5 MG PO TABS
5.0000 mg | ORAL_TABLET | Freq: Every evening | ORAL | Status: DC | PRN
Start: 2021-08-02 — End: 2021-08-03

## 2021-08-02 MED ORDER — CHLORHEXIDINE GLUCONATE 0.12% ORAL RINSE (MEDLINE KIT)
15.0000 mL | Freq: Two times a day (BID) | OROMUCOSAL | Status: DC
  Administered 2021-08-02 – 2021-08-03 (×4): 15 mL via OROMUCOSAL

## 2021-08-02 MED ORDER — FENTANYL 2500MCG IN NS 250ML (10MCG/ML) PREMIX INFUSION
0.0000 ug/h | INTRAVENOUS | Status: DC
  Administered 2021-08-02: 250 ug/h via INTRAVENOUS
  Administered 2021-08-02: 50 ug/h via INTRAVENOUS
  Administered 2021-08-02 – 2021-08-03 (×2): 250 ug/h via INTRAVENOUS
  Filled 2021-08-02 (×4): qty 250

## 2021-08-02 MED ORDER — LIP MEDEX EX OINT
TOPICAL_OINTMENT | CUTANEOUS | Status: DC | PRN
  Filled 2021-08-02: qty 7

## 2021-08-02 MED ORDER — CALCIUM GLUCONATE-NACL 2-0.675 GM/100ML-% IV SOLN
2.0000 g | Freq: Once | INTRAVENOUS | Status: AC
  Administered 2021-08-02: 2000 mg via INTRAVENOUS
  Filled 2021-08-02: qty 100

## 2021-08-02 MED ORDER — ONDANSETRON HCL 4 MG/2ML IJ SOLN: 4.0000 mg | INTRAMUSCULAR | Status: DC | PRN

## 2021-08-02 MED ORDER — SODIUM CHLORIDE 0.9% IV SOLUTION: Freq: Once | INTRAVENOUS | Status: AC

## 2021-08-02 MED ORDER — VITAL HIGH PROTEIN PO LIQD: 1000.0000 mL | ORAL | Status: DC

## 2021-08-02 MED ORDER — MAGNESIUM SULFATE 2 GM/50ML IV SOLN
2.0000 g | Freq: Once | INTRAVENOUS | Status: AC
  Administered 2021-08-02: 2 g via INTRAVENOUS
  Filled 2021-08-02: qty 50

## 2021-08-02 MED ORDER — CALCIUM GLUCONATE-NACL 1-0.675 GM/50ML-% IV SOLN
1.0000 g | Freq: Once | INTRAVENOUS | Status: AC
  Administered 2021-08-02: 1000 mg via INTRAVENOUS
  Filled 2021-08-02: qty 50

## 2021-08-02 MED ORDER — PIVOT 1.5 CAL PO LIQD
1000.0000 mL | ORAL | Status: DC
  Filled 2021-08-02 (×3): qty 1000

## 2021-08-02 MED ORDER — MAGNESIUM SULFATE 4 GM/100ML IV SOLN
4.0000 g | Freq: Once | INTRAVENOUS | Status: AC
  Administered 2021-08-02: 4 g via INTRAVENOUS
  Filled 2021-08-02: qty 100

## 2021-08-02 MED ORDER — ORAL CARE MOUTH RINSE
15.0000 mL | OROMUCOSAL | Status: DC
  Administered 2021-08-02 – 2021-08-03 (×17): 15 mL via OROMUCOSAL

## 2021-08-02 MED ORDER — VASOPRESSIN 20 UNITS/100 ML INFUSION FOR SHOCK
0.0400 [IU]/min | INTRAVENOUS | Status: DC
Start: 2021-08-02 — End: 2021-08-03
  Administered 2021-08-02 (×3): 0.04 [IU]/min via INTRAVENOUS
  Filled 2021-08-02 (×3): qty 100

## 2021-08-02 MED ORDER — SODIUM CHLORIDE 0.9% IV SOLUTION: Freq: Once | INTRAVENOUS | Status: DC

## 2021-08-02 MED ORDER — NOREPINEPHRINE 16 MG/250ML-% IV SOLN
0.0000 ug/min | INTRAVENOUS | Status: DC
  Administered 2021-08-02: 2 ug/min via INTRAVENOUS
  Filled 2021-08-02: qty 250

## 2021-08-02 MED ORDER — MIDAZOLAM-SODIUM CHLORIDE 100-0.9 MG/100ML-% IV SOLN
0.0000 mg/h | INTRAVENOUS | Status: DC
  Administered 2021-08-02: 0.5 mg/h via INTRAVENOUS
  Filled 2021-08-02: qty 100

## 2021-08-02 MED ORDER — PHENYLEPHRINE CONCENTRATED 100MG/250ML (0.4 MG/ML) INFUSION SIMPLE
0.0000 ug/min | INTRAVENOUS | Status: DC
  Administered 2021-08-02: 50 ug/min via INTRAVENOUS
  Filled 2021-08-02: qty 250

## 2021-08-02 MED ORDER — MIDAZOLAM BOLUS VIA INFUSION
1.0000 mg | INTRAVENOUS | Status: DC | PRN
  Filled 2021-08-02: qty 1

## 2021-08-02 MED ORDER — PANTOPRAZOLE SODIUM 40 MG IV SOLR
40.0000 mg | INTRAVENOUS | Status: DC
  Administered 2021-08-02: 40 mg via INTRAVENOUS
  Filled 2021-08-02: qty 10

## 2021-08-02 MED ORDER — DEXMEDETOMIDINE HCL IN NACL 400 MCG/100ML IV SOLN
0.4000 ug/kg/h | INTRAVENOUS | Status: DC
  Administered 2021-08-02: 1 ug/kg/h via INTRAVENOUS
  Administered 2021-08-02: 0.6 ug/kg/h via INTRAVENOUS
  Filled 2021-08-02 (×2): qty 100

## 2021-08-02 MED ORDER — SIMETHICONE 80 MG PO CHEW: 80.0000 mg | CHEWABLE_TABLET | ORAL | Status: DC | PRN

## 2021-08-02 MED ORDER — FENTANYL BOLUS VIA INFUSION
50.0000 ug | INTRAVENOUS | Status: DC | PRN
  Filled 2021-08-02: qty 50

## 2021-08-02 MED ORDER — DIPHENHYDRAMINE HCL 12.5 MG/5ML PO ELIX: 25.0000 mg | ORAL_SOLUTION | Freq: Four times a day (QID) | ORAL | Status: DC | PRN

## 2021-08-02 MED ORDER — PROSOURCE TF PO LIQD: 45.0000 mL | Freq: Two times a day (BID) | ORAL | Status: DC

## 2021-08-02 MED ORDER — VASOPRESSIN 20 UNITS/100 ML INFUSION FOR SHOCK
INTRAVENOUS | Status: AC
  Filled 2021-08-02: qty 100

## 2021-08-02 MED ORDER — ONDANSETRON HCL 4 MG PO TABS
4.0000 mg | ORAL_TABLET | ORAL | Status: DC | PRN
Start: 2021-08-02 — End: 2021-08-03
  Filled 2021-08-02: qty 1

## 2021-08-02 NOTE — Anesthesia Preprocedure Evaluation (Signed)
Anesthesia Evaluation  ?Patient identified by MRN, date of birth, ID band ?Patient unresponsive ? ? ? ?Reviewed: ?Allergy & Precautions, NPO status , Patient's Chart, lab work & pertinent test results ? ?Airway ?Mallampati: Intubated ? ? ? ? ? ? Dental ? ?(+) Caps ?  ?Pulmonary ? ?Intubated ?  ?Pulmonary exam normal ?breath sounds clear to auscultation ? ? ? ? ? ? Cardiovascular ? ?Rhythm:Regular Rate:Tachycardia ? ?Hemorrhagic shock ?  ?Neuro/Psych ?Unresponsive- patient under GETA ?negative psych ROS  ? GI/Hepatic ?negative GI ROS, Elevated LFT's ?  ?Endo/Other  ?Obesity ? Renal/GU ?Hematuria ?Oliguria ?  ?negative genitourinary ?  ?Musculoskeletal ?negative musculoskeletal ROS ?(+)  ? Abdominal ?(+) + obese,   ?Peds ?negative pediatric ROS ?(+)  Hematology ? ?(+) Blood dyscrasia, anemia , Patient in DIC   ?Anesthesia Other Findings ? ? Reproductive/Obstetrics ?Post Partum hemorrhage requiring hysterectomy and oophorectomy and massive resuscitation.  ? ?  ? ? ? ? ? ? ? ? ? ? ? ? ? ?  ?  ? ? ? ? ? ? ? ? ?Anesthesia Physical ? ?Anesthesia Plan ? ?ASA: 3 and emergent ? ?Anesthesia Plan: General  ? ?Post-op Pain Management:   ? ?Induction:  ? ?PONV Risk Score and Plan: 2 and 4 or greater and Treatment may vary due to age or medical condition, Midazolam and Ondansetron ? ?Airway Management Planned:  ? ?Additional Equipment: None and CVP ? ?Intra-op Plan:  ? ?Post-operative Plan: Post-operative intubation/ventilation ? ?Informed Consent:  ? ? ? ?History available from chart only and Only emergency history available ? ?Plan Discussed with: CRNA and Anesthesiologist ? ?Anesthesia Plan Comments:   ? ? ? ? ? ? ?Anesthesia Quick Evaluation ? ?

## 2021-08-02 NOTE — Progress Notes (Signed)
POD1 ? ?Subjective: ?intubated ? ?Objective: ?Blood pressure (!) 76/64, pulse 92, temperature 99.4 ?F (37.4 ?C), temperature source Oral, resp. rate (!) 22, height '5\' 5"'$  (1.651 m), weight 92.1 kg, last menstrual period 10/25/2020, SpO2 99 %, unknown if currently breastfeeding. ? ?Physical Exam:  ?General: stable, intubated ?CV: regular rate ?Pulm: ventilated ?Incision: no new drainage, incision intact ?GU: Vaginal packing noted to have blood staining, pad placed at about 2100 and 50% of the pad with blood on it over the course of 10 hours.  ? ? ?  Latest Ref Rng & Units 08/02/2021  ?  1:35 AM 08/01/2021  ?  9:06 PM 08/01/2021  ?  6:25 PM  ?CBC  ?WBC 4.0 - 10.5 K/uL 15.0   12.5     ?Hemoglobin 12.0 - 15.0 g/dL 8.6   9.6   7.5    ?Hematocrit 36.0 - 46.0 % 23.6   25.6   22.0    ?Platelets 150 - 400 K/uL 122    ? 126   103     ? ? ?  Latest Ref Rng & Units 08/02/2021  ?  1:35 AM 08/01/2021  ?  6:25 PM 08/01/2021  ?  3:55 PM  ?CMP  ?Glucose 70 - 99 mg/dL 88      ?BUN 6 - 20 mg/dL 19      ?Creatinine 0.44 - 1.00 mg/dL 1.96      ?Sodium 135 - 145 mmol/L 138   143   143    ?Potassium 3.5 - 5.1 mmol/L 4.0   3.1   3.5    ?Chloride 98 - 111 mmol/L 104      ?CO2 22 - 32 mmol/L 22      ?Calcium 8.9 - 10.3 mg/dL 7.2      ?Total Protein 6.5 - 8.1 g/dL 3.7      ?Total Bilirubin 0.3 - 1.2 mg/dL 2.1      ?Alkaline Phos 38 - 126 U/L 55      ?AST 15 - 41 U/L 310      ?ALT 0 - 44 U/L 217      ? ?Lactic Acid, Venous ?   ?Component Value Date/Time  ? LATICACIDVEN 2.6 ([place]) 08/01/2021 2135  ? ?BNP ?   ?Component Value Date/Time  ? BNP 209.6 (H) 08/01/2021 2106  ? ? ? ? ?Assessment/Plan: ?POD#1 s/p SVD complicated by PPH/DIC s/p D&C followed by Supracervical hysterectomy, LSO, cystoscopy and vaginal packing following by VIR consult and placement of gelfoam/coil ?Neuro - sedated with precedex and fentanyl  ?CV - neo drip, tachycardia resolved. A-line and central line present. ?Pulm - ventilated per ICU protocol/recommendations. FiO2 down to 30%.   ?GI - NPO. LFTs will be trended - injury likely due to shock.  ?GU - continue pad counts, Continue foley - minimal UOP that is bloody ?Heme - INR stable at 1.6. Hemoglobin at 8.6 which is stbale. May still need additional blood products.  ?ID - on zosyn and vancomycin. Patient pan-cultured. ?Nephro - AKI due to ATN  ?DVT proph - SCDs ? ? ? LOS: 2 days  ? ?Radene Gunning ?08/02/2021, 7:44 AM  ? ? ?

## 2021-08-02 NOTE — Transfer of Care (Signed)
Immediate Anesthesia Transfer of Care Note ? ?Patient: Debra Bernard ? ?Procedure(s) Performed: DILATATION AND CURETTAGE ?HYSTERECTOMY ABDOMINAL ?CYSTOSCOPY ? ?Patient Location: PACU and Radiology ? ?Anesthesia Type:General ? ?Level of Consciousness: Patient remains intubated per anesthesia plan ? ?Airway & Oxygen Therapy: Patient remains intubated per anesthesia plan and Patient placed on Ventilator (see vital sign flow sheet for setting) ? ?Post-op Assessment: Post -op Vital signs reviewed and unstable, Anesthesiologist notified ? ?Post vital signs: unstable ? ?Last Vitals:  ?Vitals Value Taken Time  ?BP 118/79 08/02/21 0738  ?Temp 37.4 ?C 08/02/21 0733  ?Pulse 103 08/02/21 0935  ?Resp 22 08/02/21 0935  ?SpO2 97 % 08/02/21 0935  ?Vitals shown include unvalidated device data. ? ?Transported to IR for further care   ? ?  ? ?Complications: No notable events documented. ?

## 2021-08-02 NOTE — Progress Notes (Addendum)
eLink Physician-Brief Progress Note ?Patient Name: Debra Bernard ?DOB: 03/06/1986 ?MRN: [id number] ? ? ?Date of Service ? 08/02/2021  ?HPI/Events of Note ? Triglycerides 524, patient is on Propofol.  ?eICU Interventions ? Propofol gtt discontinued and Precedex gtt substituted. Bilateral wrist restraints ordered to prevent self-extubation.  ? ? ? ?  ? ?Kerry Kass Charmagne Buhl ?08/02/2021, 2:54 AM ?

## 2021-08-02 NOTE — Anesthesia Postprocedure Evaluation (Signed)
Anesthesia Post Note ? ?Patient: Debra Bernard ? ?Procedure(s) Performed: DILATATION AND CURETTAGE ?HYSTERECTOMY ABDOMINAL ?CYSTOSCOPY ? ?  ? ?Patient location during evaluation: Radiology ?Anesthesia Type: General ?Level of consciousness: patient remains intubated per anesthesia plan ?Vital Signs Assessment: vitals unstable ?Respiratory status: respiratory function unstable and patient on ventilator - see flowsheet for VS ?Cardiovascular status: unstable ?Anesthetic complications: no ?Comments: Patient had post partum hemorrhage and massive transfusion requiring extensive resuscitation in the OR. She continued to hemorrhage in OR due to fulminant DIC and surgical bleeding which required transport to IR for further treatment. She was transported to IR intubated under GETA with full monitors. See IR anesthesia record for further details. ? ? ?No notable events documented. ? ?Last Vitals:  ?Vitals:  ? 08/02/21 0733 08/02/21 0745  ?BP:    ?Pulse:    ?Resp:    ?Temp: 37.4 ?C   ?SpO2:  99%  ?  ?Last Pain:  ?Vitals:  ? 08/02/21 0733  ?TempSrc: Oral  ?PainSc:   ? ? ?  ?  ?  ?  ?  ?  ? ?Debra Bernard A. ? ? ? ? ?

## 2021-08-02 NOTE — Anesthesia Postprocedure Evaluation (Signed)
Anesthesia Post Note ? ?Patient: Debra Bernard ? ?Procedure(s) Performed: AN AD HOC LABOR EPIDURAL ? ?  ? ?Patient location during evaluation: [facility] Unit ?Anesthesia Type: Epidural ?Level of consciousness: awake ?Pain management: pain level controlled ?Vital Signs Assessment: vitals unstable ?Respiratory status: spontaneous breathing and nonlabored ventilation ?Cardiovascular status: unstable ?Postop Assessment: no headache, no backache and epidural receding ?Anesthetic complications: no ?Comments: Patient had Post Partum Hemorhage after delivery requiring operative intervention. ? ? ?No notable events documented. ? ?Last Vitals:  ?Vitals:  ? 08/02/21 0733 08/02/21 0745  ?BP:    ?Pulse:    ?Resp:    ?Temp: 37.4 ?C   ?SpO2:  99%  ?  ?Last Pain:  ?Vitals:  ? 08/02/21 0733  ?TempSrc: Oral  ?PainSc:   ? ? ?  ?  ?  ?  ?  ?  ? ?Charlena Haub A. ? ? ? ? ?

## 2021-08-02 NOTE — Addendum Note (Signed)
Addendum  created 08/02/21 1204 by Josephine Igo, MD  ? Clinical Note Signed  ?  ?

## 2021-08-02 NOTE — Progress Notes (Signed)
Epidural catheter remains in place. Coag results from this am noted. Would like to get INR to 1.5 or below before pulling epidural catheter. Platelets and fibrinogen are adequate for removal. Will recheck in the am. ?

## 2021-08-02 NOTE — Progress Notes (Signed)
POD1 ? ?Subjective: ?intubated ? ?Objective: ?Blood pressure 96/81, pulse (!) 150, temperature 98.3 ?F (36.8 ?C), temperature source Oral, resp. rate (!) 22, height '5\' 5"'$  (1.651 m), weight 90.3 kg, last menstrual period 10/25/2020, SpO2 98 %, unknown if currently breastfeeding. ? ?Physical Exam:  ?General: stable, intubated ?CV: tachycardic ?Pulm: ventilated ?Incision: no new drainage, incision intact ?GU: Vaginal packing noted to have blood staining, pad placed at about 2100 and no new blood staining at all ? ? ?  Latest Ref Rng & Units 08/01/2021  ?  9:06 PM 08/01/2021  ?  6:25 PM 08/01/2021  ?  3:55 PM  ?CBC  ?WBC 4.0 - 10.5 K/uL 12.5      ?Hemoglobin 12.0 - 15.0 g/dL 9.6   7.5   9.2    ?Hematocrit 36.0 - 46.0 % 25.6   22.0   27.0    ?Platelets 150 - 400 K/uL 103      ? ? ?  Latest Ref Rng & Units 08/01/2021  ?  6:25 PM 08/01/2021  ?  3:55 PM 08/01/2021  ?  2:46 PM  ?CMP  ?Glucose 70 - 99 mg/dL   129    ?BUN 6 - 20 mg/dL   10    ?Creatinine 0.44 - 1.00 mg/dL   1.54    ?Sodium 135 - 145 mmol/L 143   143   142    ?Potassium 3.5 - 5.1 mmol/L 3.1   3.5   4.1    ?Chloride 98 - 111 mmol/L   105    ?CO2 22 - 32 mmol/L   18    ?Calcium 8.9 - 10.3 mg/dL   9.1    ?Total Protein 6.5 - 8.1 g/dL   3.6    ?Total Bilirubin 0.3 - 1.2 mg/dL   2.1    ?Alkaline Phos 38 - 126 U/L   48    ?AST 15 - 41 U/L   100    ?ALT 0 - 44 U/L   80    ? ?Lactic Acid, Venous ?   ?Component Value Date/Time  ? LATICACIDVEN 2.6 ([place]) 08/01/2021 2135  ? ?BNP ?   ?Component Value Date/Time  ? BNP 209.6 (H) 08/01/2021 2106  ? ? ? ? ?Assessment/Plan: ?POD#1 s/p SVD complicated by PPH/DIC s/p D&C followed by Supracervical hysterectomy, LSO, cystoscopy and vaginal packing following by VIR consult and placement of gelfoam/coil ?Neuro - sedated with propofol and fentanyl added for possible postop pain contributing to HTN and tachycardia ?CV - tachycardic but still equilibrating. She is off neo drip except for prn. A-line and central line present. ?Pulm - ventilated  per ICU protocol/recommendations ?GI - NPO. LFTs will be trended - injury likely due to shock.  ?GU - continue pad counts, nurse overnight [place] Ar) will notify me if soaking pads (this would be a significant change). Continue foley - minimal UOP ?Heme - DIC seems to be improving after multiple blood products. Coags to be repeated later this AM. Plts stable.  Hemoglobin likely hemo-concentrated but also then likely stable and thus consistent with her clinical picture. May still need additional blood products.  ?ID - on zosyn and vancomycin. Patient pan-cultured. ?Nephro - AKI due to ATN  ?DVT proph - SCDs ? ? ? LOS: 2 days  ? ?Debra Bernard ?08/02/2021, 1:33 AM  ? ? ?

## 2021-08-02 NOTE — Progress Notes (Signed)
eLink Physician-Brief Progress Note ?Patient Name: Debra Bernard ?DOB: [birth year] ?MRN: [id number] ? ? ?Date of Service ? 08/02/2021  ?HPI/Events of Note ? Bedside RN requesting that order for Protonix via enteral tube be changed to iv Protonix.  ?eICU Interventions ? Order changed.  ? ? ? ?  ? ?Kerry Kass Christeen Lai ?08/02/2021, 10:01 PM ?

## 2021-08-02 NOTE — Progress Notes (Signed)
? ? ?Referring Physician(s): ?Dr. Ilda Basset ? ?Supervising Physician: Michaelle Birks ? ?Patient Status:  [facility] - In-pt ? ?Chief Complaint: ? ?Post partum hemorrhage s/p pelvic arteriogram with gel foam embolization of the bilateral internal iliac arteries by Dr. Owens Shark on 4.24.23. ? ?Subjective: ? ?Unable to assess due to patient condition. ? ?Allergies: ?Patient has no known allergies. ? ?Medications: ?Prior to Admission medications   ?Medication Sig Start Date End Date Taking? Authorizing Provider  ?Prenatal Vit-Fe Fumarate-FA (PREPLUS) 27-1 MG TABS Take 1 tablet by mouth daily. 03/10/21  Yes Chancy Milroy, MD  ?Blood Pressure Monitoring (BLOOD PRESSURE KIT) DEVI 1 Device by Does not apply route as needed. 02/10/21   Renee Harder, CNM  ?cyclobenzaprine (FLEXERIL) 5 MG tablet Take 1 tablet (5 mg total) by mouth 3 (three) times daily as needed for muscle spasms. 07/05/21   Luvenia Redden, PA-C  ?Doxylamine-Pyridoxine (DICLEGIS) 10-10 MG TBEC Take 1 tablet by mouth in the morning, at noon, and at bedtime. 01/13/21   Starr Lake, CNM  ?famotidine (PEPCID) 40 MG tablet Take 1 tablet (40 mg total) by mouth daily. 06/21/21   Clarnce Flock, MD  ?hydrocortisone (ANUSOL-HC) 2.5 % rectal cream Place rectally 2 (two) times daily. 07/06/21   Anyanwu, Sallyanne Havers, MD  ?miconazole (MONISTAT 7) 2 % vaginal cream Place 1 Applicatorful vaginally at bedtime. Apply for seven nights 07/22/21   [name], MD  ?polyethylene glycol powder (GLYCOLAX/MIRALAX) 17 GM/SCOOP powder Take 17 g by mouth daily as needed. 06/21/21   Clarnce Flock, MD  ? ? ? ?Vital Signs: ?BP (!) 123/53 Comment: ALINE  Pulse 90   Temp 99 ?F (37.2 ?C) (Oral)   Resp 18   Ht 5' 5"  (1.651 m)   Wt 203 lb 0.7 oz (92.1 kg)   LMP 10/25/2020   SpO2 97%   Breastfeeding Unknown   BMI 33.79 kg/m?  ? ?Physical Exam ?Constitutional:   ?   Appearance: She is ill-appearing.  ?Cardiovascular:  ?   Rate and Rhythm: Normal rate and regular rhythm.   ?Pulmonary:  ?   Comments: On vent sedated ?Abdominal:  ?   General: There is distension.  ?Genitourinary: ?   Comments: Foley catheter with blood noted to be in the gravity bag. ? ? ?Imaging: ?IR Angiogram Selective Each Additional Vessel ? ?Result Date: 08/02/2021 ?INDICATION: Postpartum hemorrhage. Patient now presents for pelvic arteriogram and potential embolization. EXAM: 1. ULTRASOUND GUIDANCE FOR ARTERIAL ACCESS 2. FLUSH PELVIC ARTERIOGRAM 3. SELECTIVE RIGHT INTERNAL ILIAC ARTERIOGRAM AND FLUOROSCOPIC GUIDED GEL-FOAM EMBOLIZATION 4. SELECTIVE RIGHT 5. SELECTIVE LEFT INTERNAL ILIAC ARTERIOGRAM AND FLUOROSCOPIC GUIDED GEL-FOAM EMBOLIZATION MEDICATIONS: Patient admitted to the hospital receiving intravenous antibiotics. The antibiotic was administered within one hour of the procedure ANESTHESIA/SEDATION: Patient intubated and sedated by the anesthesia team. CONTRAST:  110 cc Omnipaque 300 FLUOROSCOPY TIME:  27 minutes, 48 seconds (0,272 mGy) COMPLICATIONS: None immediate. PROCEDURE: Emergency consent was obtained prior to the initiation of the procedure. A time out was performed prior to the initiation of the procedure. Maximal barrier sterile technique utilized including caps, mask, sterile gowns, sterile gloves, large sterile drape, hand hygiene, and standard prep. The right femoral head was marked fluoroscopically. Under sterile conditions and local anesthesia, the right common femoral artery access was performed with a micropuncture needle. Under direct ultrasound guidance, the right common femoral was accessed with a micropuncture kit. An ultrasound image was saved for documentation purposes. This allowed for placement of a 5-French vascular sheath. A  limited arteriogram was performed through the side arm of the sheath confirming appropriate access within the right common femoral artery. Over a Bentson wire, an Omni flush catheter was advanced to the level of the aortic bifurcation and a flush pelvic  arteriogram was performed. Next, over a Bentson wire, the Omni flush catheter was exchanged for a C2 catheter which was utilized to select the ipsilateral right internal iliac artery and a selective right internal iliac arteriogram was performed. With the use of a fathom 14 microwire, a STC Renegade microcatheter was advanced into the right uterine artery and a selective right uterine arteriogram was performed. The microcatheter was advanced to the horizontal segment of the vessel however could not be advanced into the vertical segment of the vessel secondary to tortuosity and spasm. Next, the residual right uterine artery was percutaneously coil embolized with multiple overlapping 2 mm, 3 mm and 4 mm fibered and 9 fibered interlock coils. Multiple selective arteriograms performed during coil embolization. Ultimately, the microcatheter was retracted to the level of the proximal aspect of the residual right uterine artery and a selective right uterine arteriogram was performed. A repeat right internal iliac arteriogram was then performed via the C2 catheter and under continuous fluoroscopic guidance, the distribution of the right internal iliac artery was embolized with Gel-Foam slurry. The C2 catheter was retracted to the level of the proximal aspect of the right internal iliac artery and a post Gel-Foam embolization right internal iliac arteriogram was performed. Next, the C2 catheter was utilized to select the contralateral left internal iliac artery and a selective left internal iliac arteriogram was performed. Next, the vascular distribution of the left internal iliac artery was embolized with Gel-Foam slurry under continuous fluoroscopic guidance. The C2 catheter was retracted to the origin of the left internal iliac artery and a post embolization left internal iliac arteriogram was performed. Over a Bentson wire, the C2 catheter was exchanged for an Omni Flush catheter and a post embolization flush pelvic  arteriogram was performed. 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 At this time, the procedure was terminated. All wires and catheters were removed from the patient however the vascular sheath was secured in place within interrupted suture and maintained in place for continued hemodynamic monitoring per the ICU staff recommendation. 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 The right uterine artery was noted to be hypertrophic in comparison to the left and as such was initially targeted ultimately selected and percutaneously coil embolized. Next, the right internal iliac artery vascular distribution was embolized with Gel-Foam with post embolization arteriogram demonstrating significantly reduced flow through to the right hemipelvis (series 19). Selective left internal iliac arteriogram demonstrated near complete occlusion of the left uterine artery however given the patient's hemodynamic instability the left internal iliac artery vascular distribution was embolized with Gel-Foam. Post embolization angiogram demonstrates significantly reduced flow to the left  hemipelvis (series 24). Completion flush aortogram demonstrates markedly reduced flow throughout the bilateral internal iliac artery vascular distributions. Effort was made to cannulate the  origin of the tiny accessory/aberrant right ovarian artery however this ultimately proved unsuccessful however it is hopeful that this vessel was not contributing to the patient's hemodynamic instability. IMPRESSION: 1. T

## 2021-08-02 NOTE — Consult Note (Signed)
Debra Bernard ?Admit Date: 07/31/2021 ?08/02/2021 ?Rexene Agent ?Requesting Physician:  [name] ? ?Reason for Consult:  AKI ?HPI:  ?60F status post vaginal delivery on 2/29 complicated by postpartum hemorrhage, DIC, hemorrhagic shock requiring emergent hysterectomy, left oophorectomy, IR embolization of uterine artery, and massive transfusion protocol.  Continues with VDRF, shock on NE, VP, PE.  DIC has improved, INR down to 1.7, fibrinogen increased.   ? ?Patient is anuric with declining hourly UOP.  Trend in creatinine is as below.  No preceding kidney labs for review.  No renal imaging available.  No urine analysis. ? ?Patient is intubated, sedated, no family at bedside.  All information taken from chart review, discussion with other providers.  She has no other known PMH. ? ?Creatinine, Ser (mg/dL)  ?Date Value  ?08/02/2021 1.96 (H)  ?08/01/2021 1.54 (H)  ?08/01/2021 1.52 (H)  ?] ?I/Os: ?I/O last 3 completed shifts: ?In: 14483.2 [I.V.:5838.4; NLGXQ:1194; IV Piggyback:1057.8] ?Out: 1740 [Urine:300; Blood:6717]  ?ROS ?Unable to complete review of systems secondary to patient's current status ? ?PMH  ?Past Medical History:  ?Diagnosis Date  ? Medical history non-contributory   ? ?PSH  ?Past Surgical History:  ?Procedure Laterality Date  ? ABDOMINAL HYSTERECTOMY  08/01/2021  ? Procedure: HYSTERECTOMY ABDOMINAL;  Surgeon: [name], MD;  Location: [facility] ORS;  Service: Gynecology;;  ? CYSTOSCOPY  08/01/2021  ? Procedure: CYSTOSCOPY;  Surgeon: [name], MD;  Location: [facility] ORS;  Service: Gynecology;;  ? DILATION AND CURETTAGE OF UTERUS N/A 08/01/2021  ? Procedure: DILATATION AND CURETTAGE;  Surgeon: [name], MD;  Location: [facility] ORS;  Service: Gynecology;  Laterality: N/A;  ? IR ANGIOGRAM SELECTIVE EACH ADDITIONAL VESSEL  08/01/2021  ? IR ANGIOGRAM SELECTIVE EACH ADDITIONAL VESSEL  08/01/2021  ? IR EMBO ART  VEN HEMORR LYMPH EXTRAV  INC GUIDE ROADMAPPING  08/01/2021  ? IR US GUIDE VASC ACCESS RIGHT   08/01/2021  ? NO PAST SURGERIES    ? ?FH  ?Family History  ?Problem Relation Age of Onset  ? Varicose Veins Father   ? Alcohol abuse Neg Hx   ? Arthritis Neg Hx   ? Asthma Neg Hx   ? Birth defects Neg Hx   ? Cancer Neg Hx   ? COPD Neg Hx   ? Depression Neg Hx   ? Diabetes Neg Hx   ? Drug abuse Neg Hx   ? Early death Neg Hx   ? Hearing loss Neg Hx   ? Hyperlipidemia Neg Hx   ? Heart disease Neg Hx   ? Hypertension Neg Hx   ? Kidney disease Neg Hx   ? Learning disabilities Neg Hx   ? Mental illness Neg Hx   ? Mental retardation Neg Hx   ? Miscarriages / Stillbirths Neg Hx   ? Stroke Neg Hx   ? Vision loss Neg Hx   ? ?Metz  reports that she has never smoked. She has never used smokeless tobacco. She reports that she does not drink alcohol and does not use drugs. ?Allergies No Known Allergies ?Home medications ?Prior to Admission medications   ?Medication Sig Start Date End Date Taking? Authorizing Provider  ?Prenatal Vit-Fe Fumarate-FA (PREPLUS) 27-1 MG TABS Take 1 tablet by mouth daily. 03/10/21  Yes Chancy Milroy, MD  ?Blood Pressure Monitoring (BLOOD PRESSURE KIT) DEVI 1 Device by Does not apply route as needed. 02/10/21   Renee Harder, CNM  ?cyclobenzaprine (FLEXERIL) 5 MG tablet Take 1 tablet (5 mg  total) by mouth 3 (three) times daily as needed for muscle spasms. 07/05/21   Luvenia Redden, PA-C  ?Doxylamine-Pyridoxine (DICLEGIS) 10-10 MG TBEC Take 1 tablet by mouth in the morning, at noon, and at bedtime. 01/13/21   Starr Lake, CNM  ?famotidine (PEPCID) 40 MG tablet Take 1 tablet (40 mg total) by mouth daily. 06/21/21   Clarnce Flock, MD  ?hydrocortisone (ANUSOL-HC) 2.5 % rectal cream Place rectally 2 (two) times daily. 07/06/21   Anyanwu, Sallyanne Havers, MD  ?miconazole (MONISTAT 7) 2 % vaginal cream Place 1 Applicatorful vaginally at bedtime. Apply for seven nights 07/22/21   [name], MD  ?polyethylene glycol powder (GLYCOLAX/MIRALAX) 17 GM/SCOOP powder Take 17 g by mouth daily as  needed. 06/21/21   Clarnce Flock, MD  ? ? ?Current Medications ?Scheduled Meds: ? sodium chloride   Intravenous Once  ? chlorhexidine gluconate (MEDLINE KIT)  15 mL Mouth Rinse BID  ? Chlorhexidine Gluconate Cloth  6 each Topical Daily  ? docusate  100 mg Per Tube BID  ? mouth rinse  15 mL Mouth Rinse 10 times per day  ? pantoprazole sodium  40 mg Per Tube Daily  ? polyethylene glycol  17 g Per Tube Daily  ? prenatal multivitamin  1 tablet Per Tube Q1200  ? Tdap  0.5 mL Intramuscular Once  ? ?Continuous Infusions: ? sodium chloride    ? dexmedetomidine (PRECEDEX) IV infusion Stopped (08/02/21 0830)  ? feeding supplement (PIVOT 1.5 CAL)    ? fentaNYL infusion INTRAVENOUS 250 mcg/hr (08/02/21 1300)  ? midazolam 3 mg/hr (08/02/21 1300)  ? norepinephrine (LEVOPHED) Adult infusion 8 mcg/min (08/02/21 1300)  ? phenylephrine (NEO-SYNEPHRINE) Adult infusion 50 mcg/min (08/02/21 1300)  ? piperacillin-tazobactam (ZOSYN)  IV 12.5 mL/hr at 08/02/21 1300  ? vasopressin 0.04 Units/min (08/02/21 1300)  ? ?PRN Meds:.acetaminophen, benzocaine-Menthol, coconut oil, witch hazel-glycerin **AND** dibucaine, diphenhydrAMINE, fentaNYL, iohexol, midazolam, ondansetron **OR** ondansetron (ZOFRAN) IV, simethicone, zolpidem ? ?CBC ?Recent Labs  ?Lab 08/01/21 ?2106 08/02/21 ?0135 08/02/21 ?4098 08/02/21 ?1191 08/02/21 ?0940  ?WBC 12.5* 15.0*  --   --  19.6*  ?NEUTROABS 10.4*  --   --   --  16.8*  ?HGB 9.6* 8.6*  --  6.8* 7.4*  ?HCT 25.6* 23.6*  --  20.0* 20.0*  ?MCV 81.8 83.4  --   --  82.6  ?PLT 103* 126*  122* 131*  --  124*  ? ?Basic Metabolic Panel ?Recent Labs  ?Lab 08/01/21 ?1130 08/01/21 ?1141 08/01/21 ?1446 08/01/21 ?1555 08/01/21 ?1825 08/02/21 ?4782 08/02/21 ?9562 08/02/21 ?1308  ?NA 137 136 142 143 143 138  --  139  ?K 5.9* 5.7* 4.1 3.5 3.1* 4.0  --  4.5  ?CL 108  --  105  --   --  104  --   --   ?CO2 11*  --  18*  --   --  22  --   --   ?GLUCOSE 278*  --  129*  --   --  88  --   --   ?BUN 9  --  10  --   --  19  --   --    ?CREATININE 1.52*  --  1.54*  --   --  1.96*  --   --   ?CALCIUM 7.5*  --  9.1  --   --  7.2*  --   --   ?PHOS  --   --   --   --   --  7.2* 8.9*  --   ? ? ?Physical Exam   ?Blood pressure (!) 123/53, pulse 90, temperature 99 ?F (37.2 ?C), temperature source Oral, resp. rate 18, height _0  (1.651 m), weight 92.1 kg, last menstrual period 10/25/2020, SpO2 97 %, unknown if currently breastfeeding. ?GEN: Intubated, sedated, anasarca noted ?ENT: ET tube in place ?EYES: Eyes closed ?CV: Regular, no murmur, no rub ?PULM: Coarse breath sounds bilaterally ?ABD: Mild distention ?SKIN: Incisional sites not directly visualized ?EXT: Edema as above, 3+ in the lower extremities ? ?Assessment ?33F anuric AKI 2/2 ATN after postpartum hemorrhage, DIC, hemorrhagic shock requiring massive transfusion protocol; VDRF ? ?Anuric AKI 2/2 ATN  ?Presumed normal baseline but not certain based on available labs ?No immediate indications for RRT, would require CRRT given #2; K and HCO3 ok ?Hemorrhagic shock, persistent, on 3 pressors, per CCM ?Postpartum hemorrhage status post MTP 08/01/21 ?S/p hysterectomy, L oophorectomy 08/01/21 ?S/p pelvic angio and embolization with IR 08/01/21 ?DIC, improving ?S/p vaginal delivery 08/01/21 ?Leukocytosis, per CCM ?VDRF ? ?Plan ?Will follow closely assessing HD needs in days to come, very possible will require ?Daily weights, Daily Renal Panel, Strict I/Os, Avoid nephrotoxins (NSAIDs, judicious IV Contrast)  ? ?Rexene Agent  ?08/02/2021, 2:03 PM ? ? ?  ? ?

## 2021-08-02 NOTE — TOC Progression Note (Signed)
Transition of Care (TOC) - Initial/Assessment Note  ? ? ?Patient Details  ?Name: Debra Bernard ?MRN: [id number] ?Date of Birth: March 12, 1986 ? ?Transition of Care (TOC) CM/SW Contact:    ?Paulene Floor Strummer Canipe, LCSWA ?Phone Number: ?08/02/2021, 4:17 PM ? ?Clinical Narrative:                 ? ? ?Transition of Care Department (TOC) has reviewed patient and no TOC needs have been identified at this time. We will continue to monitor patient advancement through interdisciplinary progression rounds. If new patient transition needs arise, please place a TOC consult. ?  ? ?  ?  ? ? ?Patient Goals and CMS Choice ?  ?  ?  ? ?Expected Discharge Plan and Services ?  ?  ?  ?  ?  ?                ?  ?  ?  ?  ?  ?  ?  ?  ?  ?  ? ?Prior Living Arrangements/Services ?  ?  ?  ?       ?  ?  ?  ?  ? ?Activities of Daily Living ?Home Assistive Devices/Equipment: None ?ADL Screening (condition at time of admission) ?Patient's cognitive ability adequate to safely complete daily activities?: Yes ?Patient able to express need for assistance with ADLs?: Yes ?Independently performs ADLs?: Yes (appropriate for developmental age) ?Does the patient have difficulty walking or climbing stairs?: No ?Weakness of Legs: None ?Weakness of Arms/Hands: None ? ?Permission Sought/Granted ?  ?  ?   ?   ?   ?   ? ?Emotional Assessment ?  ?  ?  ?  ?  ?  ? ?Admission diagnosis:  Supervision of high risk elderly multigravida in third trimester [O09.523] ?Patient Active Problem List  ? Diagnosis Date Noted  ? Third-stage postpartum hemorrhage, postpartum 08/01/2021  ? AMA (advanced maternal age) primigravida 35+ 12/30/2020  ? Supervision of high risk pregnancy, antepartum 12/30/2020  ? GBS (group B Streptococcus carrier), +RV culture, currently pregnant 08/19/2013  ? ?PCP:  Mora Bellman, MD ?Pharmacy:   ?[facility] [address] ?[place] ?[place] [address] ?Phone:  [phone] Fax: [phone] ? ? ? ? ?Social Determinants of Health (SDOH) Interventions ?  ? ?Readmission Risk Interventions ?   ? View : No data to display.  ?  ?  ?  ? ? ? ?

## 2021-08-02 NOTE — Progress Notes (Signed)
Initial Nutrition Assessment ? ?DOCUMENTATION CODES:  ? ?Not applicable ? ?INTERVENTION:  ? ?Initiate tube feeds via OG tube: ?- Start Pivot 1.5 @ 20 ml/hr and advance by 10 ml q 6 hours to goal rate of 60 ml/hr (1440 ml/day) ? ?Tube feeding regimen provides 2160 kcal, 135 grams of protein, and 1080 ml of H2O.  ? ?- Continue prenatal MVI daily per tube ? ?NUTRITION DIAGNOSIS:  ? ?Increased nutrient needs related to acute illness as evidenced by estimated needs. ? ?GOAL:  ? ?Patient will meet greater than or equal to 90% of their needs ? ?MONITOR:  ? ?Vent status, Labs, Weight trends, TF tolerance, Skin, I & O's ? ?REASON FOR ASSESSMENT:  ? ?Ventilator, Consult ?Enteral/tube feeding initiation and management ? ?ASSESSMENT:  ? ?36 year old female who presented on 4/23 for induction for delivery. Pt experienced post-partum hemorrhage and DIC and required emergent hysterectomy and L oophorectomy in the OR. Pt also required IR uterine artery ablation. Pt admitted to the ICU on vent support. ? ?Pt remains on vent support post-op. Consult received for tube feeding initiation and management. Pt with OG tube in stomach per abdominal x-ray. Given increasing pressor requirements, will start tube feeds at trickle rate and slowly advance to goal. Discussed plan with RN. ? ?Unable to obtain diet and weight history at this time. Reviewed available weight history in chart. Suspect pre-pregnancy weight was around weight of 83.2 kg from 12/30/20 encounter. Last available weight prior to pt's most recent pregnancy is 79.4 kg from encounter on 03/16/15. ? ?Nephrology has been consulted. Pt with anuric AKI secondary to ATN and may require CRRT given pressor requirements. No immediate indications for RRT per Nephrology note. ? ?Patient is currently intubated on ventilator support ?MV: 7.9 L/min ?Temp (24hrs), Avg:99.1 ?F (37.3 ?C), Min:97.7 ?F (36.5 ?C), Max:100.1 ?F (37.8 ?C) ?BP (a-line): 140/54 ?MAP (a-line):  86 ? ?Drips: ?Fentanyl ?Versed ?Levophed: 7.5 ml/hr ?Phenylephrine: 7.5 ml/hr ?Vasopressin: 12 ml/hr ? ?Medications reviewed and include: colace, protonix, miralax, prenatal MVI, IV abx ? ?Labs reviewed: creatinine 1.96, ionized calcium 0.92, phosphorus 8.9, magnesium 2.8, elevated LFTs, TG 524, WBC 19.6, hemoglobin 7.4, platelets 124 ?CBG's: 49-97 x 24 hours ? ?UOP: 300 ml x 24 hours ?I/O's: +8.2 L since admit ? ?NUTRITION - FOCUSED PHYSICAL EXAM: ? ?Flowsheet Row Most Recent Value  ?Orbital Region No depletion  ?Upper Arm Region No depletion  ?Thoracic and Lumbar Region No depletion  ?Buccal Region Unable to assess  ?Temple Region No depletion  ?Clavicle Bone Region No depletion  ?Clavicle and Acromion Bone Region No depletion  ?Scapular Bone Region No depletion  ?Dorsal Hand No depletion  ?Patellar Region No depletion  ?Anterior Thigh Region No depletion  ?Posterior Calf Region No depletion  ?Edema (RD Assessment) Mild  [generalized]  ?Hair Reviewed  ?Eyes Reviewed  ?Mouth Reviewed  ?Skin Reviewed  ?Nails Reviewed  ? ?  ? ? ?Diet Order:   ?Diet Order   ? ? None  ? ?  ? ? ?EDUCATION NEEDS:  ? ?No education needs have been identified at this time ? ?Skin:  Skin Assessment: ?Skin Integrity Issues: ?Incisions: vagina, abdomen ? ?Last BM:  no documented BM ? ?Height:  ? ?Ht Readings from Last 1 Encounters:  ?07/31/21 '5\' 5"'$  (1.651 m)  ? ? ?Weight:  ? ?Wt Readings from Last 1 Encounters:  ?08/02/21 92.1 kg  ? ? ?Ideal Body Weight:  56.8 kg ? ?BMI:  Body mass index is 33.79 kg/m?. ? ?Estimated Nutritional Needs:  ? ?  Kcal:  2000-2200 ? ?Protein:  115-135 grams ? ?Fluid:  >/= 2.0 L ? ? ? ?Gustavus Bryant, MS, RD, LDN ?Inpatient Clinical Dietitian ?Please see AMiON for contact information. ? ?

## 2021-08-02 NOTE — Progress Notes (Signed)
Mag 1.3 ?Replaced per protocol  ?

## 2021-08-02 NOTE — Progress Notes (Signed)
OB Note ? ?Patient evaluated and updated on plan of care from the RN. Incision with stable blood spot on bottom corner of dressing. Plan to keep vag pack in overnight and d/c in morning ? ?Family member in room and updated on plan of care.  ? ?Durene Romans MD ?Attending ?[facility] [employer] [facility]) ?GYN Consult Phone: [phone] (M-F, 0800-1700) & [phone] (Off hours, weekends, holidays) ?08/02/2021 ?Time: 2284 ? ?

## 2021-08-02 NOTE — Progress Notes (Signed)
eLink Physician-Brief Progress Note ?Patient Name: Debra Bernard ?DOB: [birth year] ?MRN: [id number] ? ? ?Date of Service ? 08/02/2021  ?HPI/Events of Note ? Patient with hypertension and tachycardia concerning for reaction to sub-optimal analgesia.  ?eICU Interventions ? Fentanyl gtt ordered for optimal post operative pain control, with plan to wean Propofol gtt as tolerated once Fentanyl gtt is running, to eliminate the possible contribution of Propofol induced vasodilation / tachycardia at 40 mcg.  ? ? ? ?  ? ?Kerry Kass Myrla Malanowski ?08/02/2021, 12:34 AM ?

## 2021-08-02 NOTE — Progress Notes (Signed)
? ?NAME:  Debra Bernard, MRN:  [id number], DOB:  03-16-1986, LOS: 2 ?ADMISSION DATE:  07/31/2021, CONSULTATION DATE:  08/01/2021 ?REFERRING MD: Dr. Si Raider , CHIEF COMPLAINT:  Post partum Hemorrhage / Emergent Hysterectomy/ DIC and co-agulopathy  ? ?History of Present Illness:  ?All information  obtained from medical records as patient is intubated and sedated ?36 year old female  [phone] at 63+0. Baby had decelerations and vacuum was used to deliver neonate.  Neonate  was notably warm to the touch, with dusky color and poor tone, infant handed to resuscitation / NICU team . ?After delivery maternal temp was taken and was 101 after tylenol was taken.  ?There was Brisk bright red bleeding after delivery of placenta.  Patient was given Pitocin bolus  and IM methergine given with minimal effect.  She received TXA given. Fundus remained firm but brisk bleeding noted. Sweep of uterus revealed no clots or placental fragments. Dr. Si Raider notified of bleeding and intent to place JADA, With initiation of JADA, 253m of blood immediately filled canister and brisk bright red bleeding noted around JADA device. JADA device was removed , uterus and cervix was inspected with no concerning lacerations. A second dose of TXA given at 15 minutes past first dose Despite all interventions, patient continued to have brisk bright red bleeding and became symptomatic and hypotensive. Decision was made to proceed to OR for further management of bleeding. ? ?Pt had an emergency hysterectomy and left oophorectomy in the OR. Labs confirmed DIC.  ?She has received multiple PRBCs, FFP, cryo and platelets with massive transfusion protocol.  Despite surgical exploration patient continued noted having bleeding, she was taken to IR for angiogram and right ovarian artery was embolized, patient remained intubated and sedated and was transferred to ICU for further care ? ?INR > 10, PT > 90, aPTT > 200, Fibrinogen > < 60, D dimer > 20, platelets  ? ? ?Of note  GBSP + on 3/29>> treated with PCN 4/23 ?WBC bumped from 5.9 to 16.6 overnight  ?Cord pH  was 6.9 ?Pro calcitonin 0.58 ?Creatinine 1.54/ Albumin 2.1/ AST 100/ ALT 80/ Total Bili 2.1 ?GFR 19 and Gap 19 at 1445 ? ?Pertinent  Medical History  ?[id number] ?Vaginal delivery 08/01/21 ?Hysterectomy 08/01/2021 ?No further medical history noted in EPIC ? ? ?Significant Hospital Events: ?Including procedures, antibiotic start and stop dates in addition to other pertinent events   ?08/01/2021 Admission and induction vaginal delivery of female infant who was dusky and without tone , transferred to NICU.  ?Emergent Hysterectomy 4/24 ?Massive Transfusion Protocol 10:07 4/24 ?Central Line insertion R IJ 16 cm. 4/24 ?IR for embolization 4/24 ? ?Interim History / Subjective:  ?On some neo, tachycardic with levophed ?Sedated on vent, TG high so on precedex + fent ? ?Objective   ?Blood pressure (!) 76/64, pulse 92, temperature 99.4 ?F (37.4 ?C), temperature source Oral, resp. rate (!) 22, height '5\' 5"'$  (1.651 m), weight 92.1 kg, last menstrual period 10/25/2020, SpO2 99 %, unknown if currently breastfeeding. ?CVP:  [12 mmHg] 12 mmHg  ?Vent Mode: PRVC ?FiO2 (%):  [30 %-100 %] 30 % ?Set Rate:  [22 bmp] 22 bmp ?Vt Set:  [450 mL] 450 mL ?PEEP:  [5 cmH20] 5 cmH20 ?Plateau Pressure:  [13 cmH20-16 cmH20] 13 cmH20  ? ?Intake/Output Summary (Last 24 hours) at 08/02/2021 0851 ?Last data filed at 08/02/2021 0600 ?Gross per 24 hour  ?Intake 14483.18 ml  ?Output 5717 ml  ?Net 8766.18 ml  ? ? ?Filed Weights  ?  07/31/21 0815 08/02/21 0411  ?Weight: 90.3 kg 92.1 kg  ? ? ?Examination: ?Sedated on vent but will startle to stimulation and move all 4 ext ?Foley with blood ?Lung/heart exams normal ?Vent mechanics normal ?Has epidural to be removed later today ?Abdomen hypoactive BS, soft ? ?CXR clear ?Lactate/ABG improved ?Mg low being repleted ?Shock liver/kidney injuries a bit worse ?H/H, plts, INR, fibrinogen do not meet transfusion threshold at  present ? ?Resolved Hospital Problem list   ?Delivery of female infant ? ?Assessment & Plan:  ?Hemorrhagic shock- some combination of uterine and ovarian in origin post vacuum delivery for fetal distress.  No evidence of urological injury.  Required emergent hysterectomy, oophorectomy, and IR ovarian artery ablation. ?Secondary acidemia, DIC, shock liver, shock kidney, acute respiratory failure, encephalopathy. ?GBS+ ? ?- Let rest on vent today, okay to use versed gtt in light of elevated TG ?- Phenylephrine titrated to MAP 65, add vasopressin ?- Avoid acidemia/hypothermia/coagulopathy ?- Abx per OBGYN ?- Check 1PM DIC, CBC, transfusion goal fibrinogen >150, INR < 2, plts > 50k ?- Continue foley for now ?- Epidural removal per anesthesia ?- Family updated at bedside ? ?Best Practice (right click and "Reselect all SmartList Selections" daily)  ? ?Diet/type: start TF ?DVT prophylaxis: SCD ?GI prophylaxis: PPI ?Lines: Central line ?Foley:  Yes, and it is still needed ?Code Status:  full code ?Last date of multidisciplinary goals of care discussion Regis Bill (cousin) updated at bedside] ? ?45 min cc time ?Erskine Emery MD PCCM ?  ?

## 2021-08-02 NOTE — Progress Notes (Signed)
Woke up derecruited, bagged back by RN Crystal. ?Will check LE duplex and CXR to make sure not missing anything. ?Keep heavily sedated today. ?Will probably end up needing CRRT given continued anuria and developing anasarca. ? ?Erskine Emery MD PCCM ?

## 2021-08-03 ENCOUNTER — Inpatient Hospital Stay (HOSPITAL_COMMUNITY): Payer: Medicaid Other

## 2021-08-03 DIAGNOSIS — O1425 HELLP syndrome, complicating the puerperium: Secondary | ICD-10-CM

## 2021-08-03 DIAGNOSIS — D62 Acute posthemorrhagic anemia: Secondary | ICD-10-CM | POA: Diagnosis not present

## 2021-08-03 DIAGNOSIS — R7989 Other specified abnormal findings of blood chemistry: Secondary | ICD-10-CM | POA: Diagnosis not present

## 2021-08-03 DIAGNOSIS — D696 Thrombocytopenia, unspecified: Secondary | ICD-10-CM | POA: Diagnosis not present

## 2021-08-03 DIAGNOSIS — K7200 Acute and subacute hepatic failure without coma: Secondary | ICD-10-CM | POA: Diagnosis not present

## 2021-08-03 LAB — TYPE AND SCREEN
ABO/RH(D): O POS
Antibody Screen: NEGATIVE
Unit division: 0
Unit division: 0
Unit division: 0
Unit division: 0
Unit division: 0
Unit division: 0
Unit division: 0
Unit division: 0
Unit division: 0
Unit division: 0
Unit division: 0
Unit division: 0
Unit division: 0
Unit division: 0
Unit division: 0
Unit division: 0
Unit division: 0
Unit division: 0
Unit division: 0
Unit division: 0
Unit division: 0
Unit division: 0
Unit division: 0
Unit division: 0
Unit division: 0

## 2021-08-03 LAB — BPAM FFP
Blood Product Expiration Date: 202304302359
Blood Product Expiration Date: 202304302359
ISSUE DATE / TIME: 202304251419
ISSUE DATE / TIME: 202304251419
Unit Type and Rh: 5100
Unit Type and Rh: 5100

## 2021-08-03 LAB — BPAM RBC
Blood Product Expiration Date: 202305262359
Blood Product Expiration Date: 202305262359
Blood Product Expiration Date: 202305262359
Blood Product Expiration Date: 202305262359
Blood Product Expiration Date: 202305262359
Blood Product Expiration Date: 202305262359
Blood Product Expiration Date: 202305262359
Blood Product Expiration Date: 202305262359
Blood Product Expiration Date: 202305262359
Blood Product Expiration Date: 202305262359
Blood Product Expiration Date: 202305262359
Blood Product Expiration Date: 202305262359
Blood Product Expiration Date: 202305262359
Blood Product Expiration Date: 202305262359
Blood Product Expiration Date: 202305272359
Blood Product Expiration Date: 202305272359
Blood Product Expiration Date: 202305272359
Blood Product Expiration Date: 202305272359
Blood Product Expiration Date: 202305272359
Blood Product Expiration Date: 202305272359
Blood Product Expiration Date: 202305272359
Blood Product Expiration Date: 202305272359
Blood Product Expiration Date: 202305272359
Blood Product Expiration Date: 202305272359
Blood Product Expiration Date: 202305272359
ISSUE DATE / TIME: 202304240934
ISSUE DATE / TIME: 202304240934
ISSUE DATE / TIME: 202304240940
ISSUE DATE / TIME: 202304240940
ISSUE DATE / TIME: 202304241011
ISSUE DATE / TIME: 202304241011
ISSUE DATE / TIME: 202304241011
ISSUE DATE / TIME: 202304241011
ISSUE DATE / TIME: 202304241015
ISSUE DATE / TIME: 202304241015
ISSUE DATE / TIME: 202304241015
ISSUE DATE / TIME: 202304241027
ISSUE DATE / TIME: 202304241027
ISSUE DATE / TIME: 202304241541
ISSUE DATE / TIME: 202304241622
ISSUE DATE / TIME: 202304241622
ISSUE DATE / TIME: 202304241703
ISSUE DATE / TIME: 202304241703
ISSUE DATE / TIME: 202304242003
ISSUE DATE / TIME: 202304250740
ISSUE DATE / TIME: 202304250740
ISSUE DATE / TIME: 202304251051
ISSUE DATE / TIME: 202304251209
ISSUE DATE / TIME: 202304251930
ISSUE DATE / TIME: 202304251930
Unit Type and Rh: 5100
Unit Type and Rh: 5100
Unit Type and Rh: 5100
Unit Type and Rh: 5100
Unit Type and Rh: 5100
Unit Type and Rh: 5100
Unit Type and Rh: 5100
Unit Type and Rh: 5100
Unit Type and Rh: 5100
Unit Type and Rh: 5100
Unit Type and Rh: 5100
Unit Type and Rh: 5100
Unit Type and Rh: 5100
Unit Type and Rh: 5100
Unit Type and Rh: 5100
Unit Type and Rh: 5100
Unit Type and Rh: 5100
Unit Type and Rh: 5100
Unit Type and Rh: 5100
Unit Type and Rh: 5100
Unit Type and Rh: 5100
Unit Type and Rh: 5100
Unit Type and Rh: 5100
Unit Type and Rh: 5100
Unit Type and Rh: 5100

## 2021-08-03 LAB — CBC
HCT: 25.2 % — ABNORMAL LOW (ref 36.0–46.0)
HCT: 28.1 % — ABNORMAL LOW (ref 36.0–46.0)
HCT: 28.4 % — ABNORMAL LOW (ref 36.0–46.0)
Hemoglobin: 8.9 g/dL — ABNORMAL LOW (ref 12.0–15.0)
Hemoglobin: 9.5 g/dL — ABNORMAL LOW (ref 12.0–15.0)
Hemoglobin: 9.9 g/dL — ABNORMAL LOW (ref 12.0–15.0)
MCH: 30.4 pg (ref 26.0–34.0)
MCH: 29.3 pg (ref 26.0–34.0)
MCH: 30 pg (ref 26.0–34.0)
MCHC: 35.3 g/dL (ref 30.0–36.0)
MCHC: 33.8 g/dL (ref 30.0–36.0)
MCHC: 34.9 g/dL (ref 30.0–36.0)
MCV: 86 fL (ref 80.0–100.0)
MCV: 86.7 fL (ref 80.0–100.0)
MCV: 86.1 fL (ref 80.0–100.0)
Platelets: 71 10*3/uL — ABNORMAL LOW (ref 150–400)
Platelets: 83 10*3/uL — ABNORMAL LOW (ref 150–400)
Platelets: 79 10*3/uL — ABNORMAL LOW (ref 150–400)
RBC: 2.93 MIL/uL — ABNORMAL LOW (ref 3.87–5.11)
RBC: 3.24 MIL/uL — ABNORMAL LOW (ref 3.87–5.11)
RBC: 3.3 MIL/uL — ABNORMAL LOW (ref 3.87–5.11)
RDW: 15.3 % (ref 11.5–15.5)
RDW: 15.5 % (ref 11.5–15.5)
RDW: 15.4 % (ref 11.5–15.5)
WBC: 15.5 10*3/uL — ABNORMAL HIGH (ref 4.0–10.5)
WBC: 17 10*3/uL — ABNORMAL HIGH (ref 4.0–10.5)
WBC: 18.2 10*3/uL — ABNORMAL HIGH (ref 4.0–10.5)
nRBC: 0.8 % — ABNORMAL HIGH (ref 0.0–0.2)
nRBC: 0.6 % — ABNORMAL HIGH (ref 0.0–0.2)
nRBC: 0.2 % (ref 0.0–0.2)

## 2021-08-03 LAB — BPAM PLATELET PHERESIS
Blood Product Expiration Date: 202304252359
Blood Product Expiration Date: 202304262359
Blood Product Expiration Date: 202304262359
ISSUE DATE / TIME: 202304241311
ISSUE DATE / TIME: 202304241311
ISSUE DATE / TIME: 202304241552
Unit Type and Rh: 5100
Unit Type and Rh: 5100
Unit Type and Rh: 6200

## 2021-08-03 LAB — BASIC METABOLIC PANEL
Anion gap: 15 (ref 5–15)
BUN: 36 mg/dL — ABNORMAL HIGH (ref 6–20)
CO2: 19 mmol/L — ABNORMAL LOW (ref 22–32)
Calcium: 7.3 mg/dL — ABNORMAL LOW (ref 8.9–10.3)
Chloride: 106 mmol/L (ref 98–111)
Creatinine, Ser: 3.88 mg/dL — ABNORMAL HIGH (ref 0.44–1.00)
GFR, Estimated: 15 mL/min — ABNORMAL LOW (ref >60)
Glucose, Bld: 147 mg/dL — ABNORMAL HIGH (ref 70–99)
Potassium: 4.5 mmol/L (ref 3.5–5.1)
Sodium: 140 mmol/L (ref 135–145)

## 2021-08-03 LAB — DIC (DISSEMINATED INTRAVASCULAR COAGULATION)PANEL
D-Dimer, Quant: 16.29 ug/mL-FEU — ABNORMAL HIGH (ref 0.00–0.50)
D-Dimer, Quant: >20 ug/mL-FEU — ABNORMAL HIGH (ref 0.00–0.50)
D-Dimer, Quant: >20 ug/mL-FEU — ABNORMAL HIGH (ref 0.00–0.50)
Fibrinogen: 483 mg/dL — ABNORMAL HIGH (ref 210–475)
Fibrinogen: 538 mg/dL — ABNORMAL HIGH (ref 210–475)
Fibrinogen: 598 mg/dL — ABNORMAL HIGH (ref 210–475)
INR: 1.3 — ABNORMAL HIGH (ref 0.8–1.2)
INR: 1.2 (ref 0.8–1.2)
INR: 1.1 (ref 0.8–1.2)
Platelets: 71 10*3/uL — ABNORMAL LOW (ref 150–400)
Platelets: 84 10*3/uL — ABNORMAL LOW (ref 150–400)
Platelets: 80 10*3/uL — ABNORMAL LOW (ref 150–400)
Prothrombin Time: 16.2 seconds — ABNORMAL HIGH (ref 11.4–15.2)
Prothrombin Time: 14.8 seconds (ref 11.4–15.2)
Prothrombin Time: 13.9 seconds (ref 11.4–15.2)
Smear Review: NONE SEEN
Smear Review: NONE SEEN
Smear Review: NONE SEEN
aPTT: 29 seconds (ref 24–36)
aPTT: 27 seconds (ref 24–36)
aPTT: 27 seconds (ref 24–36)

## 2021-08-03 LAB — HEPATIC FUNCTION PANEL
ALT: 1221 U/L — ABNORMAL HIGH (ref 0–44)
AST: 1538 U/L — ABNORMAL HIGH (ref 15–41)
Albumin: 1.9 g/dL — ABNORMAL LOW (ref 3.5–5.0)
Alkaline Phosphatase: 69 U/L (ref 38–126)
Bilirubin, Direct: 0.5 mg/dL — ABNORMAL HIGH (ref 0.0–0.2)
Indirect Bilirubin: 0.9 mg/dL (ref 0.3–0.9)
Total Bilirubin: 1.4 mg/dL — ABNORMAL HIGH (ref 0.3–1.2)
Total Protein: 4.6 g/dL — ABNORMAL LOW (ref 6.5–8.1)

## 2021-08-03 LAB — GLOBAL TEG PANEL
CFF Max Amplitude: 27.2 mm (ref 15–32)
CK with Heparinase (R): 4.6 min (ref 4.3–8.3)
Citrated Functional Fibrinogen: 496.4 mg/dL (ref 278–581)
Citrated Kaolin (K): 1 min (ref 0.8–2.1)
Citrated Kaolin (MA): 64.6 mm (ref 52–69)
Citrated Kaolin (R): 4.2 min — ABNORMAL LOW (ref 4.6–9.1)
Citrated Kaolin Angle: 76.9 deg (ref 63–78)
Citrated Rapid TEG (MA): 63.5 mm (ref 52–70)

## 2021-08-03 LAB — PREPARE PLATELET PHERESIS
Unit division: 0
Unit division: 0
Unit division: 0

## 2021-08-03 LAB — RENAL FUNCTION PANEL
Albumin: 2.1 g/dL — ABNORMAL LOW (ref 3.5–5.0)
Anion gap: 15 (ref 5–15)
BUN: 40 mg/dL — ABNORMAL HIGH (ref 6–20)
CO2: 23 mmol/L (ref 22–32)
Calcium: 7.9 mg/dL — ABNORMAL LOW (ref 8.9–10.3)
Chloride: 104 mmol/L (ref 98–111)
Creatinine, Ser: 3.84 mg/dL — ABNORMAL HIGH (ref 0.44–1.00)
GFR, Estimated: 15 mL/min — ABNORMAL LOW (ref >60)
Glucose, Bld: 124 mg/dL — ABNORMAL HIGH (ref 70–99)
Phosphorus: 9.8 mg/dL — ABNORMAL HIGH (ref 2.5–4.6)
Potassium: 4.1 mmol/L (ref 3.5–5.1)
Sodium: 142 mmol/L (ref 135–145)

## 2021-08-03 LAB — MAGNESIUM: Magnesium: 2.9 mg/dL — ABNORMAL HIGH (ref 1.7–2.4)

## 2021-08-03 LAB — LACTATE DEHYDROGENASE: LDH: 1128 U/L — ABNORMAL HIGH (ref 98–192)

## 2021-08-03 LAB — PREPARE FRESH FROZEN PLASMA: Unit division: 0

## 2021-08-03 LAB — BILIRUBIN, FRACTIONATED(TOT/DIR/INDIR)
Bilirubin, Direct: 0.5 mg/dL — ABNORMAL HIGH (ref 0.0–0.2)
Indirect Bilirubin: 0.6 mg/dL (ref 0.3–0.9)
Total Bilirubin: 1.1 mg/dL (ref 0.3–1.2)

## 2021-08-03 LAB — SURGICAL PATHOLOGY

## 2021-08-03 LAB — PHOSPHORUS: Phosphorus: 11.2 mg/dL — ABNORMAL HIGH (ref 2.5–4.6)

## 2021-08-03 LAB — GLUCOSE, CAPILLARY
Glucose-Capillary: 135 mg/dL — ABNORMAL HIGH (ref 70–99)
Glucose-Capillary: 131 mg/dL — ABNORMAL HIGH (ref 70–99)
Glucose-Capillary: 143 mg/dL — ABNORMAL HIGH (ref 70–99)

## 2021-08-03 MED ORDER — AMLODIPINE BESYLATE 5 MG PO TABS
5.0000 mg | ORAL_TABLET | Freq: Every day | ORAL | Status: DC
Start: 2021-08-03 — End: 2021-08-04
  Administered 2021-08-03 – 2021-08-04 (×2): 5 mg via ORAL
  Filled 2021-08-03 (×2): qty 1

## 2021-08-03 MED ORDER — CYCLOBENZAPRINE HCL 10 MG PO TABS
5.0000 mg | ORAL_TABLET | Freq: Three times a day (TID) | ORAL | Status: DC | PRN
  Administered 2021-08-03 – 2021-08-06 (×2): 5 mg via ORAL
  Filled 2021-08-03 (×2): qty 1

## 2021-08-03 MED ORDER — PRENATAL MULTIVITAMIN CH
1.0000 | ORAL_TABLET | Freq: Every day | ORAL | Status: DC
Start: 2021-08-04 — End: 2021-08-10
  Administered 2021-08-04 – 2021-08-10 (×7): 1 via ORAL
  Filled 2021-08-03 (×7): qty 1

## 2021-08-03 MED ORDER — SODIUM CHLORIDE 0.9% FLUSH: 10.0000 mL | INTRAVENOUS | Status: DC | PRN

## 2021-08-03 MED ORDER — LABETALOL HCL 5 MG/ML IV SOLN
10.0000 mg | INTRAVENOUS | Status: DC | PRN
  Administered 2021-08-03 – 2021-08-04 (×2): 10 mg via INTRAVENOUS
  Filled 2021-08-03 (×2): qty 4

## 2021-08-03 MED ORDER — FAMOTIDINE 20 MG PO TABS
20.0000 mg | ORAL_TABLET | Freq: Two times a day (BID) | ORAL | Status: DC
  Administered 2021-08-03: 20 mg via ORAL
  Filled 2021-08-03: qty 1

## 2021-08-03 MED ORDER — SIMETHICONE 80 MG PO CHEW: 80.0000 mg | CHEWABLE_TABLET | ORAL | Status: DC | PRN

## 2021-08-03 MED ORDER — FUROSEMIDE 10 MG/ML IJ SOLN
120.0000 mg | Freq: Once | INTRAVENOUS | Status: AC
  Administered 2021-08-03: 120 mg via INTRAVENOUS
  Filled 2021-08-03: qty 2

## 2021-08-03 MED ORDER — ACETAMINOPHEN 325 MG PO TABS
650.0000 mg | ORAL_TABLET | ORAL | Status: DC | PRN
Start: 2021-08-03 — End: 2021-08-03

## 2021-08-03 MED ORDER — POLYETHYLENE GLYCOL 3350 17 G PO PACK
17.0000 g | PACK | Freq: Every day | ORAL | Status: DC
Start: 2021-08-04 — End: 2021-08-10
  Administered 2021-08-04 – 2021-08-10 (×4): 17 g via ORAL
  Filled 2021-08-03 (×5): qty 1

## 2021-08-03 MED ORDER — FENTANYL CITRATE (PF) 100 MCG/2ML IJ SOLN
25.0000 ug | INTRAMUSCULAR | Status: DC | PRN
  Administered 2021-08-03 (×2): 75 ug via INTRAVENOUS
  Administered 2021-08-07 – 2021-08-08 (×4): 50 ug via INTRAVENOUS
  Filled 2021-08-03 (×6): qty 2

## 2021-08-03 MED ORDER — FAMOTIDINE 20 MG PO TABS
20.0000 mg | ORAL_TABLET | Freq: Every day | ORAL | Status: DC
  Administered 2021-08-04 – 2021-08-10 (×7): 20 mg via ORAL
  Filled 2021-08-03 (×8): qty 1

## 2021-08-03 MED ORDER — ZOLPIDEM TARTRATE 5 MG PO TABS
5.0000 mg | ORAL_TABLET | Freq: Every evening | ORAL | Status: DC | PRN
  Administered 2021-08-06: 5 mg via ORAL
  Filled 2021-08-03: qty 1

## 2021-08-03 MED ORDER — SODIUM CHLORIDE 0.9% FLUSH
10.0000 mL | Freq: Two times a day (BID) | INTRAVENOUS | Status: DC
  Administered 2021-08-03 – 2021-08-05 (×4): 10 mL
  Administered 2021-08-06: 3 mL
  Administered 2021-08-07 – 2021-08-09 (×5): 10 mL

## 2021-08-03 MED ORDER — DIPHENHYDRAMINE HCL 12.5 MG/5ML PO ELIX: 25.0000 mg | ORAL_SOLUTION | Freq: Four times a day (QID) | ORAL | Status: DC | PRN

## 2021-08-03 MED ORDER — METOPROLOL TARTRATE 5 MG/5ML IV SOLN
INTRAVENOUS | Status: AC
  Administered 2021-08-03: 2.5 mg
  Filled 2021-08-03: qty 5

## 2021-08-03 MED ORDER — ONDANSETRON HCL 4 MG PO TABS: 4.0000 mg | ORAL_TABLET | ORAL | Status: DC | PRN

## 2021-08-03 MED ORDER — DEXAMETHASONE SODIUM PHOSPHATE 10 MG/ML IJ SOLN
6.0000 mg | Freq: Four times a day (QID) | INTRAMUSCULAR | Status: AC
  Administered 2021-08-03 – 2021-08-05 (×7): 6 mg via INTRAVENOUS
  Filled 2021-08-03 (×8): qty 1

## 2021-08-03 MED ORDER — ONDANSETRON HCL 4 MG/2ML IJ SOLN
4.0000 mg | INTRAMUSCULAR | Status: DC | PRN
Start: 2021-08-03 — End: 2021-08-10
  Administered 2021-08-03 (×2): 4 mg via INTRAVENOUS
  Filled 2021-08-03 (×2): qty 2

## 2021-08-03 MED ORDER — DOCUSATE SODIUM 100 MG PO CAPS
100.0000 mg | ORAL_CAPSULE | Freq: Two times a day (BID) | ORAL | Status: DC
  Administered 2021-08-04 – 2021-08-10 (×9): 100 mg via ORAL
  Filled 2021-08-03 (×10): qty 1

## 2021-08-03 MED ORDER — METOPROLOL TARTRATE 5 MG/5ML IV SOLN: 2.5000 mg | Freq: Once | INTRAVENOUS | Status: AC

## 2021-08-03 MED ORDER — OXYCODONE HCL 5 MG PO TABS
5.0000 mg | ORAL_TABLET | ORAL | Status: DC | PRN
  Administered 2021-08-03 – 2021-08-06 (×4): 5 mg via ORAL
  Filled 2021-08-03 (×4): qty 1

## 2021-08-03 NOTE — Progress Notes (Signed)
Pulled femoral sheath at 1820. Held pressure till 1940 and applied Femstop per protocol. Instructed receiving RNs, patient and family  protocol. OB and CCM aware.device is correctly placed with hemostasis at 18AQ above systolic BP. Got prn meds for BP control. Receiving RN at bedside to monitor. Elink to be notified.  ?

## 2021-08-03 NOTE — Progress Notes (Addendum)
Given severely elevated transaminases and relatively quick development of DIC, should entertain possibility of HELLP.  Will start steroids empirically given risk/benefit ('6mg'$  q6h x 6 doses based on studies, should see relatively quick response with less transfusions needed).  OBGYN also requests GI and heme input which I will work on. ? ?Looks fine after extubation, will start some amlodipine for her BP, can do labetalol gtt if run into trouble. ? ?Starting to make urine. ? ?Erskine Emery MD PCCM ?

## 2021-08-03 NOTE — Consult Note (Signed)
?[place] ?Telephone:([phone]   [phone] ? ?INITIAL CONSULT NOTE ? ?Patient Care Team: ?Mora Bellman, MD as PCP - General (Obstetrics and Gynecology) ? ?CHIEF COMPLAINTS/PURPOSE OF CONSULTATION:  ?"Concern for HELLP Syndrome " ? ?HISTORY OF PRESENTING ILLNESS:  ?Debra Bernard 36 y.o. female with medical history significant for spontaneous vaginal delivery complicated by massive hemorrhage who has subsequently developed markedly elevated LFTs and thrombocytopenia. ? ?On review of the previous records Debra Bernard underwent a spontaneous vaginal delivery on 9/62/9528 complicated by postpartum hemorrhage.  Initially her labs on 07/31/2021 showed white blood cell count 5.9, hemoglobin 0.6, and platelets of 180.  After delivery on 08/01/2021 patient had white blood cell count 14.9, hemoglobin 8.4, and platelets of 87.  She underwent massive transfusion protocol between 08/01/2021 and 08/02/2021.  In total she received 7 units of packed red blood cells.  Labs today at 8:21 AM showed white blood cell count 17.0, hemoglobin 9.5, and platelets of 83.  Of note on 08/03/2021 patient had AST of 1538, ALT of 1221, and direct bilirubin of 0.5.  Total bilirubin of 1.4.  Of note LFTs on 08/01/2021 showed AST 86, ALT 66.  Due to concern for possible HELLP Syndrome hematology was consulted for further evaluation management. ? ?On exam today Debra Bernard is accompanied by her cousins.  She is quite hoarse as she was intubated and was only extubated around noon today.  She notes that she is not having any issues with lightheadedness or dizziness.  She denies any overt signs of bleeding at this time such as nosebleeds, gum bleeding, or blood in her stool.  She is having some pink-tinged urine.  She has been having difficulty swallowing and speech pathology has been consulted.  She otherwise denies any fevers, chills, sweats, nausea, vomiting or diarrhea.  A focused ROS is listed below. ? ?MEDICAL HISTORY:   ?Past Medical History:  ?Diagnosis Date  ? Medical history non-contributory   ? ? ?SURGICAL HISTORY: ?Past Surgical History:  ?Procedure Laterality Date  ? ABDOMINAL HYSTERECTOMY  08/01/2021  ? Procedure: HYSTERECTOMY ABDOMINAL;  Surgeon: [name], MD;  Location: [facility];  Service: Gynecology;;  ? CYSTOSCOPY  08/01/2021  ? Procedure: CYSTOSCOPY;  Surgeon: [name], MD;  Location: [facility];  Service: Gynecology;;  ? DILATION AND CURETTAGE OF UTERUS N/A 08/01/2021  ? Procedure: DILATATION AND CURETTAGE;  Surgeon: [name], MD;  Location: [facility];  Service: Gynecology;  Laterality: N/A;  ? IR ANGIOGRAM SELECTIVE EACH ADDITIONAL VESSEL  08/01/2021  ? IR ANGIOGRAM SELECTIVE EACH ADDITIONAL VESSEL  08/01/2021  ? IR EMBO ART  VEN HEMORR LYMPH EXTRAV  INC GUIDE ROADMAPPING  08/01/2021  ? IR US GUIDE VASC ACCESS RIGHT  08/01/2021  ? NO PAST SURGERIES    ? RADIOLOGY WITH ANESTHESIA N/A 08/01/2021  ? Procedure: IR WITH ANESTHESIA;  Surgeon: Radiologist, Medication, MD;  Location: [place];  Service: Radiology;  Laterality: N/A;  ? ? ?SOCIAL HISTORY: ?Social History  ? ?Socioeconomic History  ? Marital status: Married  ?  Spouse name: Not on file  ? Number of children: Not on file  ? Years of education: Not on file  ? Highest education level: Not on file  ?Occupational History  ? Not on file  ?Tobacco Use  ? Smoking status: Never  ? Smokeless tobacco: Never  ?Vaping Use  ? Vaping Use: Never used  ?Substance and Sexual Activity  ? Alcohol use: No  ? Drug use: No  ? Sexual  activity: Yes  ?Other Topics Concern  ? Not on file  ?Social History Narrative  ? Not on file  ? ?Social Determinants of Health  ? ?Financial Resource Strain: Not on file  ?Food Insecurity: No Food Insecurity  ? Worried About Charity fundraiser in the Last Year: Never true  ? Ran Out of Food in the Last Year: Never true  ?Transportation Needs: No Transportation Needs  ? Lack of Transportation (Medical): No  ? Lack of Transportation  (Non-Medical): No  ?Physical Activity: Not on file  ?Stress: Not on file  ?Social Connections: Not on file  ?Intimate Partner Violence: Not on file  ? ? ?FAMILY HISTORY: ?Family History  ?Problem Relation Age of Onset  ? Varicose Veins Father   ? Alcohol abuse Neg Hx   ? Arthritis Neg Hx   ? Asthma Neg Hx   ? Birth defects Neg Hx   ? Cancer Neg Hx   ? COPD Neg Hx   ? Depression Neg Hx   ? Diabetes Neg Hx   ? Drug abuse Neg Hx   ? Early death Neg Hx   ? Hearing loss Neg Hx   ? Hyperlipidemia Neg Hx   ? Heart disease Neg Hx   ? Hypertension Neg Hx   ? Kidney disease Neg Hx   ? Learning disabilities Neg Hx   ? Mental illness Neg Hx   ? Mental retardation Neg Hx   ? Miscarriages / Stillbirths Neg Hx   ? Stroke Neg Hx   ? Vision loss Neg Hx   ? ? ?ALLERGIES:  has No Known Allergies. ? ?MEDICATIONS:  ?Current Facility-Administered Medications  ?Medication Dose Route Frequency Provider Last Rate Last Admin  ? 0.9 %  sodium chloride infusion  250 mL Intravenous Continuous Ogan, Kerry Kass, MD      ? amLODipine (NORVASC) tablet 5 mg  5 mg Oral Daily Candee Furbish, MD   5 mg at 08/03/21 1638  ? benzocaine-Menthol (DERMOPLAST) 20-0.5 % topical spray 1 application.  1 application. Topical PRN Wende Mott, CNM      ? Chlorhexidine Gluconate Cloth 2 % PADS 6 each  6 each Topical Daily Jacky Kindle, MD   6 each at 08/03/21 0800  ? coconut oil  1 application. Topical PRN Wende Mott, CNM      ? cyclobenzaprine (FLEXERIL) tablet 5 mg  5 mg Oral TID PRN Candee Furbish, MD   5 mg at 08/03/21 1638  ? dexamethasone (DECADRON) injection 6 mg  6 mg Intravenous Q6H Candee Furbish, MD      ? witch hazel-glycerin (TUCKS) pad 1 application.  1 application. Topical PRN Wende Mott, CNM      ? And  ? dibucaine (NUPERCAINAL) 1 % rectal ointment 1 application.  1 application. Rectal PRN Wende Mott, CNM      ? diphenhydrAMINE (BENADRYL) 12.5 MG/5ML elixir 25 mg  25 mg Oral Q6H PRN Candee Furbish, MD      ? Derrill Memo ON  08/04/2021] docusate sodium (COLACE) capsule 100 mg  100 mg Oral BID Candee Furbish, MD      ? Derrill Memo ON 08/04/2021] famotidine (PEPCID) tablet 20 mg  20 mg Oral Daily Wouk, Ailene Rud, MD      ? fentaNYL (SUBLIMAZE) injection 25-75 mcg  25-75 mcg Intravenous Q2H PRN Candee Furbish, MD   75 mcg at 08/03/21 1815  ? labetalol (NORMODYNE) injection 10 mg  10 mg Intravenous  Q2H PRN Candee Furbish, MD      ? lip balm (CARMEX) ointment   Topical PRN Candee Furbish, MD      ? ondansetron [facility]) tablet 4 mg  4 mg Oral Q4H PRN Candee Furbish, MD      ? Or  ? ondansetron [facility]) injection 4 mg  4 mg Intravenous Q4H PRN Candee Furbish, MD   4 mg at 08/03/21 2030  ? oxyCODONE (Oxy IR/ROXICODONE) immediate release tablet 5 mg  5 mg Oral Q4H PRN Candee Furbish, MD   5 mg at 08/03/21 1500  ? piperacillin-tazobactam (ZOSYN) IVPB 3.375 g  3.375 g Intravenous Q8H Candee Furbish, MD 12.5 mL/hr at 08/03/21 2238 3.375 g at 08/03/21 2238  ? [START ON 08/04/2021] polyethylene glycol (MIRALAX / GLYCOLAX) packet 17 g  17 g Oral Daily Candee Furbish, MD      ? Derrill Memo ON 08/04/2021] prenatal multivitamin tablet 1 tablet  1 tablet Oral Q1200 Candee Furbish, MD      ? simethicone [facility]) chewable tablet 80 mg  80 mg Oral PRN Candee Furbish, MD      ? sodium chloride flush (NS) 0.9 % injection 10-40 mL  10-40 mL Intracatheter Q12H Candee Furbish, MD   10 mL at 08/03/21 1510  ? sodium chloride flush (NS) 0.9 % injection 10-40 mL  10-40 mL Intracatheter PRN Candee Furbish, MD      ? Tdap Durwin Reges) injection 0.5 mL  0.5 mL Intramuscular Once Wende Mott, CNM      ? zolpidem (AMBIEN) tablet 5 mg  5 mg Oral QHS PRN Candee Furbish, MD      ? ? ?REVIEW OF SYSTEMS:   ?Constitutional: ( - ) fevers, ( - )  chills , ( - ) night sweats ?Eyes: ( - ) blurriness of vision, ( - ) double vision, ( - ) watery eyes ?Ears, nose, mouth, throat, and face: ( - ) mucositis, ( - ) sore throat ?Respiratory: ( - ) cough, ( - ) dyspnea, ( - )  wheezes ?Cardiovascular: ( - ) palpitation, ( - ) chest discomfort, ( - ) lower extremity swelling ?Gastrointestinal:  ( - ) nausea, ( - ) heartburn, ( - ) change in bowel habits ?Skin: ( - ) abnormal skin rashes ?[place]ym

## 2021-08-03 NOTE — Progress Notes (Signed)
Daily Obstetrics Note  ?Admission Date: 07/31/2021 ?Current Date: 08/03/2021 ?9:25 AM ? ?Debra Bernard is a 36 y.o. [id number] POD#2 s/p vac assissted vag delivery c/b PPH and DIC>D&C/ex-lap/supracervical hyst and LSO/cysto/IR embolizations admitted for induction of labor at 39wks for unstable lie ? ?Pregnancy complicated by: ?Patient Active Problem List  ? Diagnosis Date Noted  ? Third-stage postpartum hemorrhage, postpartum 08/01/2021  ? AMA (advanced maternal age) multigravida 3+, third trimester 12/30/2020  ? Supervision of high risk pregnancy, antepartum 12/30/2020  ? GBS (group B Streptococcus carrier), +RV culture, currently pregnant 08/19/2013  ? ? ?Overnight/24hr events:  ?Patient received blood products yesterday. UOP minimal (foley in place). ? ?Subjective:  ?Patient responsive (still intubated) to questions. No overt signs of stress or pain ? ?Objective:  ? ? Current Vital Signs 24h Vital Sign Ranges  ?T 98.5 ?F (36.9 ?C) Temp  Avg: 99.1 ?F (37.3 ?C)  Min: 98.1 ?F (36.7 ?C)  Max: 100.1 ?F (37.8 ?C)  ?BP (!) 150/87 ? BP  Min: 108/50  Max: 150/87  ?HR (!) 103 ? Pulse  Avg: 78.8  Min: 58  Max: 140  ?RR 20 Resp  Avg: 17.3  Min: 3  Max: 27  ?SaO2 97 % Ventilator SpO2  Avg: 97 %  Min: 87 %  Max: 100 %  ?    ? 24 Hour I/O Current Shift I/O  ?Time ?Ins ?Outs 04/25 0701 - 04/26 0700 ?In: 3011.9 [I.V.:1538.4] ?Out: 350 [Urine:350] 04/26 0701 - 04/26 1900 ?In: 100.7 [I.V.:78.2] ?Out: -   ? ?Patient Vitals for the past 24 hrs: ? BP Temp Temp src Pulse Resp SpO2 Weight  ?08/03/21 0816 (!) 150/87 -- -- (!) 103 20 97 % --  ?08/03/21 0700 -- -- -- (!) 59 18 97 % --  ?08/03/21 0630 -- -- -- (!) 58 18 98 % --  ?08/03/21 0615 -- -- -- 68 18 97 % --  ?08/03/21 0600 -- -- -- (!) 58 18 97 % --  ?08/03/21 0545 -- -- -- (!) 58 18 98 % --  ?08/03/21 0530 -- -- -- 60 18 97 % --  ?08/03/21 0515 -- -- -- (!) 59 18 98 % --  ?08/03/21 0500 -- -- -- 60 18 98 % 95.6 kg  ?08/03/21 0445 -- -- -- (!) 58 18 98 % --  ?08/03/21 0430 -- -- -- 61  20 98 % --  ?08/03/21 0415 -- -- -- 79 12 100 % --  ?08/03/21 0400 -- -- -- 64 18 96 % --  ?08/03/21 0345 -- 98.5 ?F (36.9 ?C) Axillary 66 14 95 % --  ?08/03/21 0332 -- -- -- 66 18 95 % --  ?08/03/21 0330 -- -- -- 72 (!) 7 95 % --  ?08/03/21 0315 -- -- -- 64 (!) 3 96 % --  ?08/03/21 0300 -- -- -- 63 (!) 4 97 % --  ?08/03/21 0245 -- -- -- 62 14 96 % --  ?08/03/21 0230 -- -- -- 60 18 97 % --  ?08/03/21 0215 -- -- -- 61 18 97 % --  ?08/03/21 0200 -- -- -- 64 18 98 % --  ?08/03/21 0145 -- -- -- 66 18 98 % --  ?08/03/21 0130 -- -- -- 65 18 97 % --  ?08/03/21 0115 -- -- -- 67 18 97 % --  ?08/03/21 0100 -- -- -- 74 18 96 % --  ?08/03/21 0045 -- -- -- 82 18 96 % --  ?08/03/21 0030 -- -- -- 84 18  96 % --  ?08/03/21 0015 -- -- -- 84 18 96 % --  ?08/03/21 0000 -- -- -- 73 18 97 % --  ?08/02/21 2352 -- 99.2 ?F (37.3 ?C) Axillary 76 18 96 % --  ?08/02/21 2345 -- -- -- 81 (!) 4 96 % --  ?08/02/21 2344 (!) 108/50 -- -- 81 18 99 % --  ?08/02/21 2330 -- -- -- 69 18 97 % --  ?08/02/21 2315 -- -- -- 68 18 97 % --  ?08/02/21 2310 -- 98.9 ?F (37.2 ?C) Axillary 66 18 98 % --  ?08/02/21 2300 -- -- -- 69 18 98 % --  ?08/02/21 2245 -- -- -- 70 18 98 % --  ?08/02/21 2230 -- -- -- 74 18 98 % --  ?08/02/21 2215 -- -- -- 70 18 98 % --  ?08/02/21 2200 -- -- -- 64 18 98 % --  ?08/02/21 2145 -- -- -- 70 18 97 % --  ?08/02/21 2140 -- 99.1 ?F (37.3 ?C) Axillary 63 18 99 % --  ?08/02/21 2130 -- -- -- 70 18 98 % --  ?08/02/21 2125 -- 98.1 ?F (36.7 ?C) -- 74 18 98 % --  ?08/02/21 2124 -- 98.1 ?F (36.7 ?C) -- -- -- -- --  ?08/02/21 2115 -- -- -- 82 18 97 % --  ?08/02/21 2100 -- -- -- 74 17 98 % --  ?08/02/21 2045 -- -- -- 78 18 97 % --  ?08/02/21 2030 -- -- -- 88 16 97 % --  ?08/02/21 2015 -- -- -- 83 16 97 % --  ?08/02/21 2009 -- 98.8 ?F (37.1 ?C) -- 82 16 98 % --  ?08/02/21 2000 -- -- -- 78 18 97 % --  ?08/02/21 1954 -- 100 ?F (37.8 ?C) Oral 82 18 97 % --  ?08/02/21 1950 -- -- -- 88 20 98 % --  ?08/02/21 1947 -- 100 ?F (37.8 ?C) Oral -- -- -- --   ?08/02/21 1945 -- -- -- 85 18 98 % --  ?08/02/21 1930 -- -- -- 92 17 96 % --  ?08/02/21 1915 -- -- -- 99 20 99 % --  ?08/02/21 1900 -- -- -- 80 (!) 9 97 % --  ?08/02/21 1815 -- -- -- 87 18 96 % --  ?08/02/21 1800 -- -- -- 85 18 97 % --  ?08/02/21 1745 -- -- -- 82 (!) 6 97 % --  ?08/02/21 1730 -- -- -- (!) 102 10 97 % --  ?08/02/21 1715 -- -- -- (!) 140 (!) 25 97 % --  ?08/02/21 1700 -- -- -- (!) 126 (!) 27 94 % --  ?08/02/21 1645 -- -- -- 86 19 98 % --  ?08/02/21 1630 -- -- -- 78 18 98 % --  ?08/02/21 1615 -- -- -- 74 18 98 % --  ?08/02/21 1600 -- 98.9 ?F (37.2 ?C) Oral 79 19 99 % --  ?08/02/21 1557 (!) 128/49 -- -- 83 18 98 % --  ?08/02/21 1545 -- -- -- 75 18 98 % --  ?08/02/21 1530 -- -- -- 87 18 97 % --  ?08/02/21 1529 -- 98.9 ?F (37.2 ?C) Oral 86 18 98 % --  ?08/02/21 1515 -- -- -- 81 18 98 % --  ?08/02/21 1514 -- 99.1 ?F (37.3 ?C) Oral 81 18 98 % --  ?08/02/21 1509 -- 98.8 ?F (37.1 ?C) Oral 81 18 98 % --  ?08/02/21 1500 -- -- -- 83 18 97 % --  ?08/02/21  1445 -- 99.2 ?F (37.3 ?C) Oral 73 18 97 % --  ?08/02/21 1430 -- 99.1 ?F (37.3 ?C) Oral 81 18 98 % --  ?08/02/21 1415 (!) 140/51 99.1 ?F (37.3 ?C) Oral 82 18 98 % --  ?08/02/21 1400 -- 99.2 ?F (37.3 ?C) Oral 77 18 98 % --  ?08/02/21 1345 -- -- -- 81 18 98 % --  ?08/02/21 1330 -- -- -- 86 19 98 % --  ?08/02/21 1315 -- -- -- 88 18 98 % --  ?08/02/21 1300 -- -- -- 90 18 97 % --  ?08/02/21 1245 -- 99 ?F (37.2 ?C) Oral 98 19 96 % --  ?08/02/21 1234 -- 100 ?F (37.8 ?C) Oral 99 18 96 % --  ?08/02/21 1230 -- -- -- 96 18 96 % --  ?08/02/21 1219 -- 100.1 ?F (37.8 ?C) Oral 94 18 95 % --  ?08/02/21 1215 -- -- -- 95 18 95 % --  ?08/02/21 1200 -- -- -- 80 18 95 % --  ?08/02/21 1145 -- -- -- 72 18 96 % --  ?08/02/21 1130 -- -- -- 99 18 94 % --  ?08/02/21 1128 (!) 123/53 -- -- 91 18 94 % --  ?08/02/21 1119 -- 99.5 ?F (37.5 ?C) Oral -- -- -- --  ?08/02/21 1115 -- -- -- 97 18 (!) 87 % --  ?08/02/21 1100 -- -- -- 90 18 97 % --  ?08/02/21 1045 -- -- -- 94 18 98 % --  ?08/02/21 1030  -- -- -- 97 18 97 % --  ?08/02/21 1015 -- -- -- 95 18 97 % --  ?08/02/21 1000 -- -- -- 93 18 97 % --  ?08/02/21 0945 -- -- -- 63 (!) 22 98 % --  ?08/02/21 0930 -- -- -- 82 (!) 22 98 % --  ? ?Physical exam: ?General: intubated. NAD ?GU: vaginal packing removed (blood soaked). No overt VB after removal. Scant amount of very bloody UOP in foley bag ?Abdomen: +edema, dressing stable ? ?Medications: ?Current Facility-Administered Medications  ?Medication Dose Route Frequency Provider Last Rate Last Admin  ? 0.9 %  sodium chloride infusion (Manually program via Guardrails IV Fluids)   Intravenous Once Candee Furbish, MD      ? 0.9 %  sodium chloride infusion  250 mL Intravenous Continuous Ogan, Kerry Kass, MD      ? acetaminophen (TYLENOL) tablet 650 mg  650 mg Per Tube Q4H PRN Wende Mott, CNM      ? benzocaine-Menthol (DERMOPLAST) 20-0.5 % topical spray 1 application.  1 application. Topical PRN Wende Mott, CNM      ? chlorhexidine gluconate (MEDLINE KIT) (PERIDEX) 0.12 % solution 15 mL  15 mL Mouth Rinse BID Gwynne Edinger, MD   15 mL at 08/02/21 2009  ? Chlorhexidine Gluconate Cloth 2 % PADS 6 each  6 each Topical Daily Jacky Kindle, MD   6 each at 08/02/21 1521  ? coconut oil  1 application. Topical PRN Wende Mott, CNM      ? witch hazel-glycerin (TUCKS) pad 1 application.  1 application. Topical PRN Wende Mott, CNM      ? And  ? dibucaine (NUPERCAINAL) 1 % rectal ointment 1 application.  1 application. Rectal PRN Wende Mott, CNM      ? diphenhydrAMINE (BENADRYL) 12.5 MG/5ML elixir 25 mg  25 mg Per Tube Q6H PRN Evelena Peat, [place], [place]      ? docusate (COLACE) 50 MG/5ML  liquid 100 mg  100 mg Per Tube BID [place] Crane M, PA-C      ? feeding supplement (PIVOT 1.5 CAL) liquid 1,000 mL  1,000 mL Per Tube Continuous Candee Furbish, MD   Held at 08/02/21 2010  ? fentaNYL (SUBLIMAZE) bolus via infusion 50 mcg  50 mcg Intravenous Q1H PRN Evelena Peat, [place], [place]      ? fentaNYL  2550mg in NS 2535m(1031mml) infusion-PREMIX  0-400 mcg/hr Intravenous Continuous OgaFrederik PearD 10 mL/hr at 08/03/21 0800 100 mcg/hr at 08/03/21 0800  ? furosemide (LASIX) 120 mg in dextrose 5 % 5

## 2021-08-03 NOTE — Progress Notes (Signed)
? ?NAME:  Debra Bernard, MRN:  [id number], DOB:  1986/01/03, LOS: 3 ?ADMISSION DATE:  07/31/2021, CONSULTATION DATE:  08/01/2021 ?REFERRING MD: Dr. Si Raider , CHIEF COMPLAINT:  Post partum Hemorrhage / Emergent Hysterectomy/ DIC and co-agulopathy  ? ?History of Present Illness:  ?All information  obtained from medical records as patient is intubated and sedated ?36 year old female  [phone] at 34+0. Baby had decelerations and vacuum was used to deliver neonate.  Neonate  was notably warm to the touch, with dusky color and poor tone, infant handed to resuscitation / NICU team . ?After delivery maternal temp was taken and was 101 after tylenol was taken.  ?There was Brisk bright red bleeding after delivery of placenta.  Patient was given Pitocin bolus  and IM methergine given with minimal effect.  She received TXA given. Fundus remained firm but brisk bleeding noted. Sweep of uterus revealed no clots or placental fragments. Dr. Si Raider notified of bleeding and intent to place JADA, With initiation of JADA, 21m of blood immediately filled canister and brisk bright red bleeding noted around JADA device. JADA device was removed , uterus and cervix was inspected with no concerning lacerations. A second dose of TXA given at 15 minutes past first dose Despite all interventions, patient continued to have brisk bright red bleeding and became symptomatic and hypotensive. Decision was made to proceed to OR for further management of bleeding. ? ?Pt had an emergency hysterectomy and left oophorectomy in the OR. Labs confirmed DIC.  ?She has received multiple PRBCs, FFP, cryo and platelets with massive transfusion protocol.  Despite surgical exploration patient continued noted having bleeding, she was taken to IR for angiogram and right ovarian artery was embolized, patient remained intubated and sedated and was transferred to ICU for further care ? ?INR > 10, PT > 90, aPTT > 200, Fibrinogen > < 60, D dimer > 20, platelets  ? ? ?Of note  GBSP + on 3/29>> treated with PCN 4/23 ?WBC bumped from 5.9 to 16.6 overnight  ?Cord pH  was 6.9 ?Pro calcitonin 0.58 ?Creatinine 1.54/ Albumin 2.1/ AST 100/ ALT 80/ Total Bili 2.1 ?GFR 19 and Gap 19 at 1445 ? ?Pertinent  Medical History  ?[id number] ?Vaginal delivery 08/01/21 ?Hysterectomy 08/01/2021 ?No further medical history noted in EPIC ? ? ?Significant Hospital Events: ?Including procedures, antibiotic start and stop dates in addition to other pertinent events   ?08/01/2021 Admission and induction vaginal delivery of female infant who was dusky and without tone , transferred to NICU.  ?Emergent Hysterectomy 4/24 ?Massive Transfusion Protocol 10:07 4/24 ?Central Line insertion R IJ 16 cm. 4/24 ?IR for embolization 4/24 ? ?Interim History / Subjective:  ?Pressors needs have resolved. ?Awake on vent. ?UoP up a bit. ? ?Objective   ?Blood pressure (!) 108/50, pulse (!) 58, temperature 98.5 ?F (36.9 ?C), temperature source Axillary, resp. rate 18, height '5\' 5"'$  (1.651 m), weight 95.6 kg, last menstrual period 10/25/2020, SpO2 98 %, unknown if currently breastfeeding. ?   ?Vent Mode: PRVC ?FiO2 (%):  [30 %] 30 % ?Set Rate:  [18 bmp] 18 bmp ?Vt Set:  [450 mL] 450 mL ?PEEP:  [5 cmH20] 5 cmH20 ?Plateau Pressure:  [12 cmH20-15 cmH20] 13 cmH20  ? ?Intake/Output Summary (Last 24 hours) at 08/03/2021 0746 ?Last data filed at 08/03/2021 0600 ?Gross per 24 hour  ?Intake 3011.87 ml  ?Output 850 ml  ?Net 2161.87 ml  ? ? ?Filed Weights  ? 07/31/21 0815 08/02/21 0411 08/03/21 0500  ?Weight: 90.3 kg  92.1 kg 95.6 kg  ? ? ?Examination: ?No distress ?Moves all 4 ext to command ?Ext warm ?Abdomen soft ?Global anasarca ? ?CXR look okay ?Cr a bit worse ?H/H okay, plts down a bit more ?INR okay ? ?Resolved Hospital Problem list   ?Delivery of female infant ? ?Assessment & Plan:  ?Hemorrhagic shock- some combination of uterine and ovarian in origin post vacuum delivery for fetal distress.  No evidence of urological injury.  Required emergent  hysterectomy, oophorectomy, and IR ovarian artery ablation.  Seems to have settled out. ?Secondary acidemia, DIC, shock liver, shock kidney, acute respiratory failure, encephalopathy.  Encephalopathy/DIC improved.  Hopefully AKI is plateau-ing.  Would like for her to get into auto-diuresis phase.  Nephrology following in case needs HD.  Appreciate input. ?GBS+ ? ?- Zosyn x 5 days ?- SBT/SAT work toward extubation ?- DIC/CBC q8h for now, transfusion thresholds per yesterday's note ?- Keep foley/central line for now ?- Multimodal pain control ?- Avoid nephrotoxins, trend LFT/Cr ?- Infant-mother interaction per neonatal team, appreciate help ? ?Best Practice (right click and "Reselect all SmartList Selections" daily)  ? ?Diet/type: start TF ?DVT prophylaxis: SCD ?GI prophylaxis: PPI ?Lines: Central line ?Foley:  Yes, and it is still needed ?Code Status:  full code ?Last date of multidisciplinary goals of care discussion Regis Bill (cousin) updated at bedside] ? ?36 min cc time ?Erskine Emery MD PCCM ?  ?

## 2021-08-03 NOTE — Progress Notes (Signed)
Coags have normalized. Plt 88k. Epidural catheter removed tip intact. Had some old blood at insertion site. No swelling erythema or new drainage at site. Sterile dressing placed over site lower back. Patient awake and responsive. Still intubated but plan is for extubation. Cr now 3.88. ?

## 2021-08-03 NOTE — Progress Notes (Signed)
Admit: 07/31/2021 ?LOS: 3 ? ?33F anuric AKI 2/2 ATN after postpartum hemorrhage, DIC, hemorrhagic shock requiring massive transfusion protocol; VDRF ? ?Subjective:  ?Pressor requirement reduced overnight ?Remains intubated ?Creatinine worsened to 3.9, K4.5, bicarbonate 19. ?0.35 L urine output yesterday. ?Family at bedside ? ?04/25 0701 - 04/26 0700 ?In: 3011.9 [I.V.:1538.4; Blood:1124.6; IV Piggyback:318.9] ?Out: 350 [Urine:350] ? ?Filed Weights  ? 07/31/21 0815 08/02/21 0411 08/03/21 0500  ?Weight: 90.3 kg 92.1 kg 95.6 kg  ? ? ?Scheduled Meds: ? chlorhexidine gluconate (MEDLINE KIT)  15 mL Mouth Rinse BID  ? Chlorhexidine Gluconate Cloth  6 each Topical Daily  ? docusate  100 mg Per Tube BID  ? mouth rinse  15 mL Mouth Rinse 10 times per day  ? pantoprazole (PROTONIX) IV  40 mg Intravenous Q24H  ? polyethylene glycol  17 g Per Tube Daily  ? prenatal multivitamin  1 tablet Per Tube Q1200  ? sodium chloride flush  10-40 mL Intracatheter Q12H  ? Tdap  0.5 mL Intramuscular Once  ? ?Continuous Infusions: ? sodium chloride    ? feeding supplement (PIVOT 1.5 CAL) Stopped (08/02/21 2010)  ? fentaNYL infusion INTRAVENOUS 100 mcg/hr (08/03/21 0800)  ? midazolam Stopped (08/03/21 0730)  ? norepinephrine (LEVOPHED) Adult infusion 3 mcg/min (08/03/21 0800)  ? piperacillin-tazobactam (ZOSYN)  IV Stopped (08/03/21 0747)  ? vasopressin 0.04 Units/min (08/03/21 0800)  ? ?PRN Meds:.acetaminophen, benzocaine-Menthol, coconut oil, witch hazel-glycerin **AND** dibucaine, diphenhydrAMINE, fentaNYL, iohexol, lip balm, midazolam, ondansetron **OR** ondansetron (ZOFRAN) IV, simethicone, sodium chloride flush, zolpidem ? ?Current Labs: reviewed  ? ? ?Physical Exam:  Blood pressure (!) 184/93, pulse 98, temperature 98.5 ?F (36.9 ?C), temperature source Axillary, resp. rate 15, height 5' 5"  (1.651 m), weight 95.6 kg, last menstrual period 10/25/2020, SpO2 99 %, unknown if currently breastfeeding. ?GEN: Intubated, sedated, anasarca noted ?ENT:  ET tube in place ?EYES: Eyes closed ?CV: Regular, no murmur, no rub ?PULM: Coarse breath sounds bilaterally ?ABD: Mild distention ?SKIN: Incisional sites not directly visualized ?EXT: Edema as above, 3+ in the lower extremities ? ?A ?Oliguric AKI 2/2 ATN 2/2 #2 ?Presumed normal baseline but not certain based on available labs ?Hemorrhagic shock, persistent but overall improving, per CCM ?Postpartum hemorrhage status post MTP 08/01/21; hemoglobin has stabilized ?S/p hysterectomy, L oophorectomy 08/01/21, OB/GYN following ?S/p pelvic angio and embolization with IR 08/01/21 ?DIC, improving ?S/p vaginal delivery 08/01/21 ?Leukocytosis, per CCM ?VDRF ? ?P ?No indication for dialysis, remain optimistic for recovery of GFR in time ?Trial of Lasix 120 mg IV once today ?Medication Issues; ?Preferred narcotic agents for pain control are hydromorphone, fentanyl, and methadone. Morphine should not be used.  ?Baclofen should be avoided ?Avoid oral sodium phosphate and magnesium citrate based laxatives / bowel preps  ? ? ?Pearson Grippe MD ?08/03/2021, 12:31 PM ? ?Recent Labs  ?Lab 08/02/21 ?0135 08/02/21 ?0908 08/02/21 ?0924 08/02/21 ?1640 08/02/21 ?1700 08/03/21 ?0445  ?NA 138  --  139 142  --  140  ?K 4.0  --  4.5 4.6  --  4.5  ?CL 104  --   --  106  --  106  ?CO2 22  --   --  20*  --  19*  ?GLUCOSE 88  --   --  108*  --  147*  ?BUN 19  --   --  30*  --  36*  ?CREATININE 1.96*  --   --  3.45*  --  3.88*  ?CALCIUM 7.2*  --   --  7.5*  --  7.3*  ?PHOS 7.2* 8.9*  --   --  10.0* 11.2*  ? ?Recent Labs  ?Lab 08/01/21 ?2106 08/02/21 ?0135 08/02/21 ?0404 08/02/21 ?1630 08/02/21 ?1830 08/03/21 ?0105 08/03/21 ?5913 08/03/21 ?6859  ?WBC 12.5*   < > 19.6*   < > 18.8* 15.5* 17.0*  --   ?NEUTROABS 10.4*  --  16.8*  --   --   --   --   --   ?HGB 9.6*   < > 7.4*   < > 7.2* 8.9* 9.5*  --   ?HCT 25.6*   < > 20.0*   < > 20.8* 25.2* 28.1*  --   ?MCV 81.8   < > 82.6   < > 87.0 86.0 86.7  --   ?PLT 103*   < > 124*   < > 109* 71*  71* 83* 84*  ? < > =  values in this interval not displayed.  ? ? ? ? ? ? ? ? ? ?  ?

## 2021-08-03 NOTE — Procedures (Signed)
Extubation Procedure Note ? ?Patient Details:   ?Name: Debra Bernard ?DOB: June 22, 1985 ?MRN: [id number] ?  ?Airway Documentation:  ?  ?Vent end date: 08/03/21 Vent end time: 1237  ? ?Evaluation ? O2 sats: stable throughout ?Complications: No apparent complications ?Patient did tolerate procedure well. ?Bilateral Breath Sounds: Clear, Diminished ?  ?Yes, ? ?Pt was extubated per order, to 3L [place]. Prior to extubation pt did have a positive cuff leak. Pt tolerated well with SVS. Pt was able to state her name. No stridor noted. RT will continue to monitor pt. ? ?Jorje Guild ?08/03/2021, 12:39 PM ? ?

## 2021-08-03 NOTE — Consult Note (Addendum)
? ?                                                                          [facility] Consult: ?3:32 PM ?08/03/2021 ? LOS: 3 days  ? ? ?Referring Provider: Dr Ina Homes CCM  ?Primary Care Physician:  Mora Bellman, MD ?Primary Gastroenterologist:  unassigned  ? ? ? ?Reason for Consultation: Elevated LFTs, question HELLP versus ischemic liver injury post massive hemorrhaging following complicated delivery. ?  ?HPI: Debra Bernard is a 36 y.o. female.  ?Patient was admitted 7 days ago at 40+ weeks of pregnancy.  Underwent vaginal delivery for "unstable lie".  Developed brisk bleeding after delivery of placenta treated with Pitocin, Methergine, TXA, JADA vacuum placement.  Ultimately underwent emergency hysterectomy, left oophorectomy.  Labs confirmed DIC.  Received massive transfusion protocol of PRBCs, FFP, cryo, platelets.  Continued to have bleeding and ultimately underwent embolization R ovarian artery.  Then transferred to ICU on multiple pressors, vent.  Extubated today and off all pressors now.  Vaginal packing was removed earlier today.  Remains on broad-spectrum antibiotics.  Anuric AKI persists. ? ?LFTs are elevated with T. bili 2.1 ..  1.4.  Alk phos normal.  AST 86 ..  1950.  ALT 66 ..  1221. ?Hgb 11.6 ... 6.8.. 9.5.  Was 11 in late 04/2021.  Platelets 180 .. 87 .. 131 .. 71.. 84.   ?INR greater than 10... 1.2.   ?D-dimer consistently greater than 20.  Fibrinogen 538. ?AKI w GFR 46.. 15.  BUN/CREAT 9/1.5 .. 36/3.8. ?Was HCV and HepBsAg negative in 01/2021.   ? ?Per chart family history consist of varicose vein in her father who is deceased. ?Her husband is in [place].  She has an aunt listed Aissa Hamado [phone] ?"Other":  Radila Mohamed [phone].  Lots of extended family who are caring for pts 2 children at home, infant still inpt.   ? ?No ETOH ever.   ? ?Past Medical History:  ?Diagnosis  Date  ? Medical history non-contributory   ? ? ?Past Surgical History:  ?Procedure Laterality Date  ? ABDOMINAL HYSTERECTOMY  08/01/2021  ? Procedure: HYSTERECTOMY ABDOMINAL;  Surgeon: [name], MD;  Location: [facility];  Service: Gynecology;;  ? CYSTOSCOPY  08/01/2021  ? Procedure: CYSTOSCOPY;  Surgeon: [name], MD;  Location: [facility];  Service: Gynecology;;  ? DILATION AND CURETTAGE OF UTERUS N/A 08/01/2021  ? Procedure: DILATATION AND CURETTAGE;  Surgeon: [name], MD;  Location: [facility];  Service: Gynecology;  Laterality: N/A;  ? IR ANGIOGRAM SELECTIVE EACH ADDITIONAL VESSEL  08/01/2021  ? IR ANGIOGRAM SELECTIVE EACH ADDITIONAL VESSEL  08/01/2021  ? IR EMBO ART  VEN HEMORR LYMPH EXTRAV  INC GUIDE ROADMAPPING  08/01/2021  ? IR US GUIDE VASC ACCESS RIGHT  08/01/2021  ? NO PAST SURGERIES    ? RADIOLOGY WITH ANESTHESIA N/A 08/01/2021  ? Procedure: IR WITH ANESTHESIA;  Surgeon: Radiologist, Medication, MD;  Location: [facility];  Service: Radiology;  Laterality: N/A;  ? ? ?Prior to Admission medications   ?Medication Sig Start Date End Date Taking? Authorizing Provider  ?Blood Pressure Monitoring (BLOOD PRESSURE KIT) DEVI 1 Device by Does not apply route  as needed. 02/10/21   Renee Harder, CNM  ?cyclobenzaprine (FLEXERIL) 5 MG tablet Take 1 tablet (5 mg total) by mouth 3 (three) times daily as needed for muscle spasms. 07/05/21   Luvenia Redden, PA-C  ?Doxylamine-Pyridoxine (DICLEGIS) 10-10 MG TBEC Take 1 tablet by mouth in the morning, at noon, and at bedtime. 01/13/21   Starr Lake, CNM  ?famotidine (PEPCID) 40 MG tablet Take 1 tablet (40 mg total) by mouth daily. 06/21/21   Clarnce Flock, MD  ?hydrocortisone (ANUSOL-HC) 2.5 % rectal cream Place rectally 2 (two) times daily. 07/06/21   Anyanwu, Sallyanne Havers, MD  ?miconazole (MONISTAT 7) 2 % vaginal cream Place 1 Applicatorful vaginally at bedtime. Apply for seven nights 07/22/21   [name], MD  ?polyethylene glycol powder  (GLYCOLAX/MIRALAX) 17 GM/SCOOP powder Take 17 g by mouth daily as needed. 06/21/21   Clarnce Flock, MD  ?Prenatal Vit-Fe Fumarate-FA (PREPLUS) 27-1 MG TABS Take 1 tablet by mouth daily. 03/10/21   Chancy Milroy, MD  ? ? ?Scheduled Meds: ? amLODipine  5 mg Oral Daily  ? chlorhexidine gluconate (MEDLINE KIT)  15 mL Mouth Rinse BID  ? Chlorhexidine Gluconate Cloth  6 each Topical Daily  ? dexamethasone (DECADRON) injection  6 mg Intravenous Q6H  ? [START ON 08/04/2021] docusate sodium  100 mg Oral BID  ? famotidine  20 mg Oral BID  ? mouth rinse  15 mL Mouth Rinse 10 times per day  ? [START ON 08/04/2021] polyethylene glycol  17 g Oral Daily  ? [START ON 08/04/2021] prenatal multivitamin  1 tablet Oral Q1200  ? sodium chloride flush  10-40 mL Intracatheter Q12H  ? Tdap  0.5 mL Intramuscular Once  ? ?Infusions: ? sodium chloride    ? piperacillin-tazobactam (ZOSYN)  IV Stopped (08/03/21 0747)  ? ?PRN Meds: ?benzocaine-Menthol, coconut oil, cyclobenzaprine, witch hazel-glycerin **AND** dibucaine, diphenhydrAMINE, fentaNYL (SUBLIMAZE) injection, lip balm, ondansetron **OR** ondansetron (ZOFRAN) IV, oxyCODONE, simethicone, sodium chloride flush, zolpidem ? ? ?Allergies as of 07/22/2021  ? (No Known Allergies)  ? ? ?Family History  ?Problem Relation Age of Onset  ? Varicose Veins Father   ? Alcohol abuse Neg Hx   ? Arthritis Neg Hx   ? Asthma Neg Hx   ? Birth defects Neg Hx   ? Cancer Neg Hx   ? COPD Neg Hx   ? Depression Neg Hx   ? Diabetes Neg Hx   ? Drug abuse Neg Hx   ? Early death Neg Hx   ? Hearing loss Neg Hx   ? Hyperlipidemia Neg Hx   ? Heart disease Neg Hx   ? Hypertension Neg Hx   ? Kidney disease Neg Hx   ? Learning disabilities Neg Hx   ? Mental illness Neg Hx   ? Mental retardation Neg Hx   ? Miscarriages / Stillbirths Neg Hx   ? Stroke Neg Hx   ? Vision loss Neg Hx   ? ? ?Social History  ? ?Socioeconomic History  ? Marital status: Married  ?  Spouse name: Not on file  ? Number of children: Not on file  ?  Years of education: Not on file  ? Highest education level: Not on file  ?Occupational History  ? Not on file  ?Tobacco Use  ? Smoking status: Never  ? Smokeless tobacco: Never  ?Vaping Use  ? Vaping Use: Never used  ?Substance and Sexual Activity  ? Alcohol use: No  ? Drug use: No  ?  Sexual activity: Yes  ?Other Topics Concern  ? Not on file  ?Social History Narrative  ? Not on file  ? ?Social Determinants of Health  ? ?Financial Resource Strain: Not on file  ?Food Insecurity: No Food Insecurity  ? Worried About Charity fundraiser in the Last Year: Never true  ? Ran Out of Food in the Last Year: Never true  ?Transportation Needs: No Transportation Needs  ? Lack of Transportation (Medical): No  ? Lack of Transportation (Non-Medical): No  ?Physical Activity: Not on file  ?Stress: Not on file  ?Social Connections: Not on file  ?Intimate Partner Violence: Not on file  ? ? ?REVIEW OF SYSTEMS: ?Constitutional:  feeling weak ?ENT:  No nose bleeds ?Pulm:  no SOB.   Observed to cough w drinking liquids after extubation today ?CV:  No palpitations, no angina.  New LE edema since emergency/gyn hemorrhaging ?GU:  No hematuria, no frequency ?GI:  see HPI ?Heme:  excessive vaginal bleeding as per HPI   ?Transfusions: Massive transfusion protocol as per HPI. ?Neuro:  No headaches, no peripheral tingling or numbness.  No history of seizures or syncope. ?Derm:  No itching, no rash or sores.  ?Endocrine:  No sweats or chills.  No polyuria or dysuria ?Immunization: Vaccination history reviewed. ? ? ?PHYSICAL EXAM: ?Vital signs in last 24 hours: ?Vitals:  ? 08/03/21 1300 08/03/21 1400  ?BP:    ?Pulse: (!) 120 (!) 116  ?Resp: 14 11  ?Temp:    ?SpO2: 94% 94%  ? ?Wt Readings from Last 3 Encounters:  ?08/03/21 95.6 kg  ?07/28/21 88.8 kg  ?07/22/21 89.2 kg  ? ? ?General: Patient is alert and communicative though her voice is weak.  She looks exhausted. ?Head: No facial asymmetry or signs of head trauma. ?Eyes: Conjunctiva is pink.  No  scleral icterus.  EOMI. ?Ears: Not hard of hearing ?Nose: No discharge or congestion ?Mouth: Mucosa is moist, pink, clear.  Good dentition.  Tongue midline. ?Neck: No JVD, thyromegaly or masses. ?Lungs: C

## 2021-08-03 NOTE — Addendum Note (Signed)
Addendum  created 08/03/21 1506 by Josephine Igo, MD  ? Clinical Note Signed  ?  ?

## 2021-08-04 ENCOUNTER — Inpatient Hospital Stay (HOSPITAL_COMMUNITY): Payer: Medicaid Other

## 2021-08-04 ENCOUNTER — Encounter (HOSPITAL_COMMUNITY): Payer: Self-pay | Admitting: Emergency Medicine

## 2021-08-04 DIAGNOSIS — D62 Acute posthemorrhagic anemia: Secondary | ICD-10-CM | POA: Diagnosis not present

## 2021-08-04 DIAGNOSIS — R7989 Other specified abnormal findings of blood chemistry: Secondary | ICD-10-CM | POA: Diagnosis not present

## 2021-08-04 DIAGNOSIS — R600 Localized edema: Secondary | ICD-10-CM | POA: Diagnosis not present

## 2021-08-04 LAB — DIC (DISSEMINATED INTRAVASCULAR COAGULATION)PANEL
D-Dimer, Quant: >20 ug/mL-FEU — ABNORMAL HIGH (ref 0.00–0.50)
D-Dimer, Quant: >20 ug/mL-FEU — ABNORMAL HIGH (ref 0.00–0.50)
Fibrinogen: 527 mg/dL — ABNORMAL HIGH (ref 210–475)
Fibrinogen: 509 mg/dL — ABNORMAL HIGH (ref 210–475)
INR: 1.2 (ref 0.8–1.2)
INR: 1.2 (ref 0.8–1.2)
Platelets: 77 10*3/uL — ABNORMAL LOW (ref 150–400)
Platelets: 76 10*3/uL — ABNORMAL LOW (ref 150–400)
Prothrombin Time: 14.6 seconds (ref 11.4–15.2)
Prothrombin Time: 14.9 seconds (ref 11.4–15.2)
Smear Review: NONE SEEN
Smear Review: NONE SEEN
aPTT: 26 seconds (ref 24–36)
aPTT: 26 seconds (ref 24–36)

## 2021-08-04 LAB — COMPREHENSIVE METABOLIC PANEL
ALT: 1288 U/L — ABNORMAL HIGH (ref 0–44)
AST: 1088 U/L — ABNORMAL HIGH (ref 15–41)
Albumin: 1.9 g/dL — ABNORMAL LOW (ref 3.5–5.0)
Alkaline Phosphatase: 71 U/L (ref 38–126)
Anion gap: 11 (ref 5–15)
BUN: 42 mg/dL — ABNORMAL HIGH (ref 6–20)
CO2: 26 mmol/L (ref 22–32)
Calcium: 6.7 mg/dL — ABNORMAL LOW (ref 8.9–10.3)
Chloride: 106 mmol/L (ref 98–111)
Creatinine, Ser: 3.62 mg/dL — ABNORMAL HIGH (ref 0.44–1.00)
GFR, Estimated: 16 mL/min — ABNORMAL LOW (ref >60)
Glucose, Bld: 146 mg/dL — ABNORMAL HIGH (ref 70–99)
Potassium: 4.4 mmol/L (ref 3.5–5.1)
Sodium: 143 mmol/L (ref 135–145)
Total Bilirubin: 1.1 mg/dL (ref 0.3–1.2)
Total Protein: 4.7 g/dL — ABNORMAL LOW (ref 6.5–8.1)

## 2021-08-04 LAB — CBC
HCT: 25.4 % — ABNORMAL LOW (ref 36.0–46.0)
HCT: 26.4 % — ABNORMAL LOW (ref 36.0–46.0)
Hemoglobin: 8.8 g/dL — ABNORMAL LOW (ref 12.0–15.0)
Hemoglobin: 9.4 g/dL — ABNORMAL LOW (ref 12.0–15.0)
MCH: 29.7 pg (ref 26.0–34.0)
MCH: 30.5 pg (ref 26.0–34.0)
MCHC: 34.6 g/dL (ref 30.0–36.0)
MCHC: 35.6 g/dL (ref 30.0–36.0)
MCV: 85.7 fL (ref 80.0–100.0)
MCV: 85.8 fL (ref 80.0–100.0)
Platelets: 76 10*3/uL — ABNORMAL LOW (ref 150–400)
Platelets: 79 10*3/uL — ABNORMAL LOW (ref 150–400)
RBC: 2.96 MIL/uL — ABNORMAL LOW (ref 3.87–5.11)
RBC: 3.08 MIL/uL — ABNORMAL LOW (ref 3.87–5.11)
RDW: 15.3 % (ref 11.5–15.5)
RDW: 15.4 % (ref 11.5–15.5)
WBC: 14.4 10*3/uL — ABNORMAL HIGH (ref 4.0–10.5)
WBC: 16 10*3/uL — ABNORMAL HIGH (ref 4.0–10.5)
nRBC: 0.2 % (ref 0.0–0.2)
nRBC: 0.3 % — ABNORMAL HIGH (ref 0.0–0.2)

## 2021-08-04 LAB — BASIC METABOLIC PANEL
Anion gap: 12 (ref 5–15)
BUN: 40 mg/dL — ABNORMAL HIGH (ref 6–20)
CO2: 24 mmol/L (ref 22–32)
Calcium: 7.8 mg/dL — ABNORMAL LOW (ref 8.9–10.3)
Chloride: 106 mmol/L (ref 98–111)
Creatinine, Ser: 3.41 mg/dL — ABNORMAL HIGH (ref 0.44–1.00)
GFR, Estimated: 17 mL/min — ABNORMAL LOW (ref >60)
Glucose, Bld: 150 mg/dL — ABNORMAL HIGH (ref 70–99)
Potassium: 3.8 mmol/L (ref 3.5–5.1)
Sodium: 142 mmol/L (ref 135–145)

## 2021-08-04 LAB — GLUCOSE, CAPILLARY
Glucose-Capillary: 136 mg/dL — ABNORMAL HIGH (ref 70–99)
Glucose-Capillary: 146 mg/dL — ABNORMAL HIGH (ref 70–99)
Glucose-Capillary: 146 mg/dL — ABNORMAL HIGH (ref 70–99)
Glucose-Capillary: 150 mg/dL — ABNORMAL HIGH (ref 70–99)
Glucose-Capillary: 152 mg/dL — ABNORMAL HIGH (ref 70–99)

## 2021-08-04 LAB — URINALYSIS, ROUTINE W REFLEX MICROSCOPIC
Bilirubin Urine: NEGATIVE
Glucose, UA: 50 mg/dL — AB
Ketones, ur: NEGATIVE mg/dL
Nitrite: NEGATIVE
Protein, ur: 100 mg/dL — AB
RBC / HPF: >50 RBC/hpf — ABNORMAL HIGH (ref 0–5)
Specific Gravity, Urine: 1.018 (ref 1.005–1.030)
WBC, UA: >50 WBC/hpf — ABNORMAL HIGH (ref 0–5)
pH: 5 (ref 5.0–8.0)

## 2021-08-04 LAB — ECHOCARDIOGRAM COMPLETE
AR max vel: 2.85 cm2
AV Peak grad: 9.1 mmHg
Ao pk vel: 1.51 m/s
Area-P 1/2: 4.97 cm2
Height: 65 in
S' Lateral: 2.7 cm
Weight: 3259.28 oz

## 2021-08-04 LAB — CALCIUM, IONIZED: Calcium, Ionized, Serum: 4.3 mg/dL — ABNORMAL LOW (ref 4.5–5.6)

## 2021-08-04 LAB — SODIUM, URINE, RANDOM: Sodium, Ur: 39 mmol/L

## 2021-08-04 LAB — CREATININE, URINE, RANDOM: Creatinine, Urine: 94.54 mg/dL

## 2021-08-04 LAB — PROTIME-INR
INR: 1.2 (ref 0.8–1.2)
Prothrombin Time: 14.6 seconds (ref 11.4–15.2)

## 2021-08-04 LAB — VITAMIN D 25 HYDROXY (VIT D DEFICIENCY, FRACTURES): Vit D, 25-Hydroxy: 16.69 ng/mL — ABNORMAL LOW (ref 30–100)

## 2021-08-04 MED ORDER — AMOXICILLIN-POT CLAVULANATE 875-125 MG PO TABS
1.0000 | ORAL_TABLET | Freq: Two times a day (BID) | ORAL | Status: DC
  Administered 2021-08-04: 1 via ORAL
  Filled 2021-08-04 (×2): qty 1

## 2021-08-04 MED ORDER — ENSURE ENLIVE PO LIQD
237.0000 mL | Freq: Three times a day (TID) | ORAL | Status: DC
  Administered 2021-08-04 – 2021-08-10 (×18): 237 mL via ORAL
  Filled 2021-08-04 (×21): qty 237

## 2021-08-04 MED ORDER — AMOXICILLIN-POT CLAVULANATE 500-125 MG PO TABS
500.0000 mg | ORAL_TABLET | Freq: Two times a day (BID) | ORAL | Status: AC
  Administered 2021-08-04 – 2021-08-06 (×4): 500 mg via ORAL
  Filled 2021-08-04 (×7): qty 1

## 2021-08-04 MED ORDER — CALCIUM ACETATE (PHOS BINDER) 667 MG PO CAPS
667.0000 mg | ORAL_CAPSULE | Freq: Three times a day (TID) | ORAL | Status: DC
  Administered 2021-08-04 – 2021-08-08 (×12): 667 mg via ORAL
  Filled 2021-08-04 (×14): qty 1

## 2021-08-04 MED ORDER — CARVEDILOL 3.125 MG PO TABS
3.1250 mg | ORAL_TABLET | Freq: Two times a day (BID) | ORAL | Status: DC
  Administered 2021-08-04: 3.125 mg via ORAL
  Filled 2021-08-04 (×2): qty 1

## 2021-08-04 NOTE — Progress Notes (Signed)
Nutrition Follow-up ? ?DOCUMENTATION CODES:  ? ?Not applicable ? ?INTERVENTION:  ? ?- Continue prenatal MVI daily ? ?- Ensure Enlive po TID, each supplement provides 350 kcal and 20 grams of protein ? ?- Encourage PO intake ? ?NUTRITION DIAGNOSIS:  ? ?Increased nutrient needs related to acute illness as evidenced by estimated needs. ? ?Ongoing, being addressed via diet advancement and supplements ? ?GOAL:  ? ?Patient will meet greater than or equal to 90% of their needs ? ?Progressing ? ?MONITOR:  ? ?PO intake, Supplement acceptance, Diet advancement, Labs, Weight trends, Skin, I & O's ? ?REASON FOR ASSESSMENT:  ? ?Ventilator, Consult ?Enteral/tube feeding initiation and management ? ?ASSESSMENT:  ? ?36 year old female who presented on 4/23 for induction for delivery. Pt experienced post-partum hemorrhage and DIC and required emergent hysterectomy and L oophorectomy in the OR. Pt also required IR uterine artery ablation. Pt admitted to the ICU on vent support. ? ?04/26 - extubated ?04/27 - diet advanced to full liquids ? ?Discussed pt with RN and during ICU rounds. Noted pt never received TF while on vent support due to instability. Plan for pt to tx to [facility] specialty care today. Noted concern for HELLP and GI and Oncology consulted. Per notes, HELLP seems less likely at this point. ? ?Spoke with pt at bedside. Family member in room. Pt resting in chair and had consumed some applesauce and water so far this morning. Pt willing to try [place] ice pop along with a strawberry Ensure which RD provided. Discussed importance of adequate kcal and protein intake to promote healing and maintain lean muscle mass. Pt expresses understanding. ? ?Medications reviewed and include: IV decadron, colace, pepcid, miralax, prenatal MVI ? ?Labs reviewed: BUN 42, creatinine 3.62, phosphorus 9.8 on 4/26, magnesium 2.9 on 4/26, elevated LFTs, WBC 14.4, hemoglobin 8.8, platelets 79 ?CBG's: 136-150 x 24 hours ? ?UOP: 6250 ml x 24  hours ?I/O's: +4.2 L since admit ? ?Diet Order:   ?Diet Order   ? ?       ?  Diet full liquid Room service appropriate? Yes; Fluid consistency: Thin  Diet effective now       ?  ? ?  ?  ? ?  ? ? ?EDUCATION NEEDS:  ? ?Education needs have been addressed ? ?Skin:  Skin Assessment: ?Skin Integrity Issues: ?Incisions: vagina, abdomen ? ?Last BM:  08/02/21 ? ?Height:  ? ?Ht Readings from Last 1 Encounters:  ?07/31/21 '5\' 5"'$  (1.651 m)  ? ? ?Weight:  ? ?Wt Readings from Last 1 Encounters:  ?08/04/21 92.4 kg  ? ? ?Ideal Body Weight:  56.8 kg ? ?BMI:  Body mass index is 33.9 kg/m?. ? ?Estimated Nutritional Needs:  ? ?Kcal:  2000-2200 ? ?Protein:  115-135 grams ? ?Fluid:  >/= 2.0 L ? ? ? ?Gustavus Bryant, MS, RD, LDN ?Inpatient Clinical Dietitian ?Please see [facility] for contact information. ? ?

## 2021-08-04 NOTE — Progress Notes (Signed)
Daily Obstetrics Note  ?Admission Date: 07/31/2021 ?Current Date: 08/04/2021 ?8:37 AM ? ?Shylah Dossantos is a 36 y.o. [id number] POD#3 s/p vac assissted vag delivery c/b PPH and DIC>D&C/ex-lap/supracervical hyst and LSO/cysto/IR embolizations admitted for induction of labor at 39wks for unstable lie ? ?Pregnancy complicated by: ?Patient Active Problem List  ? Diagnosis Date Noted  ? Third-stage postpartum hemorrhage, postpartum 08/01/2021  ? AMA (advanced maternal age) multigravida 60+, third trimester 12/30/2020  ? Supervision of high risk pregnancy, antepartum 12/30/2020  ? GBS (group B Streptococcus carrier), +RV culture, currently pregnant 08/19/2013  ? ? ?Overnight/24hr events:  ?Concern for HELLP given plts, LFTs and HTN yesterday so heme and GI consulted ? ?Subjective:  ?Pt resting comfortably and responsive.  ? ?Objective:  ? ? Current Vital Signs 24h Vital Sign Ranges  ?T 97.8 ?F (36.6 ?C) Temp  Avg: 98.2 ?F (36.8 ?C)  Min: 97.8 ?F (36.6 ?C)  Max: 99.1 ?F (37.3 ?C)  ?BP 112/76 ? BP  Min: 112/76  Max: 184/93  ?HR 67 ? Pulse  Avg: 97.6  Min: 64  Max: 127  ?RR 12 Resp  Avg: 16.4  Min: 10  Max: 25  ?SaO2 96 % Nasal Cannula SpO2  Avg: 95.8 %  Min: 93 %  Max: 99 %  ?    ? 24 Hour I/O Current Shift I/O  ?Time ?Ins ?Outs 04/26 0701 - 04/27 0700 ?In: 319.6 [I.V.:116.1] ?Out: 6250 [Urine:6250] No intake/output data recorded.  ? ?Patient Vitals for the past 24 hrs: ? BP Temp Temp src Pulse Resp SpO2 Weight  ?08/04/21 0742 -- 97.8 ?F (36.6 ?C) Oral -- -- -- --  ?08/04/21 0700 112/76 -- -- 67 12 96 % --  ?08/04/21 0600 130/87 -- -- 64 20 99 % --  ?08/04/21 0500 133/82 -- -- 84 19 95 % 92.4 kg  ?08/04/21 0400 129/77 97.8 ?F (36.6 ?C) Oral 86 16 95 % --  ?08/04/21 0300 (!) 147/88 -- -- 90 10 97 % --  ?08/04/21 0200 140/88 -- -- 81 19 97 % --  ?08/04/21 0100 (!) 138/92 -- -- 82 12 96 % --  ?08/04/21 0000 (!) 136/92 99.1 ?F (37.3 ?C) Oral 96 20 96 % --  ?08/03/21 2300 (!) 147/94 -- -- 94 (!) 25 94 % --  ?08/03/21 2253 (!) 145/93 --  -- -- -- -- --  ?08/03/21 2200 (!) 144/109 -- -- 99 19 98 % --  ?08/03/21 2100 (!) 135/96 -- -- 95 15 96 % --  ?08/03/21 2000 (!) 145/99 98.1 ?F (36.7 ?C) Oral 100 (!) 21 98 % --  ?08/03/21 1900 (!) 142/88 -- -- (!) 101 17 94 % --  ?08/03/21 1800 -- -- -- (!) 112 19 94 % --  ?08/03/21 1700 -- -- -- (!) 116 (!) 21 96 % --  ?08/03/21 1600 -- -- -- (!) 115 16 93 % --  ?08/03/21 1500 -- -- -- (!) 106 13 93 % --  ?08/03/21 1400 -- -- -- (!) 116 11 94 % --  ?08/03/21 1300 -- -- -- (!) 120 14 94 % --  ?08/03/21 1200 -- -- -- (!) 127 10 98 % --  ?08/03/21 1102 (!) 184/93 -- -- 98 15 99 % --  ? ? ?Physical exam: ?General: intubated. NAD ?GU:  no gross VB. Yellow UOP ?Abdomen: +edema, dressing stable ? ?Medications: ?Current Facility-Administered Medications  ?Medication Dose Route Frequency Provider Last Rate Last Admin  ? 0.9 %  sodium chloride infusion  250 mL Intravenous  Continuous Frederik Pear, MD      ? amLODipine (NORVASC) tablet 5 mg  5 mg Oral Daily Candee Furbish, MD   5 mg at 08/03/21 1638  ? amoxicillin-clavulanate (AUGMENTIN) 875-125 MG per tablet 1 tablet  1 tablet Oral Q12H Candee Furbish, MD      ? benzocaine-Menthol (DERMOPLAST) 20-0.5 % topical spray 1 application.  1 application. Topical PRN Wende Mott, CNM      ? Chlorhexidine Gluconate Cloth 2 % PADS 6 each  6 each Topical Daily Jacky Kindle, MD   6 each at 08/03/21 0800  ? coconut oil  1 application. Topical PRN Wende Mott, CNM      ? cyclobenzaprine (FLEXERIL) tablet 5 mg  5 mg Oral TID PRN Candee Furbish, MD   5 mg at 08/03/21 1638  ? dexamethasone (DECADRON) injection 6 mg  6 mg Intravenous Q6H Candee Furbish, MD   6 mg at 08/04/21 3295  ? witch hazel-glycerin (TUCKS) pad 1 application.  1 application. Topical PRN Wende Mott, CNM      ? And  ? dibucaine (NUPERCAINAL) 1 % rectal ointment 1 application.  1 application. Rectal PRN Wende Mott, CNM      ? diphenhydrAMINE (BENADRYL) 12.5 MG/5ML elixir 25 mg  25 mg Oral  Q6H PRN Candee Furbish, MD      ? docusate sodium (COLACE) capsule 100 mg  100 mg Oral BID Candee Furbish, MD      ? famotidine (PEPCID) tablet 20 mg  20 mg Oral Daily Wouk, Ailene Rud, MD      ? fentaNYL (SUBLIMAZE) injection 25-75 mcg  25-75 mcg Intravenous Q2H PRN Candee Furbish, MD   75 mcg at 08/03/21 1815  ? labetalol (NORMODYNE) injection 10 mg  10 mg Intravenous Q2H PRN Candee Furbish, MD   10 mg at 08/04/21 0145  ? lip balm (CARMEX) ointment   Topical PRN Candee Furbish, MD      ? ondansetron [facility]) tablet 4 mg  4 mg Oral Q4H PRN Candee Furbish, MD      ? Or  ? ondansetron [facility]) injection 4 mg  4 mg Intravenous Q4H PRN Candee Furbish, MD   4 mg at 08/03/21 2030  ? oxyCODONE (Oxy IR/ROXICODONE) immediate release tablet 5 mg  5 mg Oral Q4H PRN Candee Furbish, MD   5 mg at 08/03/21 2252  ? polyethylene glycol (MIRALAX / GLYCOLAX) packet 17 g  17 g Oral Daily Candee Furbish, MD      ? prenatal multivitamin tablet 1 tablet  1 tablet Oral Q1200 Candee Furbish, MD      ? simethicone [facility]) chewable tablet 80 mg  80 mg Oral PRN Candee Furbish, MD      ? sodium chloride flush (NS) 0.9 % injection 10-40 mL  10-40 mL Intracatheter Q12H Candee Furbish, MD   10 mL at 08/03/21 1510  ? sodium chloride flush (NS) 0.9 % injection 10-40 mL  10-40 mL Intracatheter PRN Candee Furbish, MD      ? Tdap Durwin Reges) injection 0.5 mL  0.5 mL Intramuscular Once Wende Mott, CNM      ? zolpidem (AMBIEN) tablet 5 mg  5 mg Oral QHS PRN Candee Furbish, MD      ? ? ?Labs:  ? ?Recent Labs  ?Lab 08/01/21 ?2106 08/02/21 ?0135 08/02/21 ?1884 08/02/21 ?1630 08/03/21 ?0105 08/03/21 ?1660 08/03/21 ?6301  08/03/21 ?1613 08/03/21 ?2355 08/04/21 ?0600  ?WBC 12.5*   < > 19.6*   < > 15.5* 17.0*  --  18.2* 16.0* 14.4*  ?HGB 9.6*   < > 7.4*   < > 8.9* 9.5*  --  9.9* 9.4* 8.8*  ?HCT 25.6*   < > 20.0*   < > 25.2* 28.1*  --  28.4* 26.4* 25.4*  ?PLT 103*   < > 124*   < > 71*  71* 83* 84* 80*  79* 77*  76* 76*  79*  ?MCV 81.8    < > 82.6   < > 86.0 86.7  --  86.1 85.7 85.8  ?MCH 30.7   < > 30.6   < > 30.4 29.3  --  30.0 30.5 29.7  ?MCHC 37.5*   < > 37.0*   < > 35.3 33.8  --  34.9 35.6 34.6  ?RDW 14.7   < > 15.7*   < > 15.3 15.5  --  15.4 15.4 15.3  ?LYMPHSABS 1.1  --  1.6  --   --   --   --   --   --   --   ?MONOABS 0.8  --  1.1*  --   --   --   --   --   --   --   ?EOSABS 0.0  --  0.0  --   --   --   --   --   --   --   ?BASOSABS 0.0  --  0.0  --   --   --   --   --   --   --   ? < > = values in this interval not displayed.  ? ? ? ?Recent Labs  ?Lab 08/01/21 ?1130 08/01/21 ?1141 08/01/21 ?1446 08/01/21 ?1448 08/01/21 ?1555 08/01/21 ?1800 08/01/21 ?1825 08/01/21 ?2106 08/01/21 ?2135 08/02/21 ?0135 08/02/21 ?0829 08/02/21 ?0908 08/02/21 ?6269 08/02/21 ?1630 08/02/21 ?1640 08/02/21 ?1700 08/03/21 ?0105 08/03/21 ?4854 08/03/21 ?6270 08/03/21 ?3500 08/03/21 ?1416 08/03/21 ?1612 08/03/21 ?1613 08/03/21 ?2355 08/04/21 ?0600  ?NA 137   < > 142  --    < >  --    < >  --   --  138  --   --  139  --  142  --   --  140  --   --   --  142  --   --  143  ?K 5.9*   < > 4.1  --    < >  --    < >  --   --  4.0  --   --  4.5  --  4.6  --   --  4.5  --   --   --  4.1  --   --  4.4  ?CL 108  --  105  --   --   --   --   --   --  104  --   --   --   --  106  --   --  106  --   --   --  104  --   --  106  ?CO2 11*  --  18*  --   --   --   --   --   --  22  --   --   --   --  20*  --   --  19*  --   --   --  23  --   --  26  ?GLUCOSE 278*  --  129*  --   --   --   --   --   --  88  --   --   --   --  108*  --   --  147*  --   --   --  124*  --   --  146*  ?BUN 9  --  10  --   --   --   --   --   --  19  --   --   --   --  30*  --   --  36*  --   --   --  40*  --   --  42*  ?CREATININE 1.52*  --  1.54*  --   --   --   --   --   --  1.96*  --   --   --   --  3.45*  --   --  3.88*  --   --   --  3.84*  --   --  3.62*  ?CALCIUM 7.5*  --  9.1  --   --   --   --   --   --  7.2*  --   --   --   --  7.5*  --   --  7.3*  --   --   --  7.9*  --   --  6.7*  ?AST 86*  --   100*  --   --   --   --   --   --  310*  --   --   --   --   --   --   --   --   --  1,538*  --   --   --   --  1,088*  ?ALT 66*  --  80*  --   --   --   --   --   --  217*  --   --   --   --   --   --

## 2021-08-04 NOTE — Evaluation (Signed)
Clinical/Bedside Swallow Evaluation ?Patient Details  ?Name: Debra Bernard ?MRN: [id number] ?Date of Birth: 04-16-1985 ? ?Today's Date: 08/04/2021 ?Time: SLP Start Time (ACUTE ONLY): 0845 SLP Stop Time (ACUTE ONLY): 2778 ?SLP Time Calculation (min) (ACUTE ONLY): 22 min ? ?Past Medical History:  ?Past Medical History:  ?Diagnosis Date  ? Medical history non-contributory   ? ?Past Surgical History:  ?Past Surgical History:  ?Procedure Laterality Date  ? ABDOMINAL HYSTERECTOMY  08/01/2021  ? Procedure: HYSTERECTOMY ABDOMINAL;  Surgeon: [name], MD;  Location: [facility];  Service: Gynecology;;  ? CYSTOSCOPY  08/01/2021  ? Procedure: CYSTOSCOPY;  Surgeon: [name], MD;  Location: [facility];  Service: Gynecology;;  ? DILATION AND CURETTAGE OF UTERUS N/A 08/01/2021  ? Procedure: DILATATION AND CURETTAGE;  Surgeon: [name], MD;  Location: [facility];  Service: Gynecology;  Laterality: N/A;  ? IR ANGIOGRAM SELECTIVE EACH ADDITIONAL VESSEL  08/01/2021  ? IR ANGIOGRAM SELECTIVE EACH ADDITIONAL VESSEL  08/01/2021  ? IR EMBO ART  VEN HEMORR LYMPH EXTRAV  INC GUIDE ROADMAPPING  08/01/2021  ? IR US GUIDE VASC ACCESS RIGHT  08/01/2021  ? NO PAST SURGERIES    ? RADIOLOGY WITH ANESTHESIA N/A 08/01/2021  ? Procedure: IR WITH ANESTHESIA;  Surgeon: Radiologist, Medication, MD;  Location: [facility];  Service: Radiology;  Laterality: N/A;  ? ?HPI:  ?37 year old female  [id number] at 35+0. Baby had decelerations and vacuum was used to deliver neonate. After delivery temp was 101 and brisk bright red bleeding after delivery of placenta.  Despite all interventions, patient continued to have brisk bright red bleeding and became symptomatic and hypotensive. Per chart decision was made to proceed to OR and pt had an emergency hysterectomy and left oophorectomy in the OR. Intubated 4/24-4/26. RN reported coughing with liquids yesterday and last night.  ?  ?Assessment / Plan / Recommendation  ?Clinical Impression ? Pt's suspected acute  reverse dysphagia from yesterday seems to have resolved. She denies odonophagia but states her "voice is low but better today." Yesterday she was consistently coughing with thin liquids via straw and cup per pt, cousin and Therapist, sports. Today she had subtle, immediate soft throat clear 20% of the time with cup trials thin liquids. No overt coughing and vocal quality remained clear across trials thin, puree and solid. She is intermittently drowsy and encouraged to eat/drink when alert, upright, small sips and use cup (no straws). Advised tomorrow she can use straw to see how she does. Asked RN to give meds whole in puree for today. Per cousin/RN she is scheduled to [facility]. Recommend, per MD, to continue full liquids and upgrade when allowed to regular texture. No further ST needed at this time. ?SLP Visit Diagnosis: Dysphagia, unspecified (R13.10) ?   ?Aspiration Risk ? Mild aspiration risk  ?  ?Diet Recommendation Thin liquid;Other (Comment) (full liquids per MD)  ? ?Liquid Administration via: Cup;No straw ?Medication Administration: Whole meds with puree ?Supervision: Patient able to self feed;Staff to assist with self feeding (d/t mild weakness- if needs assist) ?Compensations: Slow rate;Small sips/bites ?Postural Changes: Seated upright at 90 degrees  ?  ?Other  Recommendations Oral Care Recommendations: Oral care BID   ? ?Recommendations for follow up therapy are one component of a multi-disciplinary discharge planning process, led by the attending physician.  Recommendations may be updated based on patient status, additional functional criteria and insurance authorization. ? ?Follow up Recommendations No SLP follow up  ? ? ?  ?Assistance  Recommended at Discharge None  ?Functional Status Assessment    ?Frequency and Duration    ?  ?  ?   ? ?Prognosis    ? ?  ? ?Swallow Study   ?General Date of Onset: 08/01/21 ?HPI: 36 year old female  [phone] at 29+0. Baby had decelerations and vacuum was  used to deliver neonate. After delivery temp was 101 and brisk bright red bleeding after delivery of placenta.  Despite all interventions, patient continued to have brisk bright red bleeding and became symptomatic and hypotensive. Per chart decision was made to proceed to OR and pt had an emergency hysterectomy and left oophorectomy in the OR. Intubated 4/24-4/26. RN reported coughing with liquids yesterday and last night. ?Type of Study: Bedside Swallow Evaluation ?Previous Swallow Assessment:  (none) ?Diet Prior to this Study: Thin liquids;Other (Comment) (full liquids) ?Temperature Spikes Noted: No ?Respiratory Status: Nasal cannula ?History of Recent Intubation: Yes ?Length of Intubations (days): 2 days ?Date extubated: 08/03/21 ?Behavior/Cognition: Cooperative;Pleasant mood;Lethargic/Drowsy (drowsy) ?Oral Cavity Assessment: Within Functional Limits ?Oral Care Completed by SLP: No ?Oral Cavity - Dentition: Adequate natural dentition ?Vision: Functional for self-feeding ?Self-Feeding Abilities: Needs set up (due to drowsiness and min weakness) ?Patient Positioning: Upright in bed ?Baseline Vocal Quality: Low vocal intensity ?Volitional Cough: Weak ?Volitional Swallow: Able to elicit  ?  ?Oral/Motor/Sensory Function Overall Oral Motor/Sensory Function: Within functional limits   ?Ice Chips Ice chips: Not tested   ?Thin Liquid Thin Liquid: Impaired ?Presentation: Cup ?Oral Phase Impairments:  (none) ?Oral Phase Functional Implications:  (none) ?Pharyngeal  Phase Impairments: Throat Clearing - Immediate  ?  ?Nectar Thick Nectar Thick Liquid: Not tested   ?Honey Thick Honey Thick Liquid: Not tested   ?Puree Puree: Within functional limits   ?Solid ? ? ?  Solid: Within functional limits  ? ?  ? ?Houston Siren ?08/04/2021,9:41 AM ? ? ? ?

## 2021-08-04 NOTE — Lactation Note (Addendum)
This note was copied from a baby's chart. ?Lactation Consultation Note ? ?Patient Name: Debra Bernard ?Today's Date: 08/04/2021 ?Reason for consult: Mother's request;Follow-up assessment ?Age:36 years ?LC observed infant's eyes were yellow in color and mention this to RN.  ?See mom's MR, Mom with PPH, Medications are safe with BF see LC previous note.  ?Mom is experienced at breastfeeding she BF her 1st and 2nd child for 18 months each. ?Mom latch infant on her right breast using the football hold, infant latched with depth, LC provide full assistance with latch due to mom's being sleepy, infant BF for 15 minutes. ?Afterwards infant was pace feed by family member taking 50 mls of formula. ?Mom was set up with DEBP fitted with 27 mm breast flange, mom was pumping when LC left the room. ?Mom knows to pump every 3 hours for 15 minutes on initial setting. ?Mom's current plan: ?1- Mom will continue to BF on demand, by cues, 8 to 12+ or more times within 24 hours, skin to skin. ?2- mom will attempt to latch infant on both breast during a feeding. ?3- Mom will continue to offer formula after latching infant at breast 30+ mls per feeding. ?4- Mom will continue to pump every 3 hours for 15 minutes on initial setting.  ?Maternal Data ?  ? ?Feeding ?Mother's Current Feeding Choice: Breast Milk and Formula ? ?LATCH Score ?Latch: Grasps breast easily, tongue down, lips flanged, rhythmical sucking. ? ?Audible Swallowing: Spontaneous and intermittent ? ?Type of Nipple: Everted at rest and after stimulation ? ?Comfort (Breast/Nipple): Soft / non-tender ? ?Hold (Positioning): Full assist, staff holds infant at breast ? ?LATCH Score: 8 ? ? ?Lactation Tools Discussed/Used ?Tools: Pump ?Flange Size: 27 ?Breast pump type: Double-Electric Breast Pump ?Pump Education: Setup, frequency, and cleaning;Milk Storage ?Reason for Pumping: PPH, colostrum not present currently, infant now latching at breast on day 3 ?Pumping frequency: Mom will  pump every 3 hours for 15 minutes on inital setting, mom was using the DEBP as LC left the room. ? ?Interventions ?  ? ?Discharge ?  ? ?Consult Status ?Consult Status: Follow-up ?Date: 08/05/21 ?Follow-up type: In-patient ? ? ? ?Vicente Serene ?08/04/2021, 10:08 PM ? ? ? ?

## 2021-08-04 NOTE — Progress Notes (Signed)
? ?NAME:  Debra Bernard, MRN:  [id number], DOB:  1985-11-29, LOS: 4 ?ADMISSION DATE:  07/31/2021, CONSULTATION DATE:  08/01/2021 ?REFERRING MD: Dr. Si Raider , CHIEF COMPLAINT:  Post partum Hemorrhage / Emergent Hysterectomy/ DIC and co-agulopathy  ? ?History of Present Illness:  ?All information  obtained from medical records as patient is intubated and sedated ?36 year old female  [phone] at 40+0. Baby had decelerations and vacuum was used to deliver neonate.  Neonate  was notably warm to the touch, with dusky color and poor tone, infant handed to resuscitation / NICU team . ?After delivery maternal temp was taken and was 101 after tylenol was taken.  ?There was Brisk bright red bleeding after delivery of placenta.  Patient was given Pitocin bolus  and IM methergine given with minimal effect.  She received TXA given. Fundus remained firm but brisk bleeding noted. Sweep of uterus revealed no clots or placental fragments. Dr. Si Raider notified of bleeding and intent to place JADA, With initiation of JADA, 284m of blood immediately filled canister and brisk bright red bleeding noted around JADA device. JADA device was removed , uterus and cervix was inspected with no concerning lacerations. A second dose of TXA given at 15 minutes past first dose Despite all interventions, patient continued to have brisk bright red bleeding and became symptomatic and hypotensive. Decision was made to proceed to OR for further management of bleeding. ? ?Pt had an emergency hysterectomy and left oophorectomy in the OR. Labs confirmed DIC.  ?She has received multiple PRBCs, FFP, cryo and platelets with massive transfusion protocol.  Despite surgical exploration patient continued noted having bleeding, she was taken to IR for angiogram and right ovarian artery was embolized, patient remained intubated and sedated and was transferred to ICU for further care ? ?INR > 10, PT > 90, aPTT > 200, Fibrinogen > < 60, D dimer > 20, platelets  ? ? ?Of note  GBSP + on 3/29>> treated with PCN 4/23 ?WBC bumped from 5.9 to 16.6 overnight  ?Cord pH  was 6.9 ?Pro calcitonin 0.58 ?Creatinine 1.54/ Albumin 2.1/ AST 100/ ALT 80/ Total Bili 2.1 ?GFR 19 and Gap 19 at 1445 ? ?Pertinent  Medical History  ?[id number] ?Vaginal delivery 08/01/21 ?Hysterectomy 08/01/2021 ?No further medical history noted in EPIC ? ? ?Significant Hospital Events: ?Including procedures, antibiotic start and stop dates in addition to other pertinent events   ?08/01/2021 Admission and induction vaginal delivery of female infant who was dusky and without tone , transferred to NICU.  ?Emergent Hysterectomy 4/24 ?Massive Transfusion Protocol 10:07 4/24 ?Central Line insertion R IJ 16 cm. 4/24 ?IR for embolization 4/24 ?4/26 extubated ? ?Interim History / Subjective:  ?No events. ?Making urine. ?In fair spirits. ?Had some bleeding from R femoral sheath removal improved with prolonged pressure. ? ?Objective   ?Blood pressure 127/85, pulse 69, temperature 97.8 ?F (36.6 ?C), temperature source Oral, resp. rate 15, height '5\' 5"'$  (1.651 m), weight 92.4 kg, last menstrual period 10/25/2020, SpO2 96 %, unknown if currently breastfeeding. ?   ?Vent Mode: PSV;CPAP ?FiO2 (%):  [30 %] 30 % ?Pressure Support:  [10 cmH20] 10 cmH20  ? ?Intake/Output Summary (Last 24 hours) at 08/04/2021 0929 ?Last data filed at 08/04/2021 0800 ?Gross per 24 hour  ?Intake 231.47 ml  ?Output 6250 ml  ?Net -6018.53 ml  ? ? ?Filed Weights  ? 08/02/21 0411 08/03/21 0500 08/04/21 0500  ?Weight: 92.1 kg 95.6 kg 92.4 kg  ? ? ?Examination: ?No distress ?Anasarca improved ?R  groin dressed without induration or strikethrough ?Abd soft, +BS ? ?Cr stable ?LFTs improved ?CBC stable ? ?Resolved Hospital Problem list   ?Delivery of female infant ? ?Assessment & Plan:  ?Hemorrhagic shock- some combination of uterine and ovarian in origin post vacuum delivery for fetal distress.  No evidence of urological injury.  Required emergent hysterectomy, oophorectomy, and IR  ovarian artery ablation.  Seems to have settled out. ?Secondary acidemia, DIC, shock liver, shock kidney, acute respiratory failure, encephalopathy.  Encephalopathy/DIC improved.  Now making urine so hopefully can avoid HD. ?? HELLP- lower likelihood but meets criteria, giving a couple days empiric steroids since low risk/ high reward.  RUQ [place] looks benign by my read, formal pending. ?GBS+ ? ?- Zosyn to augmentin x 2 more days ?- DC central line ?- Labs to daily ?- Postop wound care per OBGYN ?- Avoid nephrotoxins, further diuretics per nephro ?- [facility] ?- Available PRN ? ?Erskine Emery MD PCCM ?  ?

## 2021-08-04 NOTE — Evaluation (Addendum)
Physical Therapy Evaluation ?Patient Details ?Name: Debra Bernard ?MRN: [id number] ?DOB: Mar 04, 1986 ?Today's Date: 08/04/2021 ? ?History of Present Illness ? Pt adm 4/23 for induction of labor. Pt with vaginal delivery on 2/20 complicated by post partum hemorrhage/DIC followed by emergent hysterectomy, lt salpingo-oophorectomy. Pt with emergent IR bilateral internal iliac gelfoam embolization and coiling of vessel off the rt internal iliac. Pt intubated 4/24-4/26. PMH - none applicable.  ?Clinical Impression ? Pt presents to PT with significant decline in mobility due to weakness and decr activity tolerance. Pt is young and healthy and expect she will rebound quickly but today required 2 person assist to stand and unable to take any steps. Expect it will take her 3-4 days to be able to mobilize well enough to return home. If she doesn't have big improvement by tomorrow may need to look at other options. Will also need assist of family to care for baby. ?   ? ?Recommendations for follow up therapy are one component of a multi-disciplinary discharge planning process, led by the attending physician.  Recommendations may be updated based on patient status, additional functional criteria and insurance authorization. ? ?Follow Up Recommendations Home health PT ? ?  ?Assistance Recommended at Discharge Frequent or constant Supervision/Assistance  ?Patient can return home with the following ? Two people to help with walking and/or transfers;Help with stairs or ramp for entrance;A lot of help with bathing/dressing/bathroom;Assistance with cooking/housework;Other (comment) (Assist caring for baby) ? ?  ?Equipment Recommendations Rolling walker (2 wheels);Wheelchair (measurements PT);Wheelchair cushion (measurements PT)  ?Recommendations for Other Services ?    ?  ?Functional Status Assessment Patient has had a recent decline in their functional status and demonstrates the ability to make significant improvements in function in a  reasonable and predictable amount of time.  ? ?  ?Precautions / Restrictions Precautions ?Precautions: Fall  ? ?  ? ?Mobility ? Bed Mobility ?Overal bed mobility: Needs Assistance ?Bed Mobility: Supine to Sit ?  ?  ?Supine to sit: Mod assist, HOB elevated ?  ?  ?General bed mobility comments: Assist to bring legs off of bed, elevate trunk into sitting, and bring hips to EOB. ?  ? ?Transfers ?Overall transfer level: Needs assistance ?Equipment used: Ambulation equipment used ?Transfers: Sit to/from Stand, Bed to chair/wheelchair/BSC ?Sit to Stand: +2 physical assistance, Mod assist ?  ?  ?  ?  ?  ?General transfer comment: Assist to bring hips up using bed pad. Slow to rise. Unable to stand fully erect. ?Transfer via Lift Equipment: Stedy ? ?Ambulation/Gait ?  ?  ?  ?  ?  ?  ?  ?General Gait Details: Unable ? ?Stairs ?  ?  ?  ?  ?  ? ?Wheelchair Mobility ?  ? ?Modified Rankin (Stroke Patients Only) ?  ? ?  ? ?Balance Overall balance assessment: Needs assistance ?Sitting-balance support: No upper extremity supported, Feet supported ?Sitting balance-Leahy Scale: Fair ?  ?  ?Standing balance support: Bilateral upper extremity supported, Single extremity supported ?Standing balance-Leahy Scale: Poor ?Standing balance comment: UE support and mod assist for static standing ?  ?  ?  ?  ?  ?  ?  ?  ?  ?  ?  ?   ? ? ? ?Pertinent Vitals/Pain Pain Assessment ?Pain Assessment: No/denies pain  ? ? ?Home Living Family/patient expects to be discharged to:: Private residence ?Living Arrangements: Parent ?Available Help at Discharge: Family;Available 24 hours/day ?Type of Home: Apartment ?Home Access: Stairs to enter ?  ?Entrance Stairs-Number  of Steps: 1 ?  ?Home Layout: One level ?Home Equipment: None ?   ?  ?Prior Function Prior Level of Function : Independent/Modified Independent ?  ?  ?  ?  ?  ?  ?  ?  ?  ? ? ?Hand Dominance  ?   ? ?  ?Extremity/Trunk Assessment  ? Upper Extremity Assessment ?Upper Extremity Assessment: Defer to  OT evaluation ?  ? ?Lower Extremity Assessment ?Lower Extremity Assessment: Generalized weakness ?  ? ?   ?Communication  ? Communication: Other (comment) (low volume and difficult to understand at times)  ?Cognition Arousal/Alertness: Awake/alert ?Behavior During Therapy: Flat affect ?Overall Cognitive Status: Within Functional Limits for tasks assessed ?  ?  ?  ?  ?  ?  ?  ?  ?  ?  ?  ?  ?  ?  ?  ?  ?  ?  ?  ? ?  ?General Comments   ? ?  ?Exercises    ? ?Assessment/Plan  ?  ?PT Assessment Patient needs continued PT services  ?PT Problem List Decreased strength;Decreased activity tolerance;Decreased balance;Decreased mobility ? ?   ?  ?PT Treatment Interventions DME instruction;Gait training;Stair training;Functional mobility training;Therapeutic activities;Therapeutic exercise;Balance training;Patient/family education   ? ?PT Goals (Current goals can be found in the Care Plan section)  ?Acute Rehab PT Goals ?Patient Stated Goal: not stated ?PT Goal Formulation: With patient ?Time For Goal Achievement: 08/11/21 ?Potential to Achieve Goals: Good ? ?  ?Frequency Min 3X/week ?  ? ? ?Co-evaluation   ?  ?  ?  ?  ? ? ?  ?AM-PAC PT "6 Clicks" Mobility  ?Outcome Measure Help needed turning from your back to your side while in a flat bed without using bedrails?: A Lot ?Help needed moving from lying on your back to sitting on the side of a flat bed without using bedrails?: A Lot ?Help needed moving to and from a bed to a chair (including a wheelchair)?: Total ?Help needed standing up from a chair using your arms (e.g., wheelchair or bedside chair)?: Total ?Help needed to walk in hospital room?: Total ?Help needed climbing 3-5 steps with a railing? : Total ?6 Click Score: 8 ? ?  ?End of Session   ?Activity Tolerance: Patient tolerated treatment well ?Patient left: in chair;with call bell/phone within reach;with chair alarm set ?Nurse Communication: Mobility status;Need for lift equipment ?PT Visit Diagnosis: Other  abnormalities of gait and mobility (R26.89);Muscle weakness (generalized) (M62.81) ?  ? ?Time: 6761-9509 ?PT Time Calculation (min) (ACUTE ONLY): 28 min ? ? ?Charges:   PT Evaluation ?$PT Eval Moderate Complexity: 1 Mod ?PT Treatments ?$Therapeutic Activity: 8-22 mins ?  ?   ? ? ?[facility] PT ?Acute Rehabilitation Services ?Office [phone] ? ? ?Shary Decamp [facility] ?08/04/2021, 2:59 PM ? ?

## 2021-08-04 NOTE — Progress Notes (Signed)
PHARMACY NOTE:  ANTIMICROBIAL RENAL DOSAGE ADJUSTMENT ? ?Current antimicrobial regimen includes a mismatch between antimicrobial dosage and estimated renal function.  As per policy approved by the Pharmacy & Therapeutics and Medical Executive Committees, the antimicrobial dosage will be adjusted accordingly. ? ?Current antimicrobial dosage:  Augmentin 875 mg bid ? ? ?Renal Function: ? ?Estimated Creatinine Clearance: 24.4 mL/min (A) (by C-G formula based on SCr of 3.62 mg/dL (H)). ? ?   ?Antimicrobial dosage has been changed to:  Augmentin 500 mg bid ? ? ?Thank you for allowing pharmacy to be a part of this patient's care. ? ?Corinda Gubler, [facility] ?08/04/2021 1:42 PM ?

## 2021-08-04 NOTE — Progress Notes (Signed)
Admit: 07/31/2021 ?LOS: 4 ? ?39F anuric AKI 2/2 ATN after postpartum hemorrhage, DIC, hemorrhagic shock requiring massive transfusion protocol; VDRF ? ?Subjective:  ?> 6L UOP ?Extubated, talkative today ?Heme and GI notes reviewed ?SCR downtrending, K and HCO3 ok ?Family at bedside ? ?04/26 0701 - 04/27 0700 ?In: 319.6 [I.V.:116.1; IV Piggyback:203.5] ?Out: 6250 [Urine:6250] ? ?Filed Weights  ? 08/02/21 0411 08/03/21 0500 08/04/21 0500  ?Weight: 92.1 kg 95.6 kg 92.4 kg  ? ? ?Scheduled Meds: ? amLODipine  5 mg Oral Daily  ? amoxicillin-clavulanate  1 tablet Oral Q12H  ? Chlorhexidine Gluconate Cloth  6 each Topical Daily  ? dexamethasone (DECADRON) injection  6 mg Intravenous Q6H  ? docusate sodium  100 mg Oral BID  ? famotidine  20 mg Oral Daily  ? polyethylene glycol  17 g Oral Daily  ? prenatal multivitamin  1 tablet Oral Q1200  ? sodium chloride flush  10-40 mL Intracatheter Q12H  ? Tdap  0.5 mL Intramuscular Once  ? ?Continuous Infusions: ? sodium chloride    ? ?PRN Meds:.benzocaine-Menthol, coconut oil, cyclobenzaprine, witch hazel-glycerin **AND** dibucaine, diphenhydrAMINE, fentaNYL (SUBLIMAZE) injection, labetalol, lip balm, ondansetron **OR** ondansetron (ZOFRAN) IV, oxyCODONE, simethicone, sodium chloride flush, zolpidem ? ?Current Labs: reviewed  ? ? ?Physical Exam:  Blood pressure 140/84, pulse 72, temperature 97.8 ?F (36.6 ?C), temperature source Oral, resp. rate (!) 23, height '5\' 5"'$  (1.651 m), weight 92.4 kg, last menstrual period 10/25/2020, SpO2 98 %, unknown if currently breastfeeding. ?GEN: extubated, awake, conversant ?ENT: NCAT ?EYES: EOMI ?CV: Regular, no murmur, no rub ?PULM: Coarse breath sounds bilaterally ?ABD: Mild distention ?SKIN: Incisional sites not directly visualized ?EXT: Edema as above, 3+ in the lower extremities ? ?A ?Resolving AKI 2/2 ATN 2/2 #2 ?Presumed normal baseline but not certain based on available labs ?Now in diuretic phase of recovery ?Hemorrhagic shock, resolved, per  CCM ?Postpartum hemorrhage status post MTP 08/01/21; hemoglobin has stabilized ?S/p hysterectomy, L oophorectomy 08/01/21, OB/GYN following ?S/p pelvic angio and embolization with IR 08/01/21 ?DIC, improving; ? HELLP, heme/GI think not present ?Inc LFTs prob shock related ?S/p vaginal delivery 08/01/21 ?Leukocytosis stable/improved ?VDRF, extubated 4/26 ? ?P ?Recovering GFR ?Doubtful will req RRT ?BID labs for now while UOP so high ?Medication Issues; ?Preferred narcotic agents for pain control are hydromorphone, fentanyl, and methadone. Morphine should not be used.  ?Baclofen should be avoided ?Avoid oral sodium phosphate and magnesium citrate based laxatives / bowel preps  ? ? ?Pearson Grippe MD ?08/04/2021, 11:33 AM ? ?Recent Labs  ?Lab 08/02/21 ?1700 08/03/21 ?0445 08/03/21 ?1612 08/04/21 ?0600  ?NA  --  140 142 143  ?K  --  4.5 4.1 4.4  ?CL  --  106 104 106  ?CO2  --  19* 23 26  ?GLUCOSE  --  147* 124* 146*  ?BUN  --  36* 40* 42*  ?CREATININE  --  3.88* 3.84* 3.62*  ?CALCIUM  --  7.3* 7.9* 6.7*  ?PHOS 10.0* 11.2* 9.8*  --   ? ? ?Recent Labs  ?Lab 08/01/21 ?2106 08/02/21 ?0135 08/02/21 ?0940 08/02/21 ?1630 08/03/21 ?1613 08/03/21 ?2355 08/04/21 ?0600  ?WBC 12.5*   < > 19.6*   < > 18.2* 16.0* 14.4*  ?NEUTROABS 10.4*  --  16.8*  --   --   --   --   ?HGB 9.6*   < > 7.4*   < > 9.9* 9.4* 8.8*  ?HCT 25.6*   < > 20.0*   < > 28.4* 26.4* 25.4*  ?MCV 81.8   < >  82.6   < > 86.1 85.7 85.8  ?PLT 103*   < > 124*   < > 80*  79* 77*  76* 76*  79*  ? < > = values in this interval not displayed.  ? ? ? ? ? ? ? ? ? ? ?  ?

## 2021-08-04 NOTE — Lactation Note (Addendum)
This note was copied from a baby's chart. ?Lactation Consultation Note ? ?Patient Name: Debra Bernard ?Today's Date: 08/04/2021 ?Reason for consult: Initial assessment;Mother's request;Difficult latch;Term;Breastfeeding assistance ?Age:36 days ? ?Mother meds include Calcium, Carvedilol (L3), Dexamethasone(L 3)and Famotidine (L1).  ?Infant mostly bottle feeding since Mom just arrived from the ICU. Mom expressed interest in wanting to pump and latch infant at the breast.  ?Mom sleeping during the visit, not able to take in much at this time. LC going at a slow place and just reviewing what she can comprehend.  ? ?[place] spoke with provider in Pediatrics, mom stated infant trouble with Similac provided on the floor and attempted to use powder formula. Similac Prototal comfort 20 cal/0z provided by NP as alternative. Family getting ready to bottle feed infant at the end of the [facility] visit.  ? ?Plan 1. To feed based on cues 8-12x 24hr period. Mom, when able, to offer breasts and look for signs of milk transfer.  ?2. Mom to supplement with EBM first followed by formula. 30-60 ml per feeding if infant not latching at the breast.  ?3. DEBP q 3hrs for 15 min  ?4. I and O sheet reviewed  ?All questions answered at the end of the visit.  ? ?Maternal Data ?  ? ?Feeding ?Mother's Current Feeding Choice: Breast Milk and Formula ? ?LATCH Score ?  ? ?  ? ?  ? ?  ? ?  ? ?  ? ? ?Lactation Tools Discussed/Used ?Tools: Flanges;Pump (Mom sleeping at times throughout visit with Lactation. Sister present, who is a Furniture conservator/restorer, helping with setting up the pump, learing about pump parts and usage.) ?Flange Size: 24;27 ?Breast pump type: Double-Electric Breast Pump ?Pump Education: Setup, frequency, and cleaning;Milk Storage ?Reason for Pumping: increase stimulation ?Pumping frequency: every 3 hrs for 15 min ? ?Interventions ?Interventions: Breast feeding basics reviewed;Expressed milk;Position options;Coconut oil;DEBP;Education;Pace feeding;LC  Magazine features editor;Infant Driven Feeding Algorithm education ? ?Discharge ?Pump: Hands Free;DEBP (Mom wearing electrodes and not able to use hands free bra at this time.) ?[place] Program: Yes ? ?Consult Status ?Consult Status: Follow-up ?Date: 08/05/21 ?Follow-up type: In-patient ? ? ? ?Bruce Mayers  [facility] ?08/04/2021, 5:53 PM ? ? ? ?

## 2021-08-04 NOTE — Progress Notes (Signed)
Secure chat with Solmon Ice RN re dc CVC order.  Felicia RN is aware of the order. ?

## 2021-08-04 NOTE — Progress Notes (Addendum)
? ? ? ?Daily Progress Note ? ?Hospital Day: 5 ? ?Chief Complaint: Elevated liver chemistries ? ?Brief History ?Debra Bernard is a 36 y.o. female  ? ? ?ASSESSMENT: ? ?Post-partum vaginal delivery hemorrhage ?s/p emergency hysterectomy, L oophorectomy and ultimately coil embolization right ovarian artery.  S/p massive transfusion protocol.  ? ?Elevated liver enzymes following postpartum hemorrhage requiring massive transfusion protocol. Shock liver suspected ?RUQ [place] >> gallbladder sludge, hepatic steatosis ? ?Post-partum thrombocytopenia / anemia.  ?Initially there was concern for HELLP. Hematology following . HELLP seems less likely at this point ? ?Acute dysphagia, resolved.  ?She was coughing with liquids. SLP evaluated this am. She was clear for thin liquids ? ?   ?PLAN:  ?No further GI evaluation needed at this time.  ? ? ? ?Attending Physician Note  ? ?I have taken an interval history, reviewed the chart and examined the patient. I performed a substantive portion of this encounter, including complete performance of at least one of the key components, in conjunction with the APP. I agree with the APP's note, impression and recommendations with my edits. My additional impressions and recommendations are as follows.  ? ?Shock liver post massive hemorrhage. LFTs improving. RUQ US shows gallbladder sludge, hepatic steatosis. Anticipate LFTs will steadily improve over the next few weeks. Low clinical suspicion for HELLP. Hematology following. GI signing off. GI is available if needed.   ? ?Lucio Edward, MD [facility] ?See AMION, [facility], for our on call provider  ? ? ? ?Subjective: ?Feels okay. Coughing some with fluids but improved from yesterday ? ?Objective ?Imaging:  ?IR Angiogram Selective Each Additional Vessel ? ?Result Date: 08/02/2021 ?INDICATION: Postpartum hemorrhage. Patient now presents for pelvic arteriogram and potential embolization. EXAM: 1. ULTRASOUND GUIDANCE FOR ARTERIAL ACCESS 2. FLUSH PELVIC  ARTERIOGRAM 3. SELECTIVE RIGHT INTERNAL ILIAC ARTERIOGRAM AND FLUOROSCOPIC GUIDED GEL-FOAM EMBOLIZATION 4. SELECTIVE RIGHT 5. SELECTIVE LEFT INTERNAL ILIAC ARTERIOGRAM AND FLUOROSCOPIC GUIDED GEL-FOAM EMBOLIZATION MEDICATIONS: Patient admitted to the hospital receiving intravenous antibiotics. The antibiotic was administered within one hour of the procedure ANESTHESIA/SEDATION: Patient intubated and sedated by the anesthesia team. CONTRAST:  110 cc Omnipaque 300 FLUOROSCOPY TIME:  27 minutes, 48 seconds (7,867 mGy) COMPLICATIONS: None immediate. PROCEDURE: Emergency consent was obtained prior to the initiation of the procedure. A time out was performed prior to the initiation of the procedure. Maximal barrier sterile technique utilized including caps, mask, sterile gowns, sterile gloves, large sterile drape, hand hygiene, and standard prep. The right femoral head was marked fluoroscopically. Under sterile conditions and local anesthesia, the right common femoral artery access was performed with a micropuncture needle. Under direct ultrasound guidance, the right common femoral was accessed with a micropuncture kit. An ultrasound image was saved for documentation purposes. This allowed for placement of a 5-French vascular sheath. A limited arteriogram was performed through the side arm of the sheath confirming appropriate access within the right common femoral artery. Over a Bentson wire, an Omni flush catheter was advanced to the level of the aortic bifurcation and a flush pelvic arteriogram was performed. Next, over a Bentson wire, the Omni flush catheter was exchanged for a C2 catheter which was utilized to select the ipsilateral right internal iliac artery and a selective right internal iliac arteriogram was performed. With the use of a fathom 14 microwire, a STC Renegade microcatheter was advanced into the right uterine artery and a selective right uterine arteriogram was performed. The microcatheter was advanced  to the horizontal segment of the vessel however could not be  advanced into the vertical segment of the vessel secondary to tortuosity and spasm. Next, the residual right uterine artery was percutaneously coil embolized with multiple overlapping 2 mm, 3 mm and 4 mm fibered and 9 fibered interlock coils. Multiple selective arteriograms performed during coil embolization. Ultimately, the microcatheter was retracted to the level of the proximal aspect of the residual right uterine artery and a selective right uterine arteriogram was performed. A repeat right internal iliac arteriogram was then performed via the C2 catheter and under continuous fluoroscopic guidance, the distribution of the right internal iliac artery was embolized with Gel-Foam slurry. The C2 catheter was retracted to the level of the proximal aspect of the right internal iliac artery and a post Gel-Foam embolization right internal iliac arteriogram was performed. Next, the C2 catheter was utilized to select the contralateral left internal iliac artery and a selective left internal iliac arteriogram was performed. Next, the vascular distribution of the left internal iliac artery was embolized with Gel-Foam slurry under continuous fluoroscopic guidance. The C2 catheter was retracted to the origin of the left internal iliac artery and a post embolization left internal iliac arteriogram was performed. Over a Bentson wire, the C2 catheter was exchanged for an Omni Flush catheter and a post embolization flush pelvic arteriogram was performed. 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 At this time, the procedure was terminated. All wires and catheters were removed from the patient  however the vascular sheath was secured in place within interrupted suture and maintained in place for continued hemodynamic monitoring per the ICU staff recommendation. 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 The right uterine artery was noted to be hypertrophic in comparison to the left and as such was initially targeted ultimately selected and percutaneously coil embolized. Next, the right internal iliac artery vascular distribution was embolized with Gel-Foam with post embolization arteriogram demonstrating significantly reduced flow through to the right hemipelvis (series 19). Selective left internal iliac arteriogram demonstrated near complete occlusion of the left uterine artery however given the patient's hemodynamic instability the left internal iliac artery vascular distribution was embolized with Gel-Foam. Post embolization angiogram demonstrates significantly reduced flow to the left hemipelvis (series 24). Completion flush aortogram demonstrates markedly reduced flow throughout the bilateral internal iliac artery vascular distributions. Effort was made to cannulate the origin of the tiny accessory/aberrant right ovarian artery however this ultimately proved unsuccessful however it is hopeful that this vessel was not contributing to the patient's hemodynamic instability. IMPRESSION: 1. Technically successful percutaneous coil embolization of the right uterine artery with Gel-Foam embolization of the bilateral internal iliac arteries for life-threatening postpartum hemorrhage despite supracervical hysterectomy.  2. 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 Despite prolonged efforts, this vessel was

## 2021-08-04 NOTE — Consult Note (Signed)
?History and Physical  ? ? ?Debra Bernard KPT:[id number] DOB: 05/27/1985 DOA: 07/31/2021 ? ?PCP: Constant, Peggy, MD (Confirm with patient/family/NH records and if not entered, this has to be entered at [facility] point of entry) ?Consult requested by: Dr. Si Raider ? ?I have personally briefly reviewed patient's old medical records in [place] ? ?Chief Complaint: Feeling better ? ?HPI: Debra Bernard is a 36 y.o. female with no significant medical history significant, G4P3, presented to hospital with 40+ week.  Before this, she had 3 normal pregnancy with vaginal delivery and 1 miscarriage at week 6 of pregnancy.  And she denies any family history of blood clotting problems or bleeding problems, no history of SLE or arthritis. ? ?Patient came in 4 days ago, patient had a normal vaginal delivery, and had significant bleeding during the delivery.  Several measures were adopted during delivery to stop the bleeding including TXA, IM Methergine, eventually, JADA was performed by OB MD, despite, patient continued to have active bleeding. ? ?Emergency hysterectomy on the left oophorectomy performed in the ED.  Meanwhile, lab showed picture of DIC, for which patient received PRBCs, FFP, cryo precipitate and platelets.  Even after the surgery, patient continued to have active bleeding, interventional radiology was consulted and right ovary artery was embolized and patient developed hemorrhagic shock and had to transfer to ICU.  Patient was successfully extubated 2 days ago, her labs however remains picture of DIC, HELLP, AKI. ? ? ?Review of Systems: As per HPI otherwise 14 point review of systems negative.  ? ? ?Past Medical History:  ?Diagnosis Date  ? Medical history non-contributory   ? ? ?Past Surgical History:  ?Procedure Laterality Date  ? ABDOMINAL HYSTERECTOMY  08/01/2021  ? Procedure: HYSTERECTOMY ABDOMINAL;  Surgeon: [name], MD;  Location: [facility] LD ORS;  Service: Gynecology;;  ? CYSTOSCOPY  08/01/2021  ? Procedure:  CYSTOSCOPY;  Surgeon: [name], MD;  Location: [facility] LD ORS;  Service: Gynecology;;  ? DILATION AND CURETTAGE OF UTERUS N/A 08/01/2021  ? Procedure: DILATATION AND CURETTAGE;  Surgeon: [name], MD;  Location: [facility] LD ORS;  Service: Gynecology;  Laterality: N/A;  ? IR ANGIOGRAM SELECTIVE EACH ADDITIONAL VESSEL  08/01/2021  ? IR ANGIOGRAM SELECTIVE EACH ADDITIONAL VESSEL  08/01/2021  ? IR EMBO ART  VEN HEMORR LYMPH EXTRAV  INC GUIDE ROADMAPPING  08/01/2021  ? IR US GUIDE VASC ACCESS RIGHT  08/01/2021  ? NO PAST SURGERIES    ? RADIOLOGY WITH ANESTHESIA N/A 08/01/2021  ? Procedure: IR WITH ANESTHESIA;  Surgeon: Radiologist, Medication, MD;  Location: [facility];  Service: Radiology;  Laterality: N/A;  ? ? ? reports that she has never smoked. She has never used smokeless tobacco. She reports that she does not drink alcohol and does not use drugs. ? ?No Known Allergies ? ?Family History  ?Problem Relation Age of Onset  ? Varicose Veins Father   ? Alcohol abuse Neg Hx   ? Arthritis Neg Hx   ? Asthma Neg Hx   ? Birth defects Neg Hx   ? Cancer Neg Hx   ? COPD Neg Hx   ? Depression Neg Hx   ? Diabetes Neg Hx   ? Drug abuse Neg Hx   ? Early death Neg Hx   ? Hearing loss Neg Hx   ? Hyperlipidemia Neg Hx   ? Heart disease Neg Hx   ? Hypertension Neg Hx   ? Kidney disease Neg Hx   ? Learning disabilities Neg Hx   ?  Mental illness Neg Hx   ? Mental retardation Neg Hx   ? Miscarriages / Stillbirths Neg Hx   ? Stroke Neg Hx   ? Vision loss Neg Hx   ? ? ?Prior to Admission medications   ?Medication Sig Start Date End Date Taking? Authorizing Provider  ?Blood Pressure Monitoring (BLOOD PRESSURE KIT) DEVI 1 Device by Does not apply route as needed. 02/10/21   Renee Harder, CNM  ?cyclobenzaprine (FLEXERIL) 5 MG tablet Take 1 tablet (5 mg total) by mouth 3 (three) times daily as needed for muscle spasms. 07/05/21   Luvenia Redden, PA-C  ?Doxylamine-Pyridoxine (DICLEGIS) 10-10 MG TBEC Take 1 tablet by mouth in the morning, at noon,  and at bedtime. 01/13/21   Starr Lake, CNM  ?famotidine (PEPCID) 40 MG tablet Take 1 tablet (40 mg total) by mouth daily. 06/21/21   Clarnce Flock, MD  ?hydrocortisone (ANUSOL-HC) 2.5 % rectal cream Place rectally 2 (two) times daily. 07/06/21   Anyanwu, Sallyanne Havers, MD  ?miconazole (MONISTAT 7) 2 % vaginal cream Place 1 Applicatorful vaginally at bedtime. Apply for seven nights 07/22/21   [name], MD  ?polyethylene glycol powder (GLYCOLAX/MIRALAX) 17 GM/SCOOP powder Take 17 g by mouth daily as needed. 06/21/21   Clarnce Flock, MD  ?Prenatal Vit-Fe Fumarate-FA (PREPLUS) 27-1 MG TABS Take 1 tablet by mouth daily. 03/10/21   Chancy Milroy, MD  ? ? ?Physical Exam: ?Vitals:  ? 08/04/21 0742 08/04/21 0800 08/04/21 0900 08/04/21 0952  ?BP:  127/85 130/86 140/84  ?Pulse:  69 72   ?Resp:  15 (!) 23   ?Temp: 97.8 ?F (36.6 ?C)     ?TempSrc: Oral     ?SpO2:  96% 98%   ?Weight:      ?Height:      ? ? ?Constitutional: NAD, calm, comfortable ?Vitals:  ? 08/04/21 0742 08/04/21 0800 08/04/21 0900 08/04/21 0952  ?BP:  127/85 130/86 140/84  ?Pulse:  69 72   ?Resp:  15 (!) 23   ?Temp: 97.8 ?F (36.6 ?C)     ?TempSrc: Oral     ?SpO2:  96% 98%   ?Weight:      ?Height:      ? ?Eyes: PERRL, lids and conjunctivae normal ?ENMT: Mucous membranes are moist. Posterior pharynx clear of any exudate or lesions.Normal dentition.  ?Neck: normal, supple, no masses, no thyromegaly ?Respiratory: clear to auscultation bilaterally, no wheezing, fine crackles on B/L lower fields. Normal respiratory effort. No accessory muscle use.  ?Cardiovascular: Regular rate and rhythm, no murmurs / rubs / gallops. 2+ extremity edema. 2+ pedal pulses. No carotid bruits.  ?Abdomen: no tenderness, no masses palpated. No hepatosplenomegaly. Bowel sounds positive.  ?Musculoskeletal: no clubbing / cyanosis. No joint deformity upper and lower extremities. Good ROM, no contractures. Normal muscle tone.  ?Skin: no rashes, lesions, ulcers. No  induration ?Neurologic: CN 2-12 grossly intact. Sensation intact, DTR normal. Strength 5/5 in all 4.  ?Psychiatric: Normal judgment and insight. Alert and oriented x 3. Normal mood.  ? ? ? ?Labs on Admission: I have personally reviewed following labs and imaging studies ? ?CBC: ?Recent Labs  ?Lab 08/01/21 ?2106 08/02/21 ?0135 08/02/21 ?8550 08/02/21 ?1630 08/03/21 ?0105 08/03/21 ?1586 08/03/21 ?8257 08/03/21 ?1613 08/03/21 ?2355 08/04/21 ?0600  ?WBC 12.5*   < > 19.6*   < > 15.5* 17.0*  --  18.2* 16.0* 14.4*  ?NEUTROABS 10.4*  --  16.8*  --   --   --   --   --   --   --   ?  HGB 9.6*   < > 7.4*   < > 8.9* 9.5*  --  9.9* 9.4* 8.8*  ?HCT 25.6*   < > 20.0*   < > 25.2* 28.1*  --  28.4* 26.4* 25.4*  ?MCV 81.8   < > 82.6   < > 86.0 86.7  --  86.1 85.7 85.8  ?PLT 103*   < > 124*   < > 71*  71* 83* 84* 80*  79* 77*  76* 76*  79*  ? < > = values in this interval not displayed.  ? ?Basic Metabolic Panel: ?Recent Labs  ?Lab 08/02/21 ?0135 08/02/21 ?0908 08/02/21 ?0924 08/02/21 ?1640 08/02/21 ?1700 08/03/21 ?0445 08/03/21 ?1612 08/04/21 ?0600  ?NA 138  --  139 142  --  140 142 143  ?K 4.0  --  4.5 4.6  --  4.5 4.1 4.4  ?CL 104  --   --  106  --  106 104 106  ?CO2 22  --   --  20*  --  19* 23 26  ?GLUCOSE 88  --   --  108*  --  147* 124* 146*  ?BUN 19  --   --  30*  --  36* 40* 42*  ?CREATININE 1.96*  --   --  3.45*  --  3.88* 3.84* 3.62*  ?CALCIUM 7.2*  --   --  7.5*  --  7.3* 7.9* 6.7*  ?MG 1.3* 2.8*  --   --  2.8* 2.9*  --   --   ?PHOS 7.2* 8.9*  --   --  10.0* 11.2* 9.8*  --   ? ?GFR: ?Estimated Creatinine Clearance: 24.4 mL/min (A) (by C-G formula based on SCr of 3.62 mg/dL (H)). ?Liver Function Tests: ?Recent Labs  ?Lab 08/01/21 ?1130 08/01/21 ?1446 08/02/21 ?0135 08/03/21 ?0823 08/03/21 ?1416 08/03/21 ?1612 08/04/21 ?0600  ?AST 86* 100* 310* 1,538*  --   --  1,088*  ?ALT 66* 80* 217* 1,221*  --   --  1,288*  ?ALKPHOS 64 33 29 51  --   --  71  ?BILITOT 0.8 2.1* 2.1* 1.4* 1.1  --  1.1  ?PROT 3.1* 3.6* 3.7* 4.6*  --   --   4.7*  ?ALBUMIN <1.5* 2.1* 1.9* 1.9*  --  2.1* 1.9*  ? ?No results for input(s): LIPASE, AMYLASE in the last 168 hours. ?No results for input(s): AMMONIA in the last 168 hours. ?Coagulation Profile: ?Recent Labs

## 2021-08-05 DIAGNOSIS — D6959 Other secondary thrombocytopenia: Secondary | ICD-10-CM | POA: Diagnosis not present

## 2021-08-05 DIAGNOSIS — R7989 Other specified abnormal findings of blood chemistry: Secondary | ICD-10-CM | POA: Diagnosis not present

## 2021-08-05 DIAGNOSIS — K7200 Acute and subacute hepatic failure without coma: Secondary | ICD-10-CM | POA: Diagnosis not present

## 2021-08-05 DIAGNOSIS — D62 Acute posthemorrhagic anemia: Secondary | ICD-10-CM | POA: Diagnosis not present

## 2021-08-05 DIAGNOSIS — D65 Disseminated intravascular coagulation [defibrination syndrome]: Secondary | ICD-10-CM

## 2021-08-05 DIAGNOSIS — N179 Acute kidney failure, unspecified: Secondary | ICD-10-CM | POA: Diagnosis not present

## 2021-08-05 LAB — CBC
HCT: 25.5 % — ABNORMAL LOW (ref 36.0–46.0)
Hemoglobin: 8.8 g/dL — ABNORMAL LOW (ref 12.0–15.0)
MCH: 30 pg (ref 26.0–34.0)
MCHC: 34.5 g/dL (ref 30.0–36.0)
MCV: 87 fL (ref 80.0–100.0)
Platelets: 88 K/uL — ABNORMAL LOW (ref 150–400)
RBC: 2.93 MIL/uL — ABNORMAL LOW (ref 3.87–5.11)
RDW: 15.1 % (ref 11.5–15.5)
WBC: 11.7 K/uL — ABNORMAL HIGH (ref 4.0–10.5)
nRBC: 0 % (ref 0.0–0.2)

## 2021-08-05 LAB — COMPREHENSIVE METABOLIC PANEL
ALT: 1319 U/L — ABNORMAL HIGH (ref 0–44)
AST: 727 U/L — ABNORMAL HIGH (ref 15–41)
Albumin: 2.1 g/dL — ABNORMAL LOW (ref 3.5–5.0)
Alkaline Phosphatase: 71 U/L (ref 38–126)
Anion gap: 6 (ref 5–15)
BUN: 43 mg/dL — ABNORMAL HIGH (ref 6–20)
CO2: 26 mmol/L (ref 22–32)
Calcium: 7.8 mg/dL — ABNORMAL LOW (ref 8.9–10.3)
Chloride: 107 mmol/L (ref 98–111)
Creatinine, Ser: 2.49 mg/dL — ABNORMAL HIGH (ref 0.44–1.00)
GFR, Estimated: 25 mL/min — ABNORMAL LOW (ref >60)
Glucose, Bld: 135 mg/dL — ABNORMAL HIGH (ref 70–99)
Potassium: 3.8 mmol/L (ref 3.5–5.1)
Sodium: 139 mmol/L (ref 135–145)
Total Bilirubin: 0.6 mg/dL (ref 0.3–1.2)
Total Protein: 4.8 g/dL — ABNORMAL LOW (ref 6.5–8.1)

## 2021-08-05 LAB — DIC (DISSEMINATED INTRAVASCULAR COAGULATION)PANEL
D-Dimer, Quant: >20 ug/mL-FEU — ABNORMAL HIGH (ref 0.00–0.50)
Fibrinogen: 357 mg/dL (ref 210–475)
INR: 1.1 (ref 0.8–1.2)
Platelets: 85 10*3/uL — ABNORMAL LOW (ref 150–400)
Prothrombin Time: 14.4 seconds (ref 11.4–15.2)
Smear Review: NONE SEEN
aPTT: 25 seconds (ref 24–36)

## 2021-08-05 LAB — MAGNESIUM: Magnesium: 2.6 mg/dL — ABNORMAL HIGH (ref 1.7–2.4)

## 2021-08-05 LAB — GLUCOSE, CAPILLARY
Glucose-Capillary: 123 mg/dL — ABNORMAL HIGH (ref 70–99)
Glucose-Capillary: 125 mg/dL — ABNORMAL HIGH (ref 70–99)
Glucose-Capillary: 147 mg/dL — ABNORMAL HIGH (ref 70–99)
Glucose-Capillary: 147 mg/dL — ABNORMAL HIGH (ref 70–99)
Glucose-Capillary: 153 mg/dL — ABNORMAL HIGH (ref 70–99)
Glucose-Capillary: 168 mg/dL — ABNORMAL HIGH (ref 70–99)

## 2021-08-05 NOTE — Progress Notes (Signed)
Physical Therapy Treatment ?Patient Details ?Name: Debra Bernard ?MRN: [id number] ?DOB: 10-Oct-1985 ?Today's Date: 08/05/2021 ? ? ?History of Present Illness Pt adm 4/23 for induction of labor. Pt with vaginal delivery on 2/29 complicated by post partum hemorrhage/DIC followed by emergent hysterectomy, lt salpingo-oophorectomy. Pt with emergent IR bilateral internal iliac gelfoam embolization and coiling of vessel off the rt internal iliac. Pt intubated 4/24-4/26. PMH - none applicable. ? ?  ?PT Comments  ? ? Pt admitted with above diagnosis. Pt was able to ambulate and incr distance today. Limited by some instability left > right knee however with RW able to maintain balance with +2 min assist with chair follow.  Pt states her mom cannot physically help her at home and her cousin will not be there all the time. May still need wheelchair and RW as well as HHPT so that she can safely care for baby. Will have PT see her tomorrow as well.   Pt currently with functional limitations due to balance and endurance deficits. Pt will benefit from skilled PT to increase their independence and safety with mobility to allow discharge to the venue listed below.      ?Recommendations for follow up therapy are one component of a multi-disciplinary discharge planning process, led by the attending physician.  Recommendations may be updated based on patient status, additional functional criteria and insurance authorization. ? ?Follow Up Recommendations ? Home health PT ?  ?  ?Assistance Recommended at Discharge Frequent or constant Supervision/Assistance  ?Patient can return home with the following Help with stairs or ramp for entrance;Assistance with cooking/housework;Other (comment);A little help with walking and/or transfers;A little help with bathing/dressing/bathroom (Assist caring for baby) ?  ?Equipment Recommendations ? Rolling walker (2 wheels);Wheelchair (measurements PT);Wheelchair cushion (measurements PT) (may need wheelchair  due to mom cant assist pt much)  ?  ?Recommendations for Other Services   ? ? ?  ?Precautions / Restrictions Precautions ?Precautions: Fall ?Restrictions ?Weight Bearing Restrictions: No  ?  ? ?Mobility ? Bed Mobility ?  ?  ?  ?  ?  ?  ?  ?General bed mobility comments: Pt on 3N1 on arrival.  Took incr time for pt to complete having BM. She would stand up and think she was done and clean herself and then sit back down and go some more. Pt then wanted to change mesh panties because she likes cups of water to be poured on her for cleaning and the mesh panties got wet. Told pt that PT would change mesh panties at end of rx. ?  ? ?Transfers ?Overall transfer level: Needs assistance ?Equipment used: Rolling walker (2 wheels) ?Transfers: Sit to/from Stand, Bed to chair/wheelchair/BSC ?Sit to Stand: Min assist, +2 safety/equipment ?  ?  ?  ?  ?  ?General transfer comment: Pt needed cues for hand placement but was able to stand to RW with min assist only.  Pt stood incr time to clean herself with 1UE vs no UE support statically. ?  ? ?Ambulation/Gait ?Ambulation/Gait assistance: Min assist, +2 safety/equipment ?Gait Distance (Feet): 200 Feet ?Assistive device: Rolling walker (2 wheels) ?Gait Pattern/deviations: Step-through pattern, Decreased stride length, Trunk flexed, Drifts right/left, Decreased dorsiflexion - left, Knee hyperextension - left ?  ?Gait velocity interpretation: <1.8 ft/sec, indicate of risk for recurrent falls ?  ?General Gait Details: Pt progressed ambulation today using RW. Followed with chair but pt did not end of needing to sit. Occasionally needing steadying assist due to left knee hyperextension/decr knee stability on left. Pt  also needed cues to stay close to RW for statbility. Nurse told PT to keep pt on 2LO2 even though sats were 98% on arrival on 2L.  Sats with activity to 85% once on 2L but when asked to pursed lip breathe, sats to 95%.  Other VSS.  Changed mesh panties and pad on return to  room. ? ? ?Stairs ?  ?  ?  ?  ?  ? ? ?Wheelchair Mobility ?  ? ?Modified Rankin (Stroke Patients Only) ?  ? ? ?  ?Balance Overall balance assessment: Needs assistance ?Sitting-balance support: No upper extremity supported, Feet supported ?Sitting balance-Leahy Scale: Fair ?  ?  ?Standing balance support: Bilateral upper extremity supported, Single extremity supported ?Standing balance-Leahy Scale: Poor ?Standing balance comment: UE support and min guard assist for static standing, min assist for dynamic with UE supoprt on RW ?  ?  ?  ?  ?  ?  ?  ?  ?  ?  ?  ?  ? ?  ?Cognition Arousal/Alertness: Awake/alert ?Behavior During Therapy: Flat affect ?Overall Cognitive Status: Within Functional Limits for tasks assessed ?  ?  ?  ?  ?  ?  ?  ?  ?  ?  ?  ?  ?  ?  ?  ?  ?  ?  ?  ? ?  ?Exercises   ? ?  ?General Comments General comments (skin integrity, edema, etc.): VSS with O2 on 2L at rest 98% and desat to 85% once during ambulation but recovered quickly with pursed lip breathing. ?  ?  ? ?Pertinent Vitals/Pain Pain Assessment ?Pain Assessment: Faces ?Faces Pain Scale: Hurts even more ?Pain Location: abdomen ?Pain Descriptors / Indicators: Cramping, Grimacing, Guarding ?Pain Intervention(s): Limited activity within patient's tolerance, Monitored during session, Repositioned  ? ? ?Home Living   ?  ?  ?  ?  ?  ?  ?  ?  ?  ?   ?  ?Prior Function    ?  ?  ?   ? ?PT Goals (current goals can now be found in the care plan section) Acute Rehab PT Goals ?Patient Stated Goal: not stated ?Progress towards PT goals: Progressing toward goals ? ?  ?Frequency ? ? ? Min 3X/week ? ? ? ?  ?PT Plan Current plan remains appropriate  ? ? ?Co-evaluation   ?  ?  ?  ?  ? ?  ?AM-PAC PT "6 Clicks" Mobility   ?Outcome Measure ? Help needed turning from your back to your side while in a flat bed without using bedrails?: A Lot ?Help needed moving from lying on your back to sitting on the side of a flat bed without using bedrails?: A Lot ?Help needed  moving to and from a bed to a chair (including a wheelchair)?: Total ?Help needed standing up from a chair using your arms (e.g., wheelchair or bedside chair)?: Total ?Help needed to walk in hospital room?: Total ?Help needed climbing 3-5 steps with a railing? : Total ?6 Click Score: 8 ? ?  ?End of Session Equipment Utilized During Treatment: Gait belt;Oxygen ?Activity Tolerance: Patient tolerated treatment well ?Patient left: in chair;with call bell/phone within reach;with chair alarm set;with family/visitor present ?Nurse Communication: Mobility status ?PT Visit Diagnosis: Other abnormalities of gait and mobility (R26.89);Muscle weakness (generalized) (M62.81) ?  ? ? ?Time: 9798-9211 ?PT Time Calculation (min) (ACUTE ONLY): 41 min ? ?Charges:  $Gait Training: 8-22 mins ?$Therapeutic Exercise: 8-22 mins ?$Self Care/Home Management: 8-22          ?          ? ?  Brielyn Bosak M,PT ?Acute Rehab Services ?[phone] ?[phone] (pager)  ? ? ?Alvira Philips ?08/05/2021, 4:01 PM ? ?

## 2021-08-05 NOTE — Progress Notes (Signed)
Admit: 07/31/2021 ?LOS: 5 ? ?87F anuric AKI 2/2 ATN after postpartum hemorrhage, DIC, hemorrhagic shock requiring massive transfusion protocol; VDRF ? ?Subjective:  ?1.8L yesterday from [place] ?Now on floor, baby in room ?SCR downtrending, K and HCO3 ok ?Family at bedside ? ?04/27 0701 - 04/28 0700 ?In: 255.5 [P.O.:240; I.V.:3; IV Piggyback:12.5] ?Out: [place] [[id number]] ? ?Filed Weights  ? 08/03/21 0500 08/04/21 0500 08/05/21 0500  ?Weight: 95.6 kg 92.4 kg 80.7 kg  ? ? ?Scheduled Meds: ? amoxicillin-clavulanate  500 mg Oral BID  ? calcium acetate  667 mg Oral TID WC  ? Chlorhexidine Gluconate Cloth  6 each Topical Daily  ? dexamethasone (DECADRON) injection  6 mg Intravenous Q6H  ? docusate sodium  100 mg Oral BID  ? famotidine  20 mg Oral Daily  ? feeding supplement  237 mL Oral TID BM  ? polyethylene glycol  17 g Oral Daily  ? prenatal multivitamin  1 tablet Oral Q1200  ? sodium chloride flush  10-40 mL Intracatheter Q12H  ? ?Continuous Infusions: ? sodium chloride    ? ?PRN Meds:.benzocaine-Menthol, coconut oil, cyclobenzaprine, witch hazel-glycerin **AND** dibucaine, diphenhydrAMINE, fentaNYL (SUBLIMAZE) injection, labetalol, lip balm, ondansetron **OR** ondansetron (ZOFRAN) IV, oxyCODONE, simethicone, sodium chloride flush, zolpidem ? ?Current Labs: reviewed  ? ? ?Physical Exam:  Blood pressure (!) 142/88, pulse 83, temperature 97.9 ?F (36.6 ?C), temperature source Oral, resp. rate 14, height '5\' 5"'$  (1.651 m), weight 80.7 kg, last menstrual period 10/25/2020, SpO2 (!) 87 %, unknown if currently breastfeeding. ?GEN: sitting in chair calmly ?ENT: NCAT ?EYES: EOMI ?CV: Regular, no murmur, no rub ?PULM: normal WOB on RA ?ABD: Mild distention ?SKIN: Incisional sites not directly visualized ?EXT: 1-2+ edema ? ?A ?Resolving AKI 2/2 ATN 2/2 #2 ?Presumed normal baseline but not certain based on available labs; renal US normal  ?Now coming out of diuretic phase of recovery ?Hemorrhagic shock, resolved ?Postpartum  hemorrhage status post MTP 08/01/21; hemoglobin has stabilized ?S/p hysterectomy, L oophorectomy 08/01/21, OB/GYN following ?S/p pelvic angio and embolization with IR 08/01/21 ?DIC, improving; ? HELLP, heme/GI think not present ?Inc LFTs prob shock related and improving ?S/p vaginal delivery 08/01/21 ?Leukocytosis stable/improved ?VDRF, extubated 4/26 ? ?P ?Recovering GFR ?Should return to normal; would continue to check daily labs for the next few days  ?OB can recheck BMP and UA at her post partum f/u and if not normalized in 4-6 weeks we'd be happy to see her outpt.  I will not make this appt as I doubt it'll be necessary.  D/w pt today ?Medication Issues; ?Preferred narcotic agents for pain control are hydromorphone, fentanyl, and methadone. Morphine should not be used.  ?Baclofen should be avoided ?Avoid oral sodium phosphate and magnesium citrate based laxatives / bowel preps  ? ? ?Will sign off - please page with concerns/questions.  F/u per above. ? ?Recent Labs  ?Lab 08/02/21 ?1700 08/03/21 ?0445 08/03/21 ?1612 08/04/21 ?0600 08/04/21 ?1141 08/05/21 ?0453  ?NA  --  140 142 143 142 139  ?K  --  4.5 4.1 4.4 3.8 3.8  ?CL  --  106 104 106 106 107  ?CO2  --  19* '23 26 24 26  '$ ?GLUCOSE  --  147* 124* 146* 150* 135*  ?BUN  --  36* 40* 42* 40* 43*  ?CREATININE  --  3.88* 3.84* 3.62* 3.41* 2.49*  ?CALCIUM  --  7.3* 7.9* 6.7* 7.8* 7.8*  ?PHOS 10.0* 11.2* 9.8*  --   --   --   ? ? ?  Recent Labs  ?Lab 08/01/21 ?2106 08/02/21 ?0135 08/02/21 ?0940 08/02/21 ?1630 08/03/21 ?2355 08/04/21 ?0600 08/05/21 ?0453  ?WBC 12.5*   < > 19.6*   < > 16.0* 14.4* 11.7*  ?NEUTROABS 10.4*  --  16.8*  --   --   --   --   ?HGB 9.6*   < > 7.4*   < > 9.4* 8.8* 8.8*  ?HCT 25.6*   < > 20.0*   < > 26.4* 25.4* 25.5*  ?MCV 81.8   < > 82.6   < > 85.7 85.8 87.0  ?PLT 103*   < > 124*   < > 77*  76* 76*  79* 85*  88*  ? < > = values in this interval not displayed.  ? ? ? ? ? ? ? ? ? ? ?  ?

## 2021-08-05 NOTE — Progress Notes (Signed)
Daily Obstetrics Note  ?Admission Date: 07/31/2021 ?Current Date: 08/05/2021 ?8:17 AM ? ?Debra Bernard is a 36 y.o. F7P1025 POD#4 s/p vac assissted vag delivery c/b PPH and DIC>D&C/ex-lap/supracervical hyst and LSO/cysto/IR embolizations admitted for induction of labor at 39wks for unstable lie ? ?Pregnancy complicated by: ?Patient Active Problem List  ? Diagnosis Date Noted  ? DIC (disseminated intravascular coagulation) ([place]) 08/05/2021  ? PPH (postpartum hemorrhage) 08/05/2021  ? Shock liver 08/05/2021  ? AKI (acute kidney injury) ([place]) 08/05/2021  ? Thrombocytopenia due to blood loss 08/05/2021  ? Elevated LFTs   ? Acute blood loss anemia   ? Third-stage postpartum hemorrhage, postpartum 08/01/2021  ? AMA (advanced maternal age) multigravida 38+, third trimester 12/30/2020  ? Supervision of high risk pregnancy, antepartum 12/30/2020  ? GBS (group B Streptococcus carrier), +RV culture, currently pregnant 08/19/2013  ? ? ?Overnight/24hr events:  ?Transferred from ICU to [facility] specialty care yesterday ? ?Subjective:  ?Pt resting comfortably and responsive.  ? ?Objective:  ? ? Current Vital Signs 24h Vital Sign Ranges  ?T 97.8 ?F (36.6 ?C) Temp  Avg: 98.1 ?F (36.7 ?C)  Min: 97.7 ?F (36.5 ?C)  Max: 98.6 ?F (37 ?C)  ?BP 108/70 ? BP  Min: 103/63  Max: 150/103  ?HR 60 ? Pulse  Avg: 68.8  Min: 60  Max: 102  ?RR 14 Resp  Avg: 17.3  Min: 13  Max: 30  ?SaO2 94 % Nasal Cannula SpO2  Avg: 95 %  Min: 79 %  Max: 99 %  ?    ? 24 Hour I/O Current Shift I/O  ?Time ?Ins ?Outs 04/27 0701 - 04/28 0700 ?In: 255.5 [P.O.:240; I.V.:3] ?Out: 8527 [Urine:1775] No intake/output data recorded.  ? ?Patient Vitals for the past 24 hrs: ? BP Temp Temp src Pulse Resp SpO2 Weight  ?08/05/21 0515 -- 97.8 ?F (36.6 ?C) Oral -- -- 94 % --  ?08/05/21 0500 -- -- -- -- -- -- 80.7 kg  ?08/05/21 0417 108/70 97.7 ?F (36.5 ?C) Oral 60 14 -- --  ?08/05/21 0300 -- -- -- -- -- 98 % --  ?08/05/21 0100 -- -- -- -- -- 97 % --  ?08/05/21 0005 -- -- -- -- -- 99 % --   ?08/05/21 0003 103/63 98 ?F (36.7 ?C) Oral 60 18 99 % --  ?08/04/21 2100 -- -- -- -- -- 99 % --  ?08/04/21 2024 -- -- -- -- -- 92 % --  ?08/04/21 2018 121/73 -- -- 62 15 94 % --  ?08/04/21 2016 -- 98.4 ?F (36.9 ?C) Oral -- -- -- --  ?08/04/21 1546 125/72 98.6 ?F (37 ?C) Oral 67 20 -- --  ?08/04/21 1526 -- -- -- (!) 102 (!) 30 (!) 79 % --  ?08/04/21 1515 118/82 -- -- 67 13 95 % --  ?08/04/21 1500 126/89 -- -- 68 18 95 % --  ?08/04/21 1445 122/78 -- -- 65 16 94 % --  ?08/04/21 1430 122/82 -- -- 66 18 94 % --  ?08/04/21 1415 127/81 -- -- 61 13 96 % --  ?08/04/21 1400 124/81 -- -- 69 13 97 % --  ?08/04/21 1345 128/76 -- -- 68 17 94 % --  ?08/04/21 1330 130/89 -- -- 62 14 94 % --  ?08/04/21 1315 119/79 -- -- 63 15 95 % --  ?08/04/21 1300 115/83 -- -- 63 14 95 % --  ?08/04/21 1200 139/84 -- -- 80 19 97 % --  ?08/04/21 1150 -- 98.3 ?F (36.8 ?C) Oral -- -- -- --  ?  08/04/21 1000 (!) 150/103 -- -- 84 (!) 21 94 % --  ?08/04/21 0952 140/84 -- -- -- -- -- --  ?08/04/21 0900 130/86 -- -- 72 (!) 23 98 % --  ? ? ?Physical exam: ?General: intubated. NAD ?GU:  Yellow-slight blood tinged UOP in tube. Approx 247m of UOP in bag ?Respiratory: no distress. Ctab ?CV: normal s1 and s2, no mrgs ?Abdomen: +edema, dressing c/d/I, nttp ?Ext: pt states she wore SCDs all night but just took them off ? ?Medications: ?Current Facility-Administered Medications  ?Medication Dose Route Frequency Provider Last Rate Last Admin  ? 0.9 %  sodium chloride infusion  250 mL Intravenous Continuous Ogan, OKerry Kass MD      ? amoxicillin-clavulanate (AUGMENTIN) 500-125 MG per tablet 500 mg  500 mg Oral BID WGwynne Edinger MD   500 mg at 08/04/21 2234  ? benzocaine-Menthol (DERMOPLAST) 20-0.5 % topical spray 1 application.  1 application. Topical PRN NWende Mott CNM      ? calcium acetate (PHOSLO) capsule 667 mg  667 mg Oral TID WC ZLequita Halt MD   667 mg at 08/04/21 2026  ? carvedilol (COREG) tablet 3.125 mg  3.125 mg Oral BID WC ZWynetta Fines T, MD   3.125 mg at 08/04/21 1832  ? Chlorhexidine Gluconate Cloth 2 % PADS 6 each  6 each Topical Daily CJacky Kindle MD   6 each at 08/03/21 0800  ? coconut oil  1 application. Topical PRN NWende Mott CNM      ? cyclobenzaprine (FLEXERIL) tablet 5 mg  5 mg Oral TID PRN SCandee Furbish MD   5 mg at 08/03/21 1638  ? dexamethasone (DECADRON) injection 6 mg  6 mg Intravenous Q6H SCandee Furbish MD   6 mg at 08/05/21 0505  ? witch hazel-glycerin (TUCKS) pad 1 application.  1 application. Topical PRN NWende Mott CNM      ? And  ? dibucaine (NUPERCAINAL) 1 % rectal ointment 1 application.  1 application. Rectal PRN NWende Mott CNM      ? diphenhydrAMINE (BENADRYL) 12.5 MG/5ML elixir 25 mg  25 mg Oral Q6H PRN SCandee Furbish MD      ? docusate sodium (COLACE) capsule 100 mg  100 mg Oral BID SCandee Furbish MD   100 mg at 08/04/21 2236  ? famotidine (PEPCID) tablet 20 mg  20 mg Oral Daily WGwynne Edinger MD   20 mg at 08/04/21 01540 ? feeding supplement (ENSURE ENLIVE / ENSURE PLUS) liquid 237 mL  237 mL Oral TID BM SCandee Furbish MD   237 mL at 08/04/21 2029  ? fentaNYL (SUBLIMAZE) injection 25-75 mcg  25-75 mcg Intravenous Q2H PRN SCandee Furbish MD   75 mcg at 08/03/21 1815  ? labetalol (NORMODYNE) injection 10 mg  10 mg Intravenous Q2H PRN SCandee Furbish MD   10 mg at 08/04/21 0145  ? lip balm (CARMEX) ointment   Topical PRN SCandee Furbish MD      ? ondansetron ([facility] tablet 4 mg  4 mg Oral Q4H PRN SCandee Furbish MD      ? Or  ? ondansetron ([facility] injection 4 mg  4 mg Intravenous Q4H PRN SCandee Furbish MD   4 mg at 08/03/21 2030  ? oxyCODONE (Oxy IR/ROXICODONE) immediate release tablet 5 mg  5 mg Oral Q4H PRN SCandee Furbish MD   5 mg at 08/03/21 2252  ? polyethylene glycol (  MIRALAX / GLYCOLAX) packet 17 g  17 g Oral Daily Candee Furbish, MD   17 g at 08/04/21 0962  ? prenatal multivitamin tablet 1 tablet  1 tablet Oral Q1200 Candee Furbish, MD   1 tablet at 08/04/21 1335   ? simethicone (MYLICON) chewable tablet 80 mg  80 mg Oral PRN Candee Furbish, MD      ? sodium chloride flush (NS) 0.9 % injection 10-40 mL  10-40 mL Intracatheter Q12H Candee Furbish, MD   10 mL at 08/04/21 2235  ? sodium chloride flush (NS) 0.9 % injection 10-40 mL  10-40 mL Intracatheter PRN Candee Furbish, MD      ? zolpidem Lorrin Mais) tablet 5 mg  5 mg Oral QHS PRN Candee Furbish, MD      ? ? ?Labs:  ?Recent Labs  ?Lab 08/01/21 ?2106 08/02/21 ?0135 08/02/21 ?8366 08/02/21 ?1630 08/03/21 ?2947 08/03/21 ?6546 08/03/21 ?1613 08/03/21 ?2355 08/04/21 ?0600 08/05/21 ?0453  ?WBC 12.5*   < > 19.6*   < > 17.0*  --  18.2* 16.0* 14.4* 11.7*  ?HGB 9.6*   < > 7.4*   < > 9.5*  --  9.9* 9.4* 8.8* 8.8*  ?HCT 25.6*   < > 20.0*   < > 28.1*  --  28.4* 26.4* 25.4* 25.5*  ?PLT 103*   < > 124*   < > 83* 84* 80*  79* 77*  76* 76*  79* 85*  88*  ?MCV 81.8   < > 82.6   < > 86.7  --  86.1 85.7 85.8 87.0  ?MCH 30.7   < > 30.6   < > 29.3  --  30.0 30.5 29.7 30.0  ?MCHC 37.5*   < > 37.0*   < > 33.8  --  34.9 35.6 34.6 34.5  ?RDW 14.7   < > 15.7*   < > 15.5  --  15.4 15.4 15.3 15.1  ?LYMPHSABS 1.1  --  1.6  --   --   --   --   --   --   --   ?MONOABS 0.8  --  1.1*  --   --   --   --   --   --   --   ?EOSABS 0.0  --  0.0  --   --   --   --   --   --   --   ?BASOSABS 0.0  --  0.0  --   --   --   --   --   --   --   ? < > = values in this interval not displayed.  ? ? ? ?Recent Labs  ?Lab 08/01/21 ?1130 08/01/21 ?1141 08/01/21 ?1446 08/01/21 ?1448 08/01/21 ?1555 08/01/21 ?1800 08/01/21 ?1825 08/01/21 ?2106 08/01/21 ?2135 08/02/21 ?5035 08/02/21 ?4656 08/02/21 ?8127 08/02/21 ?5170 08/02/21 ?1700 08/03/21 ?0105 08/03/21 ?0174 08/03/21 ?9449 08/03/21 ?6759 08/03/21 ?1416 08/03/21 ?1612 08/03/21 ?1613 08/03/21 ?2355 08/04/21 ?0600 08/04/21 ?1141 08/05/21 ?0453  ?NA 137   < > 142  --    < >  --    < >  --   --  138  --   --    < >  --   --  140  --   --   --  142  --   --  143 142 139  ?K 5.9*   < > 4.1  --    < >  --    < >  --   --  4.0  --    --    < >  --   --  4.5  --   --   --  4.1  --   --  4.4 3.8 3.8  ?CL 108  --  105  --   --   --   --   --   --  104  --   --    < >  --   --  106  --   --   --  104  --   --  106 106 107  ?CO2 11*  --  18*

## 2021-08-05 NOTE — Progress Notes (Signed)
?      ?                 PROGRESS NOTE ? ?      ?PATIENT DETAILS ?Name: Debra Bernard ?Age: 36 y.o. ?Sex: female ?Date of Birth: 03/10/1986 ?Admit Date: 07/31/2021 ?Admitting Physician [name], MD ?[id number], Vickii Chafe, MD ? ?Brief Summary: ?36 year old female-G4, P24-who had a normal vaginal delivery-subsequently developed severe postpartum hemorrhage-requiring emergency C-section with hysterectomy, left oophorectomy and ultimately IR consult for embolization.  She was resuscitated with multiple units of PRBC, FFP, cryo and platelets according to massive transfusion protocol.  He was subsequently transferred to the ICU intubated and sedated, she was extubated on 4/26 and subsequently transferred back to the [facility] service.  TRH service was consulted to manage her ongoing medical issues. ? ?Significant Hospital events: ?08/01/2021 Admission and induction vaginal delivery of female infant who was dusky and without tone , transferred to NICU.  ?Emergent Hysterectomy 4/24 ?Massive Transfusion Protocol 10:07 4/24 ?Central Line insertion R IJ 16 cm. 4/24 ?IR for embolization 4/24 ?4/26 extubated ?  ? ?Subjective: ?Lying comfortably in bed-denies any chest pain or shortness of breath. ? ?Objective: ?Vitals: ?Blood pressure 117/70, pulse (!) 55, temperature 97.8 ?F (36.6 ?C), temperature source Oral, resp. rate 14, height '5\' 5"'$  (1.651 m), weight 80.7 kg, last menstrual period 10/25/2020, SpO2 94 %, unknown if currently breastfeeding.  ? ?Exam: ?Gen Exam:Alert awake-not in any distress ?HEENT:atraumatic, normocephalic ?Chest: B/L clear to auscultation anteriorly ?CVS:S1S2 regular ?Abdomen:soft non tender, non distended ?Extremities:+ edema ?Neurology: Non focal ?Skin: no rash ? ?Pertinent Labs/Radiology: ? ?  Latest Ref Rng & Units 08/05/2021  ?  4:53 AM 08/04/2021  ?  6:00 AM 08/03/2021  ? 11:55 PM  ?CBC  ?WBC 4.0 - 10.5 K/uL 11.7   14.4   16.0    ?Hemoglobin 12.0 - 15.0 g/dL 8.8   8.8   9.4    ?Hematocrit 36.0 - 46.0 % 25.5    25.4   26.4    ?Platelets 150 - 400 K/uL 88    ? 85   79    ? 76   76    ? 77    ?  ?Lab Results  ?Component Value Date  ? NA 139 08/05/2021  ? K 3.8 08/05/2021  ? CL 107 08/05/2021  ? CO2 26 08/05/2021  ?  ? ? ?Assessment/Plan: ?Hemorrhagic shock with acute blood loss anemia: Due to postpartum hemorrhage-shock physiology has resolved-BP stable.  Hemoglobin stable-numerous units of PRBC transfusion.  Follow CBC periodically. ? ?DIC/shock liver/AKI ([facility]): Due to hemorrhagic shock-improving with supportive care.  Not felt to have help syndrome.  Continue to follow LFTs/renal function.  Care is mostly supportive at this point-anticipate continued improvement in LFTs/renal function over the next few days. ? ?Thrombocytopenia: Felt to be due to massive hemorrhage and resultant consumption of platelets.  Platelet counts improving-follow CBC periodically ? ?Mild volume overload: Due to fluid overload/massive transfusion protocol-no evidence of heart failure clinically-should improve with ambulation and further improvement in renal function.   Echo is stable-do not think patient requires Coreg as blood pressure is soft. ? ?Nutrition Status: ?Nutrition Problem: Increased nutrient needs ?Etiology: acute illness ?Signs/Symptoms: estimated needs ?Interventions: Ensure Enlive (each supplement provides 350kcal and 20 grams of protein), MVI ? ?BMI/Obesity/Underweight: ?Estimated body mass index is 29.62 kg/m? as calculated from the following: ?  Height as of this encounter: '5\' 5"'$  (1.651 m). ?  Weight as of this encounter: 80.7 kg.  ? ?  Code status: ?  Code Status: Full Code  ? ?DVT Prophylaxis: ?SCDs Start: 08/01/21 1058 ?Place and maintain sequential compression device Start: 08/01/21 1007 ?  ?Family Communication: Numerous family members at bedside. ? ?Disposition Plan: ?Per primary service ? ?Diet: ?Diet Order   ? ?       ?  Diet full liquid Room service appropriate? Yes; Fluid consistency: Thin  Diet effective now       ?  ? ?   ?  ? ?  ?  ? ? ?Antimicrobial agents: ?Anti-infectives (From admission, onward)  ? ? Start     Dose/Rate Route Frequency Ordered Stop  ? 08/04/21 2200  amoxicillin-clavulanate (AUGMENTIN) 500-125 MG per tablet 500 mg       ? 500 mg Oral 2 times daily 08/04/21 1342 08/06/21 2159  ? 08/04/21 1000  amoxicillin-clavulanate (AUGMENTIN) 875-125 MG per tablet 1 tablet  Status:  Discontinued       ? 1 tablet Oral Every 12 hours 08/04/21 0829 08/04/21 1342  ? 08/01/21 1334  vancomycin variable dose per unstable renal function (pharmacist dosing)  Status:  Discontinued       ?  Does not apply See admin instructions 08/01/21 1334 08/02/21 0856  ? 08/01/21 1215  piperacillin-tazobactam (ZOSYN) IVPB 3.375 g  Status:  Discontinued       ? 3.375 g ?12.5 mL/hr over 240 Minutes Intravenous Every 8 hours 08/01/21 1207 08/04/21 0828  ? 08/01/21 1215  vancomycin (VANCOREADY) IVPB 2000 mg/400 mL  Status:  Discontinued       ? 2,000 mg ?200 mL/hr over 120 Minutes Intravenous  Once 08/01/21 1209 08/02/21 0951  ? 08/01/21 0930  ceFAZolin (ANCEF) IVPB 2g/100 mL premix  Status:  Discontinued       ? 2 g ?200 mL/hr over 30 Minutes Intravenous  Once 08/01/21 0903 08/01/21 0921  ? 08/01/21 0930  ceFAZolin (ANCEF) IVPB 1 g/50 mL premix       ? 1 g ?100 mL/hr over 30 Minutes Intravenous Every 1 hr x 2 08/01/21 0921 08/01/21 1129  ? 07/31/21 1300  penicillin G potassium 3 Million Units in dextrose 38m IVPB  Status:  Discontinued       ?See Hyperspace for full Linked Orders Report.  ? 3 Million Units ?100 mL/hr over 30 Minutes Intravenous Every 4 hours 07/31/21 0812 08/01/21 1342  ? 07/31/21 0900  penicillin G potassium 5 Million Units in sodium chloride 0.9 % 250 mL IVPB       ?See Hyperspace for full Linked Orders Report.  ? 5 Million Units ?250 mL/hr over 60 Minutes Intravenous  Once 07/31/21 0474204/23/23 1010  ? ?  ? ? ? ?MEDICATIONS: ?Scheduled Meds: ? amoxicillin-clavulanate  500 mg Oral BID  ? calcium acetate  667 mg Oral TID WC  ?  carvedilol  3.125 mg Oral BID WC  ? Chlorhexidine Gluconate Cloth  6 each Topical Daily  ? dexamethasone (DECADRON) injection  6 mg Intravenous Q6H  ? docusate sodium  100 mg Oral BID  ? famotidine  20 mg Oral Daily  ? feeding supplement  237 mL Oral TID BM  ? polyethylene glycol  17 g Oral Daily  ? prenatal multivitamin  1 tablet Oral Q1200  ? sodium chloride flush  10-40 mL Intracatheter Q12H  ? ?Continuous Infusions: ? sodium chloride    ? ?PRN Meds:.benzocaine-Menthol, coconut oil, cyclobenzaprine, witch hazel-glycerin **AND** dibucaine, diphenhydrAMINE, fentaNYL (SUBLIMAZE) injection, labetalol, lip balm, ondansetron **OR** ondansetron (ZOFRAN) IV, oxyCODONE, simethicone, sodium  chloride flush, zolpidem ? ? ?I have personally reviewed following labs and imaging studies ? ?LABORATORY DATA: ?CBC: ?Recent Labs  ?Lab 08/01/21 ?2106 08/02/21 ?0135 08/02/21 ?6468 08/02/21 ?1630 08/03/21 ?0321 08/03/21 ?2248 08/03/21 ?1613 08/03/21 ?2355 08/04/21 ?0600 08/05/21 ?0453  ?WBC 12.5*   < > 19.6*   < > 17.0*  --  18.2* 16.0* 14.4* 11.7*  ?NEUTROABS 10.4*  --  16.8*  --   --   --   --   --   --   --   ?HGB 9.6*   < > 7.4*   < > 9.5*  --  9.9* 9.4* 8.8* 8.8*  ?HCT 25.6*   < > 20.0*   < > 28.1*  --  28.4* 26.4* 25.4* 25.5*  ?MCV 81.8   < > 82.6   < > 86.7  --  86.1 85.7 85.8 87.0  ?PLT 103*   < > 124*   < > 83* 84* 80*  79* 77*  76* 76*  79* 85*  88*  ? < > = values in this interval not displayed.  ? ? ?Basic Metabolic Panel: ?Recent Labs  ?Lab 08/02/21 ?0135 08/02/21 ?0908 08/02/21 ?0924 08/02/21 ?1700 08/03/21 ?0445 08/03/21 ?1612 08/04/21 ?0600 08/04/21 ?1141 08/05/21 ?0453  ?NA 138  --    < >  --  140 142 143 142 139  ?K 4.0  --    < >  --  4.5 4.1 4.4 3.8 3.8  ?CL 104  --    < >  --  106 104 106 106 107  ?CO2 22  --    < >  --  19* '23 26 24 26  '$ ?GLUCOSE 88  --    < >  --  147* 124* 146* 150* 135*  ?BUN 19  --    < >  --  36* 40* 42* 40* 43*  ?CREATININE 1.96*  --    < >  --  3.88* 3.84* 3.62* 3.41* 2.49*  ?CALCIUM  7.2*  --    < >  --  7.3* 7.9* 6.7* 7.8* 7.8*  ?MG 1.3* 2.8*  --  2.8* 2.9*  --   --   --  2.6*  ?PHOS 7.2* 8.9*  --  10.0* 11.2* 9.8*  --   --   --   ? < > = values in this interval not displayed.  ? ? ?GFR: ?Es

## 2021-08-05 NOTE — Lactation Note (Signed)
This note was copied from a baby's chart. ?Lactation Consultation Note ? ?Patient Name: Debra Bernard ?Today's Date: 08/05/2021 ?Reason for consult: Follow-up assessment;Mother's request;Term;Breastfeeding assistance ?Age:36 days ? ?Mom states infant latching for 15 min followed by formula supplementation. Mom not getting much with pumping, but is pumping q 3hrs for 15 min. ? ?Plan 1. To feed based on cues 8-12x 24hr period. Mom to offer breasts and look for signs of milk transfer.  ?2. Mom to supplement offering EBM first followed by formula with pace bottle feeding and slow flow nipple.  ?3. DEBP q 3hrs for 15 min ? ? ?All questions answered at the end of the visit.  ? ?Maternal Data ?  ? ?Feeding ?Mother's Current Feeding Choice: Breast Milk and Formula ? ?LATCH Score ?  ? ?  ? ?  ? ?  ? ?  ? ?  ? ? ?Lactation Tools Discussed/Used ?Tools: Pump;Flanges ?Breast pump type: Double-Electric Breast Pump ?Pump Education: Setup, frequency, and cleaning;Milk Storage ?Reason for Pumping: increase stimulation ?Pumping frequency: every 3 hrs for 15 min ? ?Interventions ?Interventions: Breast feeding basics reviewed;Hand express;Expressed milk;DEBP;Pace feeding;Education;LC Services brochure;Infant Driven Feeding Algorithm education ? ?Discharge ?Pump: DEBP ? ?Consult Status ?Consult Status: Follow-up ?Date: 08/06/21 ?Follow-up type: In-patient ? ? ? ?Oran Dillenburg  Nicholson-Springer ?08/05/2021, 2:39 PM ? ? ? ?

## 2021-08-06 ENCOUNTER — Encounter: Payer: Self-pay | Admitting: Obstetrics and Gynecology

## 2021-08-06 DIAGNOSIS — N179 Acute kidney failure, unspecified: Secondary | ICD-10-CM | POA: Diagnosis not present

## 2021-08-06 DIAGNOSIS — D62 Acute posthemorrhagic anemia: Secondary | ICD-10-CM | POA: Diagnosis not present

## 2021-08-06 DIAGNOSIS — R7989 Other specified abnormal findings of blood chemistry: Secondary | ICD-10-CM | POA: Diagnosis not present

## 2021-08-06 DIAGNOSIS — D65 Disseminated intravascular coagulation [defibrination syndrome]: Secondary | ICD-10-CM | POA: Diagnosis not present

## 2021-08-06 LAB — COMPREHENSIVE METABOLIC PANEL
ALT: 854 U/L — ABNORMAL HIGH (ref 0–44)
AST: 227 U/L — ABNORMAL HIGH (ref 15–41)
Albumin: 2.2 g/dL — ABNORMAL LOW (ref 3.5–5.0)
Alkaline Phosphatase: 76 U/L (ref 38–126)
Anion gap: 9 (ref 5–15)
BUN: 42 mg/dL — ABNORMAL HIGH (ref 6–20)
CO2: 22 mmol/L (ref 22–32)
Calcium: 8.3 mg/dL — ABNORMAL LOW (ref 8.9–10.3)
Chloride: 108 mmol/L (ref 98–111)
Creatinine, Ser: 1.87 mg/dL — ABNORMAL HIGH (ref 0.44–1.00)
GFR, Estimated: 36 mL/min — ABNORMAL LOW (ref >60)
Glucose, Bld: 116 mg/dL — ABNORMAL HIGH (ref 70–99)
Potassium: 3.7 mmol/L (ref 3.5–5.1)
Sodium: 139 mmol/L (ref 135–145)
Total Bilirubin: 0.7 mg/dL (ref 0.3–1.2)
Total Protein: 4.9 g/dL — ABNORMAL LOW (ref 6.5–8.1)

## 2021-08-06 LAB — GLUCOSE, CAPILLARY
Glucose-Capillary: 109 mg/dL — ABNORMAL HIGH (ref 70–99)
Glucose-Capillary: 142 mg/dL — ABNORMAL HIGH (ref 70–99)
Glucose-Capillary: 88 mg/dL (ref 70–99)
Glucose-Capillary: 99 mg/dL (ref 70–99)

## 2021-08-06 LAB — MAGNESIUM: Magnesium: 2.2 mg/dL (ref 1.7–2.4)

## 2021-08-06 LAB — CULTURE, BLOOD (ROUTINE X 2)
Culture: NO GROWTH
Culture: NO GROWTH
Special Requests: ADEQUATE
Special Requests: ADEQUATE

## 2021-08-06 LAB — DIC (DISSEMINATED INTRAVASCULAR COAGULATION)PANEL
D-Dimer, Quant: >20 ug/mL-FEU — ABNORMAL HIGH (ref 0.00–0.50)
Fibrinogen: 283 mg/dL (ref 210–475)
INR: 1.2 (ref 0.8–1.2)
Platelets: 139 10*3/uL — ABNORMAL LOW (ref 150–400)
Prothrombin Time: 15.2 seconds (ref 11.4–15.2)
Smear Review: NONE SEEN
aPTT: 23 seconds — ABNORMAL LOW (ref 24–36)

## 2021-08-06 LAB — CBC
HCT: 30.8 % — ABNORMAL LOW (ref 36.0–46.0)
Hemoglobin: 10.5 g/dL — ABNORMAL LOW (ref 12.0–15.0)
MCH: 30.3 pg (ref 26.0–34.0)
MCHC: 34.1 g/dL (ref 30.0–36.0)
MCV: 88.8 fL (ref 80.0–100.0)
Platelets: 130 10*3/uL — ABNORMAL LOW (ref 150–400)
RBC: 3.47 MIL/uL — ABNORMAL LOW (ref 3.87–5.11)
RDW: 14.5 % (ref 11.5–15.5)
WBC: 15.7 10*3/uL — ABNORMAL HIGH (ref 4.0–10.5)
nRBC: 0.2 % (ref 0.0–0.2)

## 2021-08-06 LAB — PARATHYROID HORMONE, INTACT (NO CA): PTH: 55 pg/mL (ref 15–65)

## 2021-08-06 MED ORDER — HYDROXYZINE HCL 10 MG PO TABS
10.0000 mg | ORAL_TABLET | Freq: Four times a day (QID) | ORAL | Status: DC | PRN
  Administered 2021-08-06: 10 mg via ORAL
  Filled 2021-08-06 (×2): qty 1

## 2021-08-06 MED ORDER — HYDROMORPHONE HCL 2 MG PO TABS
2.0000 mg | ORAL_TABLET | Freq: Four times a day (QID) | ORAL | Status: DC | PRN
  Administered 2021-08-06 – 2021-08-07 (×3): 2 mg via ORAL
  Administered 2021-08-07 – 2021-08-09 (×9): 4 mg via ORAL
  Administered 2021-08-10 (×2): 2 mg via ORAL
  Filled 2021-08-06: qty 1
  Filled 2021-08-06: qty 2
  Filled 2021-08-06: qty 1
  Filled 2021-08-06 (×7): qty 2
  Filled 2021-08-06 (×3): qty 1
  Filled 2021-08-06: qty 2

## 2021-08-06 NOTE — Progress Notes (Signed)
?      ?                 PROGRESS NOTE ? ?      ?PATIENT DETAILS ?Name: Debra Bernard ?Age: 36 y.o. ?Sex: female ?Date of Birth: [birth year] ?Admit Date: 07/31/2021 ?Admitting Physician [name], MD ?[id number], Vickii Chafe, MD ? ?Brief Summary: ?36 year old female-G4, P20-who had a normal vaginal delivery-subsequently developed severe postpartum hemorrhage-requiring emergency C-section with hysterectomy, left oophorectomy and ultimately IR consult for embolization.  She was resuscitated with multiple units of PRBC, FFP, cryo and platelets according to massive transfusion protocol.  He was subsequently transferred to the ICU intubated and sedated, she was extubated on 4/26 and subsequently transferred back to the [facility] service.  TRH service was consulted to manage her ongoing medical issues. ? ?Significant Hospital events: ?08/01/2021 Admission and induction vaginal delivery of female infant who was dusky and without tone , transferred to NICU.  ?Emergent Hysterectomy 4/24 ?Massive Transfusion Protocol 10:07 4/24 ?Central Line insertion R IJ 16 cm. 4/24 ?IR for embolization 4/24 ?4/26 extubated ?  ? ?Subjective: ?Awake alert-ambulated yesterday-had BM yesterday. ? ?Objective: ?Vitals: ?Blood pressure 139/80, pulse (!) 57, temperature 98.5 ?F (36.9 ?C), temperature source Oral, resp. rate 15, height '5\' 5"'$  (1.651 m), weight 90.1 kg, last menstrual period 10/25/2020, SpO2 99 %, unknown if currently breastfeeding.  ? ?Exam: ?Gen Exam:Alert awake-not in any distress ?HEENT:atraumatic, normocephalic ?Chest: B/L clear to auscultation anteriorly ?CVS:S1S2 regular ?Abdomen:soft non tender, non distended ?Extremities: Trace edema ?Neurology: Non focal ?Skin: no rash  ? ?Pertinent Labs/Radiology: ? ?  Latest Ref Rng & Units 08/06/2021  ?  5:44 AM 08/05/2021  ?  4:53 AM 08/04/2021  ?  6:00 AM  ?CBC  ?WBC 4.0 - 10.5 K/uL 15.7   11.7   14.4    ?Hemoglobin 12.0 - 15.0 g/dL 10.5   8.8   8.8    ?Hematocrit 36.0 - 46.0 % 30.8   25.5   25.4     ?Platelets 150 - 400 K/uL 130    ? 139   88    ? 85   79    ? 76    ?  ?Lab Results  ?Component Value Date  ? NA 139 08/06/2021  ? K 3.7 08/06/2021  ? CL 108 08/06/2021  ? CO2 22 08/06/2021  ? ?  ? ? ?Assessment/Plan: ?Hemorrhagic shock with acute blood loss anemia: Due to postpartum hemorrhage-shock physiology has resolved-BP stable.  Hemoglobin currently stable and slowly trending up. ? ?DIC/shock liver/AKI (ATN): Due to hemorrhagic shock-renal/liver function continue to improve.  Care is supportive-suspect renal function will continue to improve over the next few days.  Evaluated by hematology/GI-and not felt to have HELP syndrome.  Nephrology recommending repeating UA in 4 to 6 weeks to ensure resolution of proteinuria/microscopic hematuria-if they still persist-we will require outpatient nephrology evaluation. ? ?Thrombocytopenia: Felt to be due to consumption of platelets in the setting of massive hemorrhage.  Platelet counts rapidly improving-follow periodically. ? ?Mild volume overload: Already improving-only trace edema today-suspect will continue to improve without the need for any diuretic regimen.  Echo with stable EF.   ? ?S/p vaginal delivery-subsequent postpartum hemorrhage requiring emergency C-section/hysterectomy/left oophorectomy and IR consult for embolization: Wound care/surgical issues defer to primary care team.  Defer removal of Foley catheter to primary team. ? ?Nutrition Status: ?Nutrition Problem: Increased nutrient needs ?Etiology: acute illness ?Signs/Symptoms: estimated needs ?Interventions: Ensure Enlive (each supplement provides 350kcal and 20 grams of protein), MVI ? ?  BMI/Obesity/Underweight: ?Estimated body mass index is 33.05 kg/m? as calculated from the following: ?  Height as of this encounter: '5\' 5"'$  (1.651 m). ?  Weight as of this encounter: 90.1 kg.  ? ?Code status: ?  Code Status: Full Code  ? ?DVT Prophylaxis: ?SCDs Start: 08/01/21 1058 ?Place and maintain sequential  compression device Start: 08/01/21 1007 ?  ?Family Communication: Numerous family members at bedside. ? ?Disposition Plan: ?Per primary service ? ?Diet: ?Diet Order   ? ?       ?  Diet regular Room service appropriate? Yes; Fluid consistency: Thin  Diet effective now       ?  ? ?  ?  ? ?  ?  ? ? ?Antimicrobial agents: ?Anti-infectives (From admission, onward)  ? ? Start     Dose/Rate Route Frequency Ordered Stop  ? 08/04/21 2200  amoxicillin-clavulanate (AUGMENTIN) 500-125 MG per tablet 500 mg       ? 500 mg Oral 2 times daily 08/04/21 1342 08/06/21 2159  ? 08/04/21 1000  amoxicillin-clavulanate (AUGMENTIN) 875-125 MG per tablet 1 tablet  Status:  Discontinued       ? 1 tablet Oral Every 12 hours 08/04/21 0829 08/04/21 1342  ? 08/01/21 1334  vancomycin variable dose per unstable renal function (pharmacist dosing)  Status:  Discontinued       ?  Does not apply See admin instructions 08/01/21 1334 08/02/21 0856  ? 08/01/21 1215  piperacillin-tazobactam (ZOSYN) IVPB 3.375 g  Status:  Discontinued       ? 3.375 g ?12.5 mL/hr over 240 Minutes Intravenous Every 8 hours 08/01/21 1207 08/04/21 0828  ? 08/01/21 1215  vancomycin (VANCOREADY) IVPB 2000 mg/400 mL  Status:  Discontinued       ? 2,000 mg ?200 mL/hr over 120 Minutes Intravenous  Once 08/01/21 1209 08/02/21 0951  ? 08/01/21 0930  ceFAZolin (ANCEF) IVPB 2g/100 mL premix  Status:  Discontinued       ? 2 g ?200 mL/hr over 30 Minutes Intravenous  Once 08/01/21 0903 08/01/21 0921  ? 08/01/21 0930  ceFAZolin (ANCEF) IVPB 1 g/50 mL premix       ? 1 g ?100 mL/hr over 30 Minutes Intravenous Every 1 hr x 2 08/01/21 0921 08/01/21 1129  ? 07/31/21 1300  penicillin G potassium 3 Million Units in dextrose 34m IVPB  Status:  Discontinued       ?See Hyperspace for full Linked Orders Report.  ? 3 Million Units ?100 mL/hr over 30 Minutes Intravenous Every 4 hours 07/31/21 0812 08/01/21 1342  ? 07/31/21 0900  penicillin G potassium 5 Million Units in sodium chloride 0.9 % 250 mL  IVPB       ?See Hyperspace for full Linked Orders Report.  ? 5 Million Units ?250 mL/hr over 60 Minutes Intravenous  Once 07/31/21 0637804/23/23 1010  ? ?  ? ? ? ?MEDICATIONS: ?Scheduled Meds: ? amoxicillin-clavulanate  500 mg Oral BID  ? calcium acetate  667 mg Oral TID WC  ? Chlorhexidine Gluconate Cloth  6 each Topical Daily  ? docusate sodium  100 mg Oral BID  ? famotidine  20 mg Oral Daily  ? feeding supplement  237 mL Oral TID BM  ? polyethylene glycol  17 g Oral Daily  ? prenatal multivitamin  1 tablet Oral Q1200  ? sodium chloride flush  10-40 mL Intracatheter Q12H  ? ?Continuous Infusions: ? sodium chloride    ? ?PRN Meds:.benzocaine-Menthol, coconut oil, cyclobenzaprine, witch hazel-glycerin **AND** dibucaine,  diphenhydrAMINE, fentaNYL (SUBLIMAZE) injection, labetalol, lip balm, ondansetron **OR** ondansetron (ZOFRAN) IV, oxyCODONE, simethicone, sodium chloride flush, zolpidem ? ? ?I have personally reviewed following labs and imaging studies ? ?LABORATORY DATA: ?CBC: ?Recent Labs  ?Lab 08/01/21 ?2106 08/02/21 ?0135 08/02/21 ?0940 08/02/21 ?1630 08/03/21 ?1613 08/03/21 ?2355 08/04/21 ?0600 08/05/21 ?0453 08/06/21 ?5638  ?WBC 12.5*   < > 19.6*   < > 18.2* 16.0* 14.4* 11.7* 15.7*  ?NEUTROABS 10.4*  --  16.8*  --   --   --   --   --   --   ?HGB 9.6*   < > 7.4*   < > 9.9* 9.4* 8.8* 8.8* 10.5*  ?HCT 25.6*   < > 20.0*   < > 28.4* 26.4* 25.4* 25.5* 30.8*  ?MCV 81.8   < > 82.6   < > 86.1 85.7 85.8 87.0 88.8  ?PLT 103*   < > 124*   < > 80*  79* 77*  76* 76*  79* 85*  88* 139*  130*  ? < > = values in this interval not displayed.  ? ? ? ?Basic Metabolic Panel: ?Recent Labs  ?Lab 08/02/21 ?0135 08/02/21 ?0908 08/02/21 ?0924 08/02/21 ?1700 08/03/21 ?0445 08/03/21 ?1612 08/04/21 ?0600 08/04/21 ?1141 08/05/21 ?0453 08/06/21 ?9373  ?NA 138  --    < >  --  140 142 143 142 139 139  ?K 4.0  --    < >  --  4.5 4.1 4.4 3.8 3.8 3.7  ?CL 104  --    < >  --  106 104 106 106 107 108  ?CO2 22  --    < >  --  19* '23 26 24 26  22  '$ ?GLUCOSE 88  --    < >  --  147* 124* 146* 150* 135* 116*  ?BUN 19  --    < >  --  36* 40* 42* 40* 43* 42*  ?CREATININE 1.96*  --    < >  --  3.88* 3.84* 3.62* 3.41* 2.49* 1.87*  ?CALCIUM 7.2*  --    < >  --  7.

## 2021-08-06 NOTE — Progress Notes (Signed)
Daily Obstetrics Note  ?Admission Date: 07/31/2021 ?Current Date: 08/06/2021 ?9:54 AM ? ?Debra Bernard is a 36 y.o. [id number] POD#5 s/p vac assissted vag delivery c/b PPH and DIC>D&C/ex-lap/supracervical hyst and LSO/cysto/IR embolizations admitted for induction of labor at 39wks for unstable lie ? ?Pregnancy complicated by: ?Patient Active Problem List  ? Diagnosis Date Noted  ? DIC (disseminated intravascular coagulation) ([place]) 08/05/2021  ? PPH (postpartum hemorrhage) 08/05/2021  ? Shock liver 08/05/2021  ? AKI (acute kidney injury) ([place]) 08/05/2021  ? Thrombocytopenia due to blood loss 08/05/2021  ? Elevated LFTs   ? Acute blood loss anemia   ? Third-stage postpartum hemorrhage, postpartum 08/01/2021  ? AMA (advanced maternal age) multigravida 72+, third trimester 12/30/2020  ? Supervision of high risk pregnancy, antepartum 12/30/2020  ? GBS (group B Streptococcus carrier), +RV culture, currently pregnant 08/19/2013  ? ? ?Overnight/24hr events:  ?New dressing applied overnight ? ?Subjective:  ?Pt had BM and is ambulated with assistance. Pain is decently controlled. Foley still in place ? ?Objective:  ? ? Current Vital Signs 24h Vital Sign Ranges  ?T 98.5 ?F (36.9 ?C) Temp  Avg: 98.5 ?F (36.9 ?C)  Min: 97.9 ?F (36.6 ?C)  Max: 99 ?F (37.2 ?C)  ?BP 139/80 ? BP  Min: 128/78  Max: 154/79  ?HR (!) 57 ? Pulse  Avg: 61.9  Min: 56  Max: 83  ?RR 15 Resp  Avg: 18.5  Min: 15  Max: 20  ?SaO2 99 % Nasal Cannula SpO2  Avg: 99.4 %  Min: 87 %  Max: 100 %  ?    ? 24 Hour I/O Current Shift I/O  ?Time ?Ins ?Outs 04/28 0701 - 04/29 0700 ?In: 740 [P.O.:740] ?Out: 3350 [IPJAS:5053] 04/29 0701 - 04/29 1900 ?In: -  ?Out: 650 [Urine:650]  ? ?Patient Vitals for the past 24 hrs: ? BP Temp Temp src Pulse Resp SpO2 Weight  ?08/06/21 0815 139/80 98.5 ?F (36.9 ?C) Oral (!) 57 15 99 % --  ?08/06/21 0500 -- -- -- -- -- -- 90.1 kg  ?08/06/21 0418 128/78 99 ?F (37.2 ?C) Oral 60 20 100 % --  ?08/06/21 0000 -- -- -- -- -- 100 % --  ?08/05/21 2341 137/87  98.6 ?F (37 ?C) Oral (!) 56 19 -- --  ?08/05/21 2226 -- -- -- -- -- 99 % --  ?08/05/21 2200 -- -- -- -- -- 96 % --  ?08/05/21 2040 -- -- -- -- -- 100 % --  ?08/05/21 2035 -- -- -- -- -- 100 % --  ?08/05/21 2030 -- -- -- -- -- 100 % --  ?08/05/21 2025 -- -- -- -- -- 100 % --  ?08/05/21 2020 -- -- -- -- -- 100 % --  ?08/05/21 2015 -- -- -- -- -- 100 % --  ?08/05/21 2012 (!) 151/79 -- -- 60 -- -- --  ?08/05/21 2010 -- -- -- -- -- 100 % --  ?08/05/21 2005 -- -- -- -- -- 100 % --  ?08/05/21 2000 -- -- -- -- -- 100 % --  ?08/05/21 1955 -- -- -- -- -- 100 % --  ?08/05/21 1950 -- -- -- -- -- 100 % --  ?08/05/21 1945 -- -- -- -- -- 100 % --  ?08/05/21 1940 -- -- -- -- -- 100 % --  ?08/05/21 1935 -- -- -- -- -- 100 % --  ?08/05/21 1930 -- -- -- -- -- 100 % --  ?08/05/21 1925 -- -- -- -- -- 100 % --  ?08/05/21  1920 -- -- -- -- -- 99 % --  ?08/05/21 1915 -- -- -- -- -- 99 % --  ?08/05/21 1910 -- -- -- -- -- 100 % --  ?08/05/21 1905 -- -- -- -- -- 100 % --  ?08/05/21 1900 -- -- -- -- -- 100 % --  ?08/05/21 1855 -- -- -- -- -- 100 % --  ?08/05/21 1850 -- -- -- -- -- 100 % --  ?08/05/21 1845 -- -- -- -- -- 100 % --  ?08/05/21 1840 -- -- -- -- -- 99 % --  ?08/05/21 1835 -- -- -- -- -- 100 % --  ?08/05/21 1830 -- -- -- -- -- 100 % --  ?08/05/21 1825 -- -- -- -- -- 99 % --  ?08/05/21 1820 -- -- -- -- -- 98 % --  ?08/05/21 1815 -- -- -- -- -- 98 % --  ?08/05/21 1810 -- -- -- -- -- 98 % --  ?08/05/21 1805 -- -- -- -- -- 100 % --  ?08/05/21 1800 -- -- -- -- -- 100 % --  ?08/05/21 1755 -- -- -- -- -- 100 % --  ?08/05/21 1750 -- -- -- -- -- 100 % --  ?08/05/21 1745 -- -- -- -- -- 100 % --  ?08/05/21 1740 -- -- -- -- -- 100 % --  ?08/05/21 1735 -- -- -- -- -- 100 % --  ?08/05/21 1700 -- -- -- -- -- 100 % --  ?08/05/21 1655 -- -- -- -- -- 99 % --  ?08/05/21 1650 -- -- -- -- -- 100 % --  ?08/05/21 1645 -- -- -- -- -- 100 % --  ?08/05/21 1640 -- -- -- -- -- 100 % --  ?08/05/21 1635 -- -- -- -- -- 100 % --  ?08/05/21 1630 -- -- -- -- -- 100 %  --  ?08/05/21 1625 (!) 154/79 98.5 ?F (36.9 ?C) Oral (!) 59 20 100 % --  ?08/05/21 1620 -- -- -- -- -- 94 % --  ?08/05/21 1615 -- -- -- -- -- 100 % --  ?08/05/21 1610 -- -- -- -- -- 100 % --  ?08/05/21 1605 -- -- -- -- -- 100 % --  ?08/05/21 1600 -- -- -- -- -- 100 % --  ?08/05/21 1324 -- -- -- -- -- (!) 87 % --  ?08/05/21 1317 (!) 142/88 -- -- 83 -- -- --  ?08/05/21 1211 129/71 97.9 ?F (36.6 ?C) Oral (!) 58 -- 100 % --  ? ? ?Physical exam: ?General: intubated. NAD ?GU:  Yellow-slight blood tinged UOP in tube. Approx 122m of UOP in bag ?Respiratory: no distress. CTAB. 96% on RA ?CV: normal s1 and s2, no mrgs ?Abdomen: +edema, dressing c/d/I, nttp ?Ext: pt states she wore SCDs all night but just took them off ? ?Medications: ?Current Facility-Administered Medications  ?Medication Dose Route Frequency Provider Last Rate Last Admin  ? 0.9 %  sodium chloride infusion  250 mL Intravenous Continuous Ogan, OKerry Kass MD      ? amoxicillin-clavulanate (AUGMENTIN) 500-125 MG per tablet 500 mg  500 mg Oral BID WGwynne Edinger MD   500 mg at 08/05/21 2206  ? benzocaine-Menthol (DERMOPLAST) 20-0.5 % topical spray 1 application.  1 application. Topical PRN NWende Mott CNM      ? calcium acetate (PHOSLO) capsule 667 mg  667 mg Oral TID WC ZLequita Halt MD   667 mg at 08/06/21 01275 ? Chlorhexidine Gluconate Cloth 2 % PADS 6  each  6 each Topical Daily Jacky Kindle, MD   6 each at 08/03/21 0800  ? coconut oil  1 application. Topical PRN Wende Mott, CNM      ? cyclobenzaprine (FLEXERIL) tablet 5 mg  5 mg Oral TID PRN Candee Furbish, MD   5 mg at 08/03/21 1638  ? witch hazel-glycerin (TUCKS) pad 1 application.  1 application. Topical PRN Wende Mott, CNM      ? And  ? dibucaine (NUPERCAINAL) 1 % rectal ointment 1 application.  1 application. Rectal PRN Wende Mott, CNM      ? diphenhydrAMINE (BENADRYL) 12.5 MG/5ML elixir 25 mg  25 mg Oral Q6H PRN Candee Furbish, MD      ? docusate sodium (COLACE)  capsule 100 mg  100 mg Oral BID Candee Furbish, MD   100 mg at 08/05/21 1012  ? famotidine (PEPCID) tablet 20 mg  20 mg Oral Daily Gwynne Edinger, MD   20 mg at 08/05/21 1012  ? feeding supplement (ENSURE ENLIVE / ENSURE PLUS) liquid 237 mL  237 mL Oral TID BM Candee Furbish, MD   237 mL at 08/05/21 2206  ? fentaNYL (SUBLIMAZE) injection 25-75 mcg  25-75 mcg Intravenous Q2H PRN Candee Furbish, MD   75 mcg at 08/03/21 1815  ? HYDROmorphone (DILAUDID) tablet 2-4 mg  2-4 mg Oral Q6H PRN [name], MD      ? labetalol (NORMODYNE) injection 10 mg  10 mg Intravenous Q2H PRN Candee Furbish, MD   10 mg at 08/04/21 0145  ? lip balm (CARMEX) ointment   Topical PRN Candee Furbish, MD      ? ondansetron [facility]) tablet 4 mg  4 mg Oral Q4H PRN Candee Furbish, MD      ? Or  ? ondansetron [facility]) injection 4 mg  4 mg Intravenous Q4H PRN Candee Furbish, MD   4 mg at 08/03/21 2030  ? polyethylene glycol (MIRALAX / GLYCOLAX) packet 17 g  17 g Oral Daily Candee Furbish, MD   17 g at 08/04/21 7062  ? prenatal multivitamin tablet 1 tablet  1 tablet Oral Q1200 Candee Furbish, MD   1 tablet at 08/05/21 1155  ? simethicone (MYLICON) chewable tablet 80 mg  80 mg Oral PRN Candee Furbish, MD      ? sodium chloride flush (NS) 0.9 % injection 10-40 mL  10-40 mL Intracatheter Q12H Candee Furbish, MD   10 mL at 08/05/21 2207  ? sodium chloride flush (NS) 0.9 % injection 10-40 mL  10-40 mL Intracatheter PRN Candee Furbish, MD      ? zolpidem Lorrin Mais) tablet 5 mg  5 mg Oral QHS PRN Candee Furbish, MD      ? ? ?Labs:  ?Recent Labs  ?Lab 08/01/21 ?2106 08/02/21 ?0135 08/02/21 ?0940 08/02/21 ?1630 08/03/21 ?1613 08/03/21 ?2355 08/04/21 ?0600 08/05/21 ?0453 08/06/21 ?3762  ?WBC 12.5*   < > 19.6*   < > 18.2* 16.0* 14.4* 11.7* 15.7*  ?HGB 9.6*   < > 7.4*   < > 9.9* 9.4* 8.8* 8.8* 10.5*  ?HCT 25.6*   < > 20.0*   < > 28.4* 26.4* 25.4* 25.5* 30.8*  ?PLT 103*   < > 124*   < > 80*  79* 77*  76* 76*  79* 85*  88* 139*  130*  ?MCV  81.8   < > 82.6   < > 86.1 85.7  85.8 87.0 88.8  ?MCH 30.7   < > 30.6   < > 30.0 30.5 29.7 30.0 30.3  ?MCHC 37.5*   < > 37.0*   < > 34.9 35.6 34.6 34.5 34.1  ?RDW 14.7   < > 15.7*   < > 15.4 15.4 15.3 15.1

## 2021-08-06 NOTE — Progress Notes (Signed)
Physical Therapy Treatment ?Patient Details ?Name: Debra Bernard ?MRN: [id number] ?DOB: 08/08/1985 ?Today's Date: 08/06/2021 ? ? ?History of Present Illness Pt adm 4/23 for induction of labor. Pt with vaginal delivery on 6/33 complicated by post partum hemorrhage/DIC followed by emergent hysterectomy, lt salpingo-oophorectomy. Pt with emergent IR bilateral internal iliac gelfoam embolization and coiling of vessel off the rt internal iliac. Pt intubated 4/24-4/26. PMH - none applicable. ? ?  ?PT Comments  ? ? Patient fatigued from earlier walk in hallway with nursing (RN confirmed ~100 ft) and just recently returned from walking to bathroom. Ultimately, felt she needed to have another bowel movement and assisted pt to/from bathroom. Continues to need cues for safest use of RW during transfers and gait, however strength in left leg has improved and feel pt will do well with foley removed and increased ambulation. Will discuss ?need for wheelchair for home during 4/30 visit. Will plan to see in a.m. when pt less fatigued.  ?  ?Recommendations for follow up therapy are one component of a multi-disciplinary discharge planning process, led by the attending physician.  Recommendations may be updated based on patient status, additional functional criteria and insurance authorization. ? ?Follow Up Recommendations ? Home health PT ?  ?  ?Assistance Recommended at Discharge Intermittent Supervision/Assistance  ?Patient can return home with the following Help with stairs or ramp for entrance;Assistance with cooking/housework;Other (comment);A little help with walking and/or transfers;A little help with bathing/dressing/bathroom (Assist caring for baby) ?  ?Equipment Recommendations ? Rolling walker (2 wheels);Wheelchair (measurements PT);Wheelchair cushion (measurements PT) (may need wheelchair due to mom cant assist pt much)  ?  ?Recommendations for Other Services   ? ? ?  ?Precautions / Restrictions Precautions ?Precautions:  Fall ?Restrictions ?Weight Bearing Restrictions: No  ?  ? ?Mobility ? Bed Mobility ?Overal bed mobility: Needs Assistance ?Bed Mobility: Supine to Sit, Sit to Sidelying ?  ?  ?Supine to sit: HOB elevated, Min assist ?  ?Sit to sidelying: Supervision ?General bed mobility comments: instructed pt to roll onto her side and then to sit; she made it about 1/2 way to her side and then put legs over EOB and pushed up to sitting with min assist for torso ?  ? ?Transfers ?Overall transfer level: Needs assistance ?Equipment used: Rolling walker (2 wheels) ?Transfers: Sit to/from Stand ?Sit to Stand: Min guard, Supervision ?  ?  ?  ?  ?  ?General transfer comment: Pt needed cues for hand placement initially; stood twice from toilet ?  ? ?Ambulation/Gait ?Ambulation/Gait assistance: Min guard ?Gait Distance (Feet): 15 Feet (toileted; 15) ?Assistive device: Rolling walker (2 wheels) ?Gait Pattern/deviations: Step-through pattern, Decreased stride length, Trunk flexed, Drifts right/left, Knee hyperextension - left ?  ?  ?  ?General Gait Details: vc for proximity to RW and for upright posture. Reports her back hurts and encouraged to stand upright. ? ? ?Stairs ?  ?  ?  ?  ?  ? ? ?Wheelchair Mobility ?  ? ?Modified Rankin (Stroke Patients Only) ?  ? ? ?  ?Balance Overall balance assessment: Needs assistance ?Sitting-balance support: No upper extremity supported, Feet supported ?Sitting balance-Leahy Scale: Fair ?  ?  ?Standing balance support: Bilateral upper extremity supported, Single extremity supported ?Standing balance-Leahy Scale: Fair ?Standing balance comment: able to stand without UE support to adjust undergarments when toileting ?  ?  ?  ?  ?  ?  ?  ?  ?  ?  ?  ?  ? ?  ?Cognition  Arousal/Alertness: Awake/alert ?Behavior During Therapy: Flat affect ?Overall Cognitive Status: Within Functional Limits for tasks assessed ?  ?  ?  ?  ?  ?  ?  ?  ?  ?  ?  ?  ?  ?  ?  ?  ?  ?  ?  ? ?  ?Exercises   ? ?  ?General Comments General  comments (skin integrity, edema, etc.): Patient fatigued from earlier walk in hallway with RN (~100 ft) and had just recently returned from bathroom. ?  ?  ? ?Pertinent Vitals/Pain Pain Assessment ?Pain Assessment: Faces ?Faces Pain Scale: Hurts a little bit ?Pain Location: abdomen ?Pain Descriptors / Indicators: Cramping, Guarding ?Pain Intervention(s): Limited activity within patient's tolerance, Monitored during session  ? ? ?Home Living   ?  ?  ?  ?  ?  ?  ?  ?  ?  ?   ?  ?Prior Function    ?  ?  ?   ? ?PT Goals (current goals can now be found in the care plan section) Acute Rehab PT Goals ?Patient Stated Goal: not stated ?Time For Goal Achievement: 08/11/21 ?Potential to Achieve Goals: Good ?Progress towards PT goals: Progressing toward goals ? ?  ?Frequency ? ? ? Min 3X/week ? ? ? ?  ?PT Plan Current plan remains appropriate  ? ? ?Co-evaluation   ?  ?  ?  ?  ? ?  ?AM-PAC PT "6 Clicks" Mobility   ?Outcome Measure ? Help needed turning from your back to your side while in a flat bed without using bedrails?: A Little ?Help needed moving from lying on your back to sitting on the side of a flat bed without using bedrails?: A Little ?Help needed moving to and from a bed to a chair (including a wheelchair)?: A Little ?Help needed standing up from a chair using your arms (e.g., wheelchair or bedside chair)?: A Little ?Help needed to walk in hospital room?: A Little ?Help needed climbing 3-5 steps with a railing? : Total ?6 Click Score: 16 ? ?  ?End of Session   ?Activity Tolerance: Patient limited by fatigue ?Patient left: with call bell/phone within reach;with family/visitor present;in bed ?Nurse Communication: Mobility status ?PT Visit Diagnosis: Other abnormalities of gait and mobility (R26.89);Muscle weakness (generalized) (M62.81) ?  ? ? ?Time: 4193-7902 ?PT Time Calculation (min) (ACUTE ONLY): 34 min ? ?Charges:  $Gait Training: 8-22 mins          ?          ? ? ?Arby Barrette, PT ?Acute Rehabilitation Services  ?Pager  [phone] ?Office [phone] ? ? ? ?Jeanie Cooks Kyiesha Millward ?08/06/2021, 4:28 PM ? ?

## 2021-08-06 NOTE — Lactation Note (Signed)
This note was copied from a baby's chart. ?Lactation Consultation Note ? ?Patient Name: Debra Bernard ?Today's Date: 08/06/2021 ?Reason for consult: Follow-up assessment;Mother's request;Term;Breastfeeding assistance ?Age:36 days ?Mom denying pain with latching. Infant breastfeeding followed by supplementation with formula. Mom aware if infant not latching for a feeding to offer more.  ? ? ?Infant adequate weight gain, formula supplementation 40-60 ml per feeding with adequate urine and stool output.  ?Mom still pumping but not getting much, LC encouraged her to continue post pumping after latching q3 hrs for 15 min.  ? ?Mom like to continue with lactation to track her progress.  ?All questions answered at the end of the visit.  ?Maternal Data ?  ? ?Feeding ?Mother's Current Feeding Choice: Breast Milk and Formula ?Nipple Type: Slow - flow ? ?LATCH Score ?  ? ?  ? ?  ? ?  ? ?  ? ?  ? ? ?Lactation Tools Discussed/Used ?Tools: Pump;Flanges ?Breast pump type: Double-Electric Breast Pump ?Pump Education: Setup, frequency, and cleaning;Milk Storage ?Reason for Pumping: increase stimulation ?Pumping frequency: every 3 hrs for 15 min ? ?Interventions ?Interventions: Breast feeding basics reviewed;Expressed milk;Coconut oil;DEBP;Education;Infant Driven Feeding Algorithm education ? ?Discharge ?Pump: DEBP ? ?Consult Status ?Consult Status: Follow-up ?Date: 08/07/21 ?Follow-up type: In-patient ? ? ? ?Pinkey Mcjunkin  Nicholson-Springer ?08/06/2021, 11:47 AM ? ? ? ?

## 2021-08-06 NOTE — Progress Notes (Signed)
Upon assessment, honeycomb dressing saturated. Patient states it also happened earlier this morning. Dr. Kennon Rounds notified. Dressing changed to pressure. Incision showed no sign of continued bleeding. Blood in urine noted and MD aware. ?

## 2021-08-07 ENCOUNTER — Inpatient Hospital Stay (HOSPITAL_COMMUNITY): Payer: Medicaid Other

## 2021-08-07 DIAGNOSIS — D65 Disseminated intravascular coagulation [defibrination syndrome]: Secondary | ICD-10-CM | POA: Diagnosis not present

## 2021-08-07 DIAGNOSIS — N179 Acute kidney failure, unspecified: Secondary | ICD-10-CM | POA: Diagnosis not present

## 2021-08-07 DIAGNOSIS — R5082 Postprocedural fever: Secondary | ICD-10-CM | POA: Diagnosis not present

## 2021-08-07 DIAGNOSIS — R7989 Other specified abnormal findings of blood chemistry: Secondary | ICD-10-CM | POA: Diagnosis not present

## 2021-08-07 DIAGNOSIS — D62 Acute posthemorrhagic anemia: Secondary | ICD-10-CM | POA: Diagnosis not present

## 2021-08-07 LAB — MAGNESIUM: Magnesium: 1.7 mg/dL (ref 1.7–2.4)

## 2021-08-07 LAB — COMPREHENSIVE METABOLIC PANEL
ALT: 456 U/L — ABNORMAL HIGH (ref 0–44)
AST: 68 U/L — ABNORMAL HIGH (ref 15–41)
Albumin: 2 g/dL — ABNORMAL LOW (ref 3.5–5.0)
Alkaline Phosphatase: 63 U/L (ref 38–126)
Anion gap: 7 (ref 5–15)
BUN: 32 mg/dL — ABNORMAL HIGH (ref 6–20)
CO2: 23 mmol/L (ref 22–32)
Calcium: 8.1 mg/dL — ABNORMAL LOW (ref 8.9–10.3)
Chloride: 108 mmol/L (ref 98–111)
Creatinine, Ser: 1.67 mg/dL — ABNORMAL HIGH (ref 0.44–1.00)
GFR, Estimated: 41 mL/min — ABNORMAL LOW (ref >60)
Glucose, Bld: 84 mg/dL (ref 70–99)
Potassium: 3.3 mmol/L — ABNORMAL LOW (ref 3.5–5.1)
Sodium: 138 mmol/L (ref 135–145)
Total Bilirubin: 0.7 mg/dL (ref 0.3–1.2)
Total Protein: 4.5 g/dL — ABNORMAL LOW (ref 6.5–8.1)

## 2021-08-07 LAB — CBC
HCT: 29.5 % — ABNORMAL LOW (ref 36.0–46.0)
Hemoglobin: 9.8 g/dL — ABNORMAL LOW (ref 12.0–15.0)
MCH: 29.6 pg (ref 26.0–34.0)
MCHC: 33.2 g/dL (ref 30.0–36.0)
MCV: 89.1 fL (ref 80.0–100.0)
Platelets: 162 10*3/uL (ref 150–400)
RBC: 3.31 MIL/uL — ABNORMAL LOW (ref 3.87–5.11)
RDW: 14 % (ref 11.5–15.5)
WBC: 12.1 10*3/uL — ABNORMAL HIGH (ref 4.0–10.5)
nRBC: 0 % (ref 0.0–0.2)

## 2021-08-07 MED ORDER — POTASSIUM CHLORIDE CRYS ER 20 MEQ PO TBCR
40.0000 meq | EXTENDED_RELEASE_TABLET | Freq: Once | ORAL | Status: AC
Start: 2021-08-07 — End: 2021-08-07
  Administered 2021-08-07: 40 meq via ORAL
  Filled 2021-08-07: qty 2

## 2021-08-07 NOTE — Progress Notes (Signed)
?      ?                 PROGRESS NOTE ? ?      ?PATIENT DETAILS ?Name: Debra Bernard ?Age: 36 y.o. ?Sex: female ?Date of Birth: [birth year] ?Admit Date: 07/31/2021 ?Admitting Physician [name], MD ?[id number], Vickii Chafe, MD ? ?Brief Summary: ?36 year old female-G4, P71-who had a normal vaginal delivery-subsequently developed severe postpartum hemorrhage-requiring emergency C-section with hysterectomy, left oophorectomy and ultimately IR consult for embolization.  She was resuscitated with multiple units of PRBC, FFP, cryo and platelets according to massive transfusion protocol.  He was subsequently transferred to the ICU intubated and sedated, she was extubated on 4/26 and subsequently transferred back to the [facility] service.  [facility] service was consulted to manage her ongoing medical issues. ? ?Significant Hospital events: ?08/01/2021 Admission and induction vaginal delivery of female infant who was dusky and without tone , transferred to NICU.  ?Emergent Hysterectomy 4/24 ?Massive Transfusion Protocol 10:07 4/24 ?Central Line insertion R IJ 16 cm. 4/24 ?IR for embolization 4/24 ?4/26 extubated ?  ? ?Subjective: ?Awake/alert-tolerating diet-ambulating.  Not yet breast-feeding. ? ?Objective: ?Vitals: ?Blood pressure (!) 156/90, pulse 83, temperature 98.1 ?F (36.7 ?C), temperature source Oral, resp. rate 18, height '5\' 5"'$  (1.651 m), weight 88.6 kg, last menstrual period 10/25/2020, SpO2 96 %, unknown if currently breastfeeding.  ? ?Exam: ?Gen Exam:Alert awake-not in any distress ?HEENT:atraumatic, normocephalic ?Chest: B/L clear to auscultation anteriorly ?CVS:S1S2 regular ?Abdomen:soft non tender, non distended ?Extremities: Trace edema ?Neurology: Non focal ?Skin: no rash  ? ?Pertinent Labs/Radiology: ? ?  Latest Ref Rng & Units 08/07/2021  ?  5:12 AM 08/06/2021  ?  5:44 AM 08/05/2021  ?  4:53 AM  ?CBC  ?WBC 4.0 - 10.5 K/uL 12.1   15.7   11.7    ?Hemoglobin 12.0 - 15.0 g/dL 9.8   10.5   8.8    ?Hematocrit 36.0 - 46.0 % 29.5    30.8   25.5    ?Platelets 150 - 400 K/uL 162   130    ? 139   88    ? 85    ?  ?Lab Results  ?Component Value Date  ? NA 138 08/07/2021  ? K 3.3 (L) 08/07/2021  ? CL 108 08/07/2021  ? CO2 23 08/07/2021  ? ?  ? ? ?Assessment/Plan: ?Hemorrhagic shock with acute blood loss anemia: Due to postpartum hemorrhage-shock physiology has resolved-BP stable.  Hemoglobin has stabilized after multiple units of PRBC.  Continue to follow CBC.  Likely. ? ?DIC/shock liver/AKI (ATN): Due to hemorrhagic shock-renal/liver function continue to improve.  Care is supportive-suspect renal function will continue to improve over the next few days.  Evaluated by hematology/GI-and not felt to have HELP syndrome.  Nephrology recommending repeating UA in 4 to 6 weeks to ensure resolution of proteinuria/microscopic hematuria-if they still persist-we will require outpatient nephrology evaluation. ? ?Thrombocytopenia: Felt to be due to consumption of platelets in the setting of massive hemorrhage.  Platelet counts rapidly improving-follow periodically. ? ?Mild volume overload: Already improving-only trace edema today-suspect will continue to improve without the need for any diuretic regimen.  Echo with stable EF.   ? ?S/p vaginal delivery-subsequent postpartum hemorrhage requiring emergency C-section/hysterectomy/left oophorectomy and IR consult for embolization: Wound care/surgical issues defer to primary care team.  Defer removal of Foley catheter to primary team. ? ?Thank you for allowing [facility] to participate in this patient's care-no further recommendations-I will sign off.  Patient will require repeat  metabolic panel/UA done in a few weeks to ensure that her renal function/liver function/proteinuria have all resolved. ? ? ? ?Code status: ?  Code Status: Full Code  ? ?DVT Prophylaxis: ?SCDs Start: 08/01/21 1058 ?Place and maintain sequential compression device Start: 08/01/21 1007 ?  ?Family Communication: Numerous family members at  bedside. ? ?Disposition Plan: ?Per primary service ? ?Diet: ?Diet Order   ? ?       ?  Diet regular Room service appropriate? Yes; Fluid consistency: Thin  Diet effective now       ?  ? ?  ?  ? ?  ?  ? ? ?Antimicrobial agents: ?Anti-infectives (From admission, onward)  ? ? Start     Dose/Rate Route Frequency Ordered Stop  ? 08/04/21 2200  amoxicillin-clavulanate (AUGMENTIN) 500-125 MG per tablet 500 mg       ? 500 mg Oral 2 times daily 08/04/21 1342 08/06/21 1004  ? 08/04/21 1000  amoxicillin-clavulanate (AUGMENTIN) 875-125 MG per tablet 1 tablet  Status:  Discontinued       ? 1 tablet Oral Every 12 hours 08/04/21 0829 08/04/21 1342  ? 08/01/21 1334  vancomycin variable dose per unstable renal function (pharmacist dosing)  Status:  Discontinued       ?  Does not apply See admin instructions 08/01/21 1334 08/02/21 0856  ? 08/01/21 1215  piperacillin-tazobactam (ZOSYN) IVPB 3.375 g  Status:  Discontinued       ? 3.375 g ?12.5 mL/hr over 240 Minutes Intravenous Every 8 hours 08/01/21 1207 08/04/21 0828  ? 08/01/21 1215  vancomycin (VANCOREADY) IVPB 2000 mg/400 mL  Status:  Discontinued       ? 2,000 mg ?200 mL/hr over 120 Minutes Intravenous  Once 08/01/21 1209 08/02/21 0951  ? 08/01/21 0930  ceFAZolin (ANCEF) IVPB 2g/100 mL premix  Status:  Discontinued       ? 2 g ?200 mL/hr over 30 Minutes Intravenous  Once 08/01/21 0903 08/01/21 0921  ? 08/01/21 0930  ceFAZolin (ANCEF) IVPB 1 g/50 mL premix       ? 1 g ?100 mL/hr over 30 Minutes Intravenous Every 1 hr x 2 08/01/21 0921 08/01/21 1129  ? 07/31/21 1300  penicillin G potassium 3 Million Units in dextrose 46m IVPB  Status:  Discontinued       ?See Hyperspace for full Linked Orders Report.  ? 3 Million Units ?100 mL/hr over 30 Minutes Intravenous Every 4 hours 07/31/21 0812 08/01/21 1342  ? 07/31/21 0900  penicillin G potassium 5 Million Units in sodium chloride 0.9 % 250 mL IVPB       ?See Hyperspace for full Linked Orders Report.  ? 5 Million Units ?250 mL/hr over 60  Minutes Intravenous  Once 07/31/21 0812 07/31/21 1010  ? ?  ? ? ? ?MEDICATIONS: ?Scheduled Meds: ? calcium acetate  667 mg Oral TID WC  ? Chlorhexidine Gluconate Cloth  6 each Topical Daily  ? docusate sodium  100 mg Oral BID  ? famotidine  20 mg Oral Daily  ? feeding supplement  237 mL Oral TID BM  ? polyethylene glycol  17 g Oral Daily  ? prenatal multivitamin  1 tablet Oral Q1200  ? sodium chloride flush  10-40 mL Intracatheter Q12H  ? ?Continuous Infusions: ? sodium chloride    ? ?PRN Meds:.benzocaine-Menthol, coconut oil, cyclobenzaprine, witch hazel-glycerin **AND** dibucaine, diphenhydrAMINE, fentaNYL (SUBLIMAZE) injection, HYDROmorphone, hydrOXYzine, labetalol, lip balm, ondansetron **OR** ondansetron (ZOFRAN) IV, simethicone, sodium chloride flush, zolpidem ? ? ?I have  personally reviewed following labs and imaging studies ? ?LABORATORY DATA: ?CBC: ?Recent Labs  ?Lab 08/01/21 ?2106 08/02/21 ?0135 08/02/21 ?0940 08/02/21 ?1630 08/03/21 ?2355 08/04/21 ?0600 08/05/21 ?0453 08/06/21 ?0938 08/07/21 ?0512  ?WBC 12.5*   < > 19.6*   < > 16.0* 14.4* 11.7* 15.7* 12.1*  ?NEUTROABS 10.4*  --  16.8*  --   --   --   --   --   --   ?HGB 9.6*   < > 7.4*   < > 9.4* 8.8* 8.8* 10.5* 9.8*  ?HCT 25.6*   < > 20.0*   < > 26.4* 25.4* 25.5* 30.8* 29.5*  ?MCV 81.8   < > 82.6   < > 85.7 85.8 87.0 88.8 89.1  ?PLT 103*   < > 124*   < > 77*  76* 76*  79* 85*  88* 139*  130* 162  ? < > = values in this interval not displayed.  ? ? ? ?Basic Metabolic Panel: ?Recent Labs  ?Lab 08/02/21 ?0135 08/02/21 ?0908 08/02/21 ?0924 08/02/21 ?1700 08/03/21 ?0445 08/03/21 ?1612 08/04/21 ?0600 08/04/21 ?1141 08/05/21 ?0453 08/06/21 ?1829 08/07/21 ?0512  ?NA 138  --    < >  --  140 142 143 142 139 139 138  ?K 4.0  --    < >  --  4.5 4.1 4.4 3.8 3.8 3.7 3.3*  ?CL 104  --    < >  --  106 104 106 106 107 108 108  ?CO2 22  --    < >  --  19* '23 26 24 26 22 23  '$ ?GLUCOSE 88  --    < >  --  147* 124* 146* 150* 135* 116* 84  ?BUN 19  --    < >  --  36* 40*  42* 40* 43* 42* 32*  ?CREATININE 1.96*  --    < >  --  3.88* 3.84* 3.62* 3.41* 2.49* 1.87* 1.67*  ?CALCIUM 7.2*  --    < >  --  7.3* 7.9* 6.7* 7.8* 7.8* 8.3* 8.1*  ?MG 1.3* 2.8*  --  2.8* 2.9*  --   --   --  2.6* 2.2

## 2021-08-07 NOTE — Lactation Note (Signed)
This note was copied from a baby's chart. ?Lactation Consultation Note ? ?Patient Name: Debra Bernard ?Today's Date: 08/07/2021 ?Reason for consult: Follow-up assessment;Term ?Age:36 days ? ?Mom very uncomfortable when [place] entered room. Mom had 2 cousins in the room for support and RN was at bedside as well.   ? ?Mom states she has felt unwell the past two days and has been unable to pump. ? ?LC expresses understanding and listened to mom explain how previously she did try to latch and pump. ? ?[place] encouraged mom to rest and encouraged her when she feels better to call out for lactation if she desires assistance with hand expressing, latching, or pumping.  LC encouraged mom to feed back to baby even any drop or amount she may hand express.  ? ? ?Maternal Data ?  ? ?Feeding ?Mother's Current Feeding Choice: Formula ? ?LATCH Score ?  ? ?  ? ?  ? ?  ? ?  ? ?  ? ? ?Lactation Tools Discussed/Used ?  ? ?Interventions ?  ? ?Discharge ?  ? ?Consult Status ?Consult Status: Follow-up ?Date: 08/08/21 ?Follow-up type: In-patient ? ? ? ?[name] ?08/07/2021, 11:38 AM ? ? ? ?

## 2021-08-07 NOTE — Progress Notes (Signed)
Physical Therapy Treatment ?Patient Details ?Name: Debra Bernard ?MRN: [id number] ?DOB: May 06, 1985 ?Today's Date: 08/07/2021 ? ? ?History of Present Illness Pt adm 4/23 for induction of labor. Pt with vaginal delivery on 4/09 complicated by post partum hemorrhage/DIC followed by emergent hysterectomy, lt salpingo-oophorectomy. Pt with emergent IR bilateral internal iliac gelfoam embolization and coiling of vessel off the rt internal iliac. Pt intubated 4/24-4/26. new fever, and incr abdominal pain 8/11; PMH - none applicable. ? ?  ?PT Comments  ? ? Continuing work on functional mobility and activity tolerance;  Session focused on gait and transfers, specifically, to the bathroom as pt reported need to void; Able to get up to EOB via log roll technqiue, needed mod assist to elevate trunk to sit; minguard for safety to stand from bed and walk into bathroom; assisted in trying to get a clean catch sample of urine; Pt with incr abdominal pain and fever, and additional labs/imaging ordered, so ultimately ended session in the bathroom, with pt's family to assist, and Estill Bamberg, RN present;  ? ?Will attempt to follow up later today as time allows;  ?Otherwise, will follow up for PT tomorrow;  ? ?  ?Recommendations for follow up therapy are one component of a multi-disciplinary discharge planning process, led by the attending physician.  Recommendations may be updated based on patient status, additional functional criteria and insurance authorization. ? ?Follow Up Recommendations ? Home health PT ?  ?  ?Assistance Recommended at Discharge Intermittent Supervision/Assistance  ?Patient can return home with the following Help with stairs or ramp for entrance;Assistance with cooking/housework;Other (comment);A little help with walking and/or transfers;A little help with bathing/dressing/bathroom (Assist caring for baby) ?  ?Equipment Recommendations ? Rolling walker (2 wheels);Wheelchair (measurements PT);Wheelchair cushion  (measurements PT) (may need wheelchair due to mom cant assist pt much)  ?  ?Recommendations for Other Services   ? ? ?  ?Precautions / Restrictions Precautions ?Precautions: Fall  ?  ? ?Mobility ? Bed Mobility ?Overal bed mobility: Needs Assistance ?Bed Mobility: Rolling, Sidelying to Sit ?Rolling: Min assist ?Sidelying to sit: Mod assist ?  ?  ?  ?General bed mobility comments: Cues and min assist to fully roll into sidelying; Mod assist to elevate trunk sidelying to sit ?  ? ?Transfers ?Overall transfer level: Needs assistance ?Equipment used: Rolling walker (2 wheels) ?Transfers: Sit to/from Stand ?Sit to Stand: Min guard ?  ?  ?  ?  ?  ?General transfer comment: Pt needed cues for hand placement initially ?  ? ?Ambulation/Gait ?Ambulation/Gait assistance: Min assist ?Gait Distance (Feet): 15 Feet ?Assistive device: Rolling walker (2 wheels) ?Gait Pattern/deviations: Step-through pattern, Decreased step length - right, Decreased step length - left, Trunk flexed ?Gait velocity: slowed ?  ?  ?General Gait Details: vc for proximity to RW and for upright posture. More pain today, and pt moving slowly ? ? ?Stairs ?  ?  ?  ?  ?  ? ? ?Wheelchair Mobility ?  ? ?Modified Rankin (Stroke Patients Only) ?  ? ? ?  ?Balance   ?  ?Sitting balance-Leahy Scale: Fair ?  ?  ?  ?Standing balance-Leahy Scale: Fair ?Standing balance comment: able to stand without UE support to adjust undergarments when toileting, and wipe for clean catch of urine ?  ?  ?  ?  ?  ?  ?  ?  ?  ?  ?  ?  ? ?  ?Cognition Arousal/Alertness: Awake/alert ?Behavior During Therapy: Flat affect ?Overall Cognitive Status: Within Functional Limits  for tasks assessed ?  ?  ?  ?  ?  ?  ?  ?  ?  ?  ?  ?  ?  ?  ?  ?  ?  ?  ?  ? ?  ?Exercises   ? ?  ?General Comments General comments (skin integrity, edema, etc.): More pain today, was premedicated and moving slowly because of pain meds as well ?  ?  ? ?Pertinent Vitals/Pain Pain Assessment ?Pain Assessment: Faces ?Faces  Pain Scale: Hurts whole lot ?Pain Location: abdomen ?Pain Descriptors / Indicators: Guarding, Grimacing ?Pain Intervention(s): Monitored during session, Premedicated before session  ? ? ?Home Living   ?  ?  ?  ?  ?  ?  ?  ?  ?  ?   ?  ?Prior Function    ?  ?  ?   ? ?PT Goals (current goals can now be found in the care plan section) Acute Rehab PT Goals ?Patient Stated Goal: not stated, but needed to go to the bathroom ?PT Goal Formulation: With patient ?Time For Goal Achievement: 08/11/21 ?Potential to Achieve Goals: Good ?Progress towards PT goals: Progressing toward goals (slowly; limited by pain and fever/general malaise this am) ? ?  ?Frequency ? ? ? Min 3X/week ? ? ? ?  ?PT Plan Current plan remains appropriate  ? ? ?Co-evaluation   ?  ?  ?  ?  ? ?  ?AM-PAC PT "6 Clicks" Mobility   ?Outcome Measure ? Help needed turning from your back to your side while in a flat bed without using bedrails?: A Little ?Help needed moving from lying on your back to sitting on the side of a flat bed without using bedrails?: A Lot ?Help needed moving to and from a bed to a chair (including a wheelchair)?: A Little ?Help needed standing up from a chair using your arms (e.g., wheelchair or bedside chair)?: A Little ?Help needed to walk in hospital room?: A Little ?Help needed climbing 3-5 steps with a railing? : Total ?6 Click Score: 15 ? ?  ?End of Session Equipment Utilized During Treatment: Gait belt (at axillae) ?Activity Tolerance: Patient limited by fatigue;Patient limited by pain;Other (comment) (pt to get labs drawn and then to imaging) ?Patient left: with call bell/phone within reach;with nursing/sitter in room;with family/visitor present (finishing up on toilet) ?Nurse Communication: Mobility status ?PT Visit Diagnosis: Other abnormalities of gait and mobility (R26.89);Muscle weakness (generalized) (M62.81) ?  ? ? ?Time: 8264-1583 ?PT Time Calculation (min) (ACUTE ONLY): 25 min ? ?Charges:  $Gait Training: 8-22  mins ?$Therapeutic Activity: 8-22 mins          ?          ? ?Roney Marion, PT  ?Acute Rehabilitation Services ?Office [phone] ? ? ? ?Colletta [place] ?08/07/2021, 10:35 AM ? ?

## 2021-08-07 NOTE — Progress Notes (Addendum)
Daily Obstetrics Note  ?Admission Date: 07/31/2021 ?Current Date: 08/07/2021 ?9:30 AM ? ?Debra Bernard is a 36 y.o. [id number] POD#6 s/p vac assissted vag delivery c/b PPH and DIC>D&C/ex-lap/supracervical hyst and LSO/cysto/IR embolizations admitted for induction of labor at 39wks for unstable lie ? ?Pregnancy complicated by: ?Patient Active Problem List  ? Diagnosis Date Noted  ? Postoperative fever 08/07/2021  ? DIC (disseminated intravascular coagulation) ([place]) 08/05/2021  ? PPH (postpartum hemorrhage) 08/05/2021  ? Shock liver 08/05/2021  ? AKI (acute kidney injury) ([place]) 08/05/2021  ? Thrombocytopenia due to blood loss 08/05/2021  ? Elevated LFTs   ? Acute blood loss anemia   ? Third-stage postpartum hemorrhage, postpartum 08/01/2021  ? AMA (advanced maternal age) multigravida 49+, third trimester 12/30/2020  ? Supervision of high risk pregnancy, antepartum 12/30/2020  ? GBS (group B Streptococcus carrier), +RV culture, currently pregnant 08/19/2013  ? ? ?Overnight/24hr events:  ?New dressing applied overnight ?Patient just had temp to 100.8 ?Subjective:  ?No cough, sob, chest pain.  ? ?Objective:  ? ? Current Vital Signs 24h Vital Sign Ranges  ?T 98.1 ?F (36.7 ?C) Temp  Avg: 98.2 ?F (36.8 ?C)  Min: 98 ?F (36.7 ?C)  Max: 98.8 ?F (37.1 ?C)  ?BP (!) 156/90 ? BP  Min: 127/79  Max: 156/90  ?HR 83 ? Pulse  Avg: 80.8  Min: 67  Max: 91  ?RR 18 Resp  Avg: 18.4  Min: 17  Max: 20  ?SaO2 96 % Room Air SpO2  Avg: 96.8 %  Min: 95 %  Max: 100 %  ?    ? 24 Hour I/O Current Shift I/O  ?Time ?Ins ?Outs 04/29 0701 - 04/30 0700 ?In: 1774 [P.O.:1774] ?Out: 4750 [Urine:4750] No intake/output data recorded.  ? ?Patient Vitals for the past 24 hrs: ? BP Temp Temp src Pulse Resp SpO2 Weight  ?08/07/21 0611 -- -- -- -- -- -- 88.6 kg  ?08/07/21 0432 (!) 156/90 98.1 ?F (36.7 ?C) Oral 83 18 96 % --  ?08/07/21 0000 -- -- -- -- -- 95 % --  ?08/06/21 2359 131/88 98.8 ?F (37.1 ?C) Oral 91 20 -- --  ?08/06/21 2036 (!) 142/82 98 ?F (36.7 ?C) Oral 86  20 -- --  ?08/06/21 2035 -- -- -- -- -- 100 % --  ?08/06/21 1530 127/79 98.1 ?F (36.7 ?C) Oral 67 17 96 % --  ?08/06/21 1200 133/76 98.1 ?F (36.7 ?C) Oral 77 17 97 % --  ? ? ?Physical exam: ?General: intubated. NAD ?GU:  foley just removed ?Respiratory: no distress. CTAB.  ?CV: normal s1 and s2, no mrgs ?Abdomen: +edema, pressure dressing c/d/I, mild to moderately ttp ?Ext: pt states she wore SCDs all night but just took them off ? ?Medications: ?Current Facility-Administered Medications  ?Medication Dose Route Frequency Provider Last Rate Last Admin  ? 0.9 %  sodium chloride infusion  250 mL Intravenous Continuous Ogan, Okoronkwo U, MD      ? benzocaine-Menthol (DERMOPLAST) 20-0.5 % topical spray 1 application.  1 application. Topical PRN Wende Mott, CNM      ? calcium acetate (PHOSLO) capsule 667 mg  667 mg Oral TID WC Lequita Halt, MD   667 mg at 08/07/21 0827  ? Chlorhexidine Gluconate Cloth 2 % PADS 6 each  6 each Topical Daily Jacky Kindle, MD   6 each at 08/06/21 1005  ? coconut oil  1 application. Topical PRN Wende Mott, CNM      ? cyclobenzaprine (FLEXERIL) tablet 5 mg  5 mg Oral TID PRN Candee Furbish, MD   5 mg at 08/06/21 2307  ? witch hazel-glycerin (TUCKS) pad 1 application.  1 application. Topical PRN Wende Mott, CNM      ? And  ? dibucaine (NUPERCAINAL) 1 % rectal ointment 1 application.  1 application. Rectal PRN Wende Mott, CNM      ? diphenhydrAMINE (BENADRYL) 12.5 MG/5ML elixir 25 mg  25 mg Oral Q6H PRN Candee Furbish, MD      ? docusate sodium (COLACE) capsule 100 mg  100 mg Oral BID Candee Furbish, MD   100 mg at 08/05/21 1012  ? famotidine (PEPCID) tablet 20 mg  20 mg Oral Daily Gwynne Edinger, MD   20 mg at 08/06/21 1004  ? feeding supplement (ENSURE ENLIVE / ENSURE PLUS) liquid 237 mL  237 mL Oral TID BM Candee Furbish, MD   237 mL at 08/06/21 2130  ? fentaNYL (SUBLIMAZE) injection 25-75 mcg  25-75 mcg Intravenous Q2H PRN Candee Furbish, MD   50 mcg at  08/07/21 0831  ? HYDROmorphone (DILAUDID) tablet 2-4 mg  2-4 mg Oral Q6H PRN [name], MD   2 mg at 08/07/21 0114  ? hydrOXYzine (ATARAX) tablet 10 mg  10 mg Oral Q6H PRN Donnamae Jude, MD   10 mg at 08/06/21 1429  ? labetalol (NORMODYNE) injection 10 mg  10 mg Intravenous Q2H PRN Candee Furbish, MD   10 mg at 08/04/21 0145  ? lip balm (CARMEX) ointment   Topical PRN Candee Furbish, MD      ? ondansetron [facility]) tablet 4 mg  4 mg Oral Q4H PRN Candee Furbish, MD      ? Or  ? ondansetron [facility]) injection 4 mg  4 mg Intravenous Q4H PRN Candee Furbish, MD   4 mg at 08/03/21 2030  ? polyethylene glycol (MIRALAX / GLYCOLAX) packet 17 g  17 g Oral Daily Candee Furbish, MD   17 g at 08/04/21 2122  ? prenatal multivitamin tablet 1 tablet  1 tablet Oral Q1200 Candee Furbish, MD   1 tablet at 08/06/21 1200  ? simethicone (MYLICON) chewable tablet 80 mg  80 mg Oral PRN Candee Furbish, MD      ? sodium chloride flush (NS) 0.9 % injection 10-40 mL  10-40 mL Intracatheter Q12H Candee Furbish, MD   3 mL at 08/06/21 1004  ? sodium chloride flush (NS) 0.9 % injection 10-40 mL  10-40 mL Intracatheter PRN Candee Furbish, MD      ? zolpidem Lorrin Mais) tablet 5 mg  5 mg Oral QHS PRN Candee Furbish, MD   5 mg at 08/06/21 1738  ? ? ?Labs:  ? ?  Latest Ref Rng & Units 08/07/2021  ?  5:12 AM 08/06/2021  ?  5:44 AM 08/05/2021  ?  4:53 AM  ?CBC  ?WBC 4.0 - 10.5 K/uL 12.1   15.7   11.7    ?Hemoglobin 12.0 - 15.0 g/dL 9.8   10.5   8.8    ?Hematocrit 36.0 - 46.0 % 29.5   30.8   25.5    ?Platelets 150 - 400 K/uL 162   130    ? 139   88    ? 85    ? ? ?  Latest Ref Rng & Units 08/07/2021  ?  5:12 AM 08/06/2021  ?  5:44 AM 08/05/2021  ?  4:53 AM  ?  CMP  ?Glucose 70 - 99 mg/dL 84   116   135    ?BUN 6 - 20 mg/dL 32   42   43    ?Creatinine 0.44 - 1.00 mg/dL 1.67   1.87   2.49    ?Sodium 135 - 145 mmol/L 138   139   139    ?Potassium 3.5 - 5.1 mmol/L 3.3   3.7   3.8    ?Chloride 98 - 111 mmol/L 108   108   107    ?CO2 22 - 32 mmol/L '23    22   26    '$ ?Calcium 8.9 - 10.3 mg/dL 8.1   8.3   7.8    ?Total Protein 6.5 - 8.1 g/dL 4.5   4.9   4.8    ?Total Bilirubin 0.3 - 1.2 mg/dL 0.7   0.7   0.6    ?Alkaline Phos 38 - 126 U/L 63   76   71    ?AST 15 - 41 U/L 68   227   727    ?ALT 0 - 44 U/L 456   854   1,319    ? ?Micro Results ?Recent Results (from the past 240 hour(s))  ?Culture, blood (Routine X 2) w Reflex to ID Panel     Status: None  ? Collection Time: 08/01/21  2:12 PM  ? Specimen: BLOOD  ?Result Value Ref Range Status  ? Specimen Description BLOOD CENTRAL LINE  Final  ? Special Requests   Final  ?  BOTTLES DRAWN AEROBIC AND ANAEROBIC Blood Culture adequate volume  ? Culture   Final  ?  NO GROWTH 5 DAYS ?Performed at [facility], [place] [address] [place], [place] [address] ?  ? Report Status 08/06/2021 FINAL  Final  ?Culture, blood (Routine X 2) w Reflex to ID Panel     Status: None  ? Collection Time: 08/01/21  2:35 PM  ? Specimen: BLOOD  ?Result Value Ref Range Status  ? Specimen Description BLOOD CENTRAL LINE  Final  ? Special Requests   Final  ?  BOTTLES DRAWN AEROBIC AND ANAEROBIC Blood Culture adequate volume  ? Culture   Final  ?  NO GROWTH 5 DAYS ?Performed at [facility], [place] [address] [place], [place] [address] ?  ? Report Status 08/06/2021 FINAL  Final  ?MRSA Next Gen by PCR, Nasal     Status: None  ? Collection Time: 08/01/21  6:24 PM  ? Specimen: Nasal Mucosa; Nasal Swab  ?Result Value Ref Range Status  ? MRSA by PCR Next Gen NOT DETECTED NOT DETECTED Final  ?  Comment: (NOTE) ?The GeneXpert MRSA Assay (FDA approved for NASAL specimens only), ?is one component of a comprehensive MRSA colonization surveillance ?program. It is not intended to diagnose MRSA infection nor to guide ?or monitor treatment for MRSA infections. ?Test performance is not FDA approved in patients less than 2 years ?old. ?Performed at [facility], [place] [address] [place], [place] ?[address] ?  ? ?Radiology:  ?No new imaging ? ?Assessment &  Plan:  ?Pt stable ?*Postpartum/postop: 2v CXR, UCx and BCx x 2 ordered. Hold off on nsaids, apap for now and repeat temp in 92m?-Remove staples pod#7-10 ?-Hospitalist service signed off 4/29 ?*Renal: imp

## 2021-08-08 LAB — COMPREHENSIVE METABOLIC PANEL
ALT: 302 U/L — ABNORMAL HIGH (ref 0–44)
AST: 37 U/L (ref 15–41)
Albumin: 2.1 g/dL — ABNORMAL LOW (ref 3.5–5.0)
Alkaline Phosphatase: 62 U/L (ref 38–126)
Anion gap: 5 (ref 5–15)
BUN: 21 mg/dL — ABNORMAL HIGH (ref 6–20)
CO2: 23 mmol/L (ref 22–32)
Calcium: 7.8 mg/dL — ABNORMAL LOW (ref 8.9–10.3)
Chloride: 110 mmol/L (ref 98–111)
Creatinine, Ser: 1.54 mg/dL — ABNORMAL HIGH (ref 0.44–1.00)
GFR, Estimated: 45 mL/min — ABNORMAL LOW (ref >60)
Glucose, Bld: 90 mg/dL (ref 70–99)
Potassium: 3.7 mmol/L (ref 3.5–5.1)
Sodium: 138 mmol/L (ref 135–145)
Total Bilirubin: 0.8 mg/dL (ref 0.3–1.2)
Total Protein: 4.7 g/dL — ABNORMAL LOW (ref 6.5–8.1)

## 2021-08-08 LAB — URINE CULTURE: Culture: 30000 — AB

## 2021-08-08 LAB — PHOSPHORUS: Phosphorus: 4 mg/dL (ref 2.5–4.6)

## 2021-08-08 LAB — CBC
HCT: 28.6 % — ABNORMAL LOW (ref 36.0–46.0)
Hemoglobin: 9.8 g/dL — ABNORMAL LOW (ref 12.0–15.0)
MCH: 30.1 pg (ref 26.0–34.0)
MCHC: 34.3 g/dL (ref 30.0–36.0)
MCV: 87.7 fL (ref 80.0–100.0)
Platelets: 209 10*3/uL (ref 150–400)
RBC: 3.26 MIL/uL — ABNORMAL LOW (ref 3.87–5.11)
RDW: 14.3 % (ref 11.5–15.5)
WBC: 13.7 10*3/uL — ABNORMAL HIGH (ref 4.0–10.5)
nRBC: 0 % (ref 0.0–0.2)

## 2021-08-08 LAB — MAGNESIUM: Magnesium: 1.6 mg/dL — ABNORMAL LOW (ref 1.7–2.4)

## 2021-08-08 MED ORDER — MAGNESIUM OXIDE -MG SUPPLEMENT 400 (240 MG) MG PO TABS
400.0000 mg | ORAL_TABLET | Freq: Two times a day (BID) | ORAL | Status: DC
  Administered 2021-08-08 – 2021-08-10 (×5): 400 mg via ORAL
  Filled 2021-08-08 (×5): qty 1

## 2021-08-08 MED FILL — Sodium Bicarbonate IV Soln 8.4%: INTRAVENOUS | Qty: 50 | Status: AC

## 2021-08-08 MED FILL — Calcium Chloride Inj 10%: INTRAVENOUS | Qty: 10 | Status: AC

## 2021-08-08 NOTE — Progress Notes (Signed)
Pt ambulated with standby assist and front wheel walker around the entire OBSC unit x1 without difficulty. Pt tolerated well.  ?

## 2021-08-08 NOTE — Progress Notes (Signed)
Daily Obstetrics Note  ?Admission Date: 07/31/2021 ?Current Date: 08/08/2021 ?9:06 AM ? ?Debra Bernard is a 36 y.o. [id number] POD#7 s/p vac assissted vag delivery c/b PPH and DIC>D&C/ex-lap/supracervical hyst and LSO/cysto/IR embolizations admitted for induction of labor at 39wks for unstable lie ? ?Pregnancy complicated by: ?Patient Active Problem List  ? Diagnosis Date Noted  ? Postoperative fever 08/07/2021  ? DIC (disseminated intravascular coagulation) ([place]) 08/05/2021  ? PPH (postpartum hemorrhage) 08/05/2021  ? Shock liver 08/05/2021  ? AKI (acute kidney injury) ([place]) 08/05/2021  ? Thrombocytopenia due to blood loss 08/05/2021  ? Elevated LFTs   ? Acute blood loss anemia   ? Third-stage postpartum hemorrhage, postpartum 08/01/2021  ? AMA (advanced maternal age) multigravida 63+, third trimester 12/30/2020  ? Supervision of high risk pregnancy, antepartum 12/30/2020  ? GBS (group B Streptococcus carrier), +RV culture, currently pregnant 08/19/2013  ? ? ?Overnight/24hr events:  ?New dressing applied overnight ?Patient just had temp to 100.8 ?Subjective:  ?No cough, sob, chest pain.  ? ?Objective:  ? ? Current Vital Signs 24h Vital Sign Ranges  ?T 98.3 ?F (36.8 ?C) Temp  Avg: 99.3 ?F (37.4 ?C)  Min: 98.3 ?F (36.8 ?C)  Max: 100.5 ?F (38.1 ?C)  ?BP 133/82 ? BP  Min: 119/69  Max: 143/82  ?HR (!) 110 ? Pulse  Avg: 95.1  Min: 88  Max: 110  ?RR 19 Resp  Avg: 17.9  Min: 16  Max: 19  ?SaO2 98 % Room Air SpO2  Avg: 97.1 %  Min: 95 %  Max: 100 %  ?    ? 24 Hour I/O Current Shift I/O  ?Time ?Ins ?Outs 04/30 0701 - 05/01 0700 ?In: 4200 [P.O.:4200] ?Out: 2625 [Urine:2625] No intake/output data recorded.  ? ?Patient Vitals for the past 24 hrs: ? BP Temp Temp src Pulse Resp SpO2 Weight  ?08/08/21 0854 133/82 98.3 ?F (36.8 ?C) Oral (!) 110 19 98 % --  ?08/08/21 0500 -- -- -- -- -- -- 88.5 kg  ?08/08/21 0316 (!) 141/88 98.6 ?F (37 ?C) Oral 88 16 97 % --  ?08/07/21 2354 131/83 -- -- 97 -- -- --  ?08/07/21 2317 (!) 142/72 99.5 ?F (37.5  ?C) Oral 92 18 97 % --  ?08/07/21 1925 (!) 143/82 100.1 ?F (37.8 ?C) Oral 92 18 95 % --  ?08/07/21 1710 (!) 143/80 (!) 100.5 ?F (38.1 ?C) Oral 92 19 97 % --  ?08/07/21 1130 119/69 98.7 ?F (37.1 ?C) Oral 94 17 96 % --  ?08/07/21 0945 122/82 99.7 ?F (37.6 ?C) Oral 96 18 100 % --  ? ?Physical exam: ?General: intubated. NAD ?GU:  foley just removed ?Respiratory: no distress. CTAB.  ?CV: normal s1 and s2, no mrgs ?Abdomen: +edema, pressure dressing c/d/I, mild to moderately ttp ?Ext: pt states she wore SCDs all night but just took them off ? ?Medications: ?Current Facility-Administered Medications  ?Medication Dose Route Frequency Provider Last Rate Last Admin  ? 0.9 %  sodium chloride infusion  250 mL Intravenous Continuous Ogan, Okoronkwo U, MD      ? benzocaine-Menthol (DERMOPLAST) 20-0.5 % topical spray 1 application.  1 application. Topical PRN Wende Mott, CNM      ? calcium acetate (PHOSLO) capsule 667 mg  667 mg Oral TID WC Lequita Halt, MD   667 mg at 08/08/21 0759  ? Chlorhexidine Gluconate Cloth 2 % PADS 6 each  6 each Topical Daily Jacky Kindle, MD   6 each at 08/06/21 1005  ? coconut  oil  1 application. Topical PRN Wende Mott, CNM      ? cyclobenzaprine (FLEXERIL) tablet 5 mg  5 mg Oral TID PRN Candee Furbish, MD   5 mg at 08/06/21 2307  ? witch hazel-glycerin (TUCKS) pad 1 application.  1 application. Topical PRN Wende Mott, CNM      ? And  ? dibucaine (NUPERCAINAL) 1 % rectal ointment 1 application.  1 application. Rectal PRN Wende Mott, CNM      ? diphenhydrAMINE (BENADRYL) 12.5 MG/5ML elixir 25 mg  25 mg Oral Q6H PRN Candee Furbish, MD      ? docusate sodium (COLACE) capsule 100 mg  100 mg Oral BID Candee Furbish, MD   100 mg at 08/07/21 1648  ? famotidine (PEPCID) tablet 20 mg  20 mg Oral Daily Gwynne Edinger, MD   20 mg at 08/07/21 1039  ? feeding supplement (ENSURE ENLIVE / ENSURE PLUS) liquid 237 mL  237 mL Oral TID BM Candee Furbish, MD   237 mL at 08/07/21 1952   ? fentaNYL (SUBLIMAZE) injection 25-75 mcg  25-75 mcg Intravenous Q2H PRN Candee Furbish, MD   50 mcg at 08/08/21 6578  ? HYDROmorphone (DILAUDID) tablet 2-4 mg  2-4 mg Oral Q6H PRN [name], MD   4 mg at 08/08/21 0759  ? hydrOXYzine (ATARAX) tablet 10 mg  10 mg Oral Q6H PRN Donnamae Jude, MD   10 mg at 08/06/21 1429  ? labetalol (NORMODYNE) injection 10 mg  10 mg Intravenous Q2H PRN Candee Furbish, MD   10 mg at 08/04/21 0145  ? lip balm (CARMEX) ointment   Topical PRN Candee Furbish, MD      ? magnesium oxide (MAG-OX) tablet 400 mg  400 mg Oral BID Florian Buff, MD      ? ondansetron [facility]) tablet 4 mg  4 mg Oral Q4H PRN Candee Furbish, MD      ? Or  ? ondansetron [facility]) injection 4 mg  4 mg Intravenous Q4H PRN Candee Furbish, MD   4 mg at 08/03/21 2030  ? polyethylene glycol (MIRALAX / GLYCOLAX) packet 17 g  17 g Oral Daily Candee Furbish, MD   17 g at 08/04/21 4696  ? prenatal multivitamin tablet 1 tablet  1 tablet Oral Q1200 Candee Furbish, MD   1 tablet at 08/07/21 1226  ? simethicone (MYLICON) chewable tablet 80 mg  80 mg Oral PRN Candee Furbish, MD      ? sodium chloride flush (NS) 0.9 % injection 10-40 mL  10-40 mL Intracatheter Q12H Candee Furbish, MD   10 mL at 08/07/21 1952  ? sodium chloride flush (NS) 0.9 % injection 10-40 mL  10-40 mL Intracatheter PRN Candee Furbish, MD      ? zolpidem Lorrin Mais) tablet 5 mg  5 mg Oral QHS PRN Candee Furbish, MD   5 mg at 08/06/21 1738  ? ? ?Labs:  ? ?  Latest Ref Rng & Units 08/08/2021  ?  4:15 AM 08/07/2021  ?  5:12 AM 08/06/2021  ?  5:44 AM  ?CBC  ?WBC 4.0 - 10.5 K/uL 13.7   12.1   15.7    ?Hemoglobin 12.0 - 15.0 g/dL 9.8   9.8   10.5    ?Hematocrit 36.0 - 46.0 % 28.6   29.5   30.8    ?Platelets 150 - 400 K/uL 209   162  130    ? 139    ? ? ?  Latest Ref Rng & Units 08/08/2021  ?  4:15 AM 08/07/2021  ?  5:12 AM 08/06/2021  ?  5:44 AM  ?CMP  ?Glucose 70 - 99 mg/dL 90   84   116    ?BUN 6 - 20 mg/dL 21   32   42    ?Creatinine 0.44 - 1.00 mg/dL  1.54   1.67   1.87    ?Sodium 135 - 145 mmol/L 138   138   139    ?Potassium 3.5 - 5.1 mmol/L 3.7   3.3   3.7    ?Chloride 98 - 111 mmol/L 110   108   108    ?CO2 22 - 32 mmol/L '23   23   22    '$ ?Calcium 8.9 - 10.3 mg/dL 7.8   8.1   8.3    ?Total Protein 6.5 - 8.1 g/dL 4.7   4.5   4.9    ?Total Bilirubin 0.3 - 1.2 mg/dL 0.8   0.7   0.7    ?Alkaline Phos 38 - 126 U/L 62   63   76    ?AST 15 - 41 U/L 37   68   227    ?ALT 0 - 44 U/L 302   456   854    ? ?Micro Results ?Recent Results (from the past 240 hour(s))  ?Culture, blood (Routine X 2) w Reflex to ID Panel     Status: None  ? Collection Time: 08/01/21  2:12 PM  ? Specimen: BLOOD  ?Result Value Ref Range Status  ? Specimen Description BLOOD CENTRAL LINE  Final  ? Special Requests   Final  ?  BOTTLES DRAWN AEROBIC AND ANAEROBIC Blood Culture adequate volume  ? Culture   Final  ?  NO GROWTH 5 DAYS ?Performed at [facility], [place] [address], [place], [place] [address] ?  ? Report Status 08/06/2021 FINAL  Final  ?Culture, blood (Routine X 2) w Reflex to ID Panel     Status: None  ? Collection Time: 08/01/21  2:35 PM  ? Specimen: BLOOD  ?Result Value Ref Range Status  ? Specimen Description BLOOD CENTRAL LINE  Final  ? Special Requests   Final  ?  BOTTLES DRAWN AEROBIC AND ANAEROBIC Blood Culture adequate volume  ? Culture   Final  ?  NO GROWTH 5 DAYS ?Performed at [facility], [place] [address], [place], [place] [address] ?  ? Report Status 08/06/2021 FINAL  Final  ?MRSA Next Gen by PCR, Nasal     Status: None  ? Collection Time: 08/01/21  6:24 PM  ? Specimen: Nasal Mucosa; Nasal Swab  ?Result Value Ref Range Status  ? MRSA by PCR Next Gen NOT DETECTED NOT DETECTED Final  ?  Comment: (NOTE) ?The GeneXpert MRSA Assay (FDA approved for NASAL specimens only), ?is one component of a comprehensive MRSA colonization surveillance ?program. It is not intended to diagnose MRSA infection nor to guide ?or monitor treatment for MRSA infections. ?Test performance is not  FDA approved in patients less than 2 years ?old. ?Performed at [facility], [place] [address] [place], [place] ?[address] ?  ?Culture, blood (Routine X 2) w Reflex to ID Panel     Status: None (Prel

## 2021-08-08 NOTE — Progress Notes (Signed)
Physical Therapy Treatment ?Patient Details ?Name: Debra Bernard ?MRN: [id number] ?DOB: 1985/06/14 ?Today's Date: 08/08/2021 ? ? ?History of Present Illness Pt adm 4/23 for induction of labor. Pt with vaginal delivery on 5/88 complicated by post partum hemorrhage/DIC followed by emergent hysterectomy, lt salpingo-oophorectomy. Pt with emergent IR bilateral internal iliac gelfoam embolization and coiling of vessel off the rt internal iliac. Pt intubated 4/24-4/26. new fever, and incr abdominal pain 5/02; PMH - none applicable. ? ?  ?PT Comments  ? ? Continuing work on functional mobility and activity tolerance;  session focused on progressive amb, and pt wanted to try without RW, so we proceeded without; Slow gait, no overt loss of balance, but still a bit unsteady without UE support; we discussed using the RW to help with stability and to use UEs to stabilize, taking a load off of her abdominals, and helping with pain   ?Recommendations for follow up therapy are one component of a multi-disciplinary discharge planning process, led by the attending physician.  Recommendations may be updated based on patient status, additional functional criteria and insurance authorization. ? ?Follow Up Recommendations ? Home health PT ?  ?  ?Assistance Recommended at Discharge Intermittent Supervision/Assistance  ?Patient can return home with the following Help with stairs or ramp for entrance;Assistance with cooking/housework;Other (comment);A little help with walking and/or transfers;A little help with bathing/dressing/bathroom (Assist caring for baby) ?  ?Equipment Recommendations ? Rolling walker (2 wheels);Wheelchair (measurements PT);Wheelchair cushion (measurements PT) (may need wheelchair due to mom cant assist pt much)  ?  ?Recommendations for Other Services   ? ? ?  ?Precautions / Restrictions Precautions ?Precautions: Fall ?Precaution Comments: Cues to self-monitor for activity tolerance  ?  ? ?Mobility ? Bed Mobility ?  ?  ?   ?  ?  ?  ?  ?  ?  ? ?Transfers ?Overall transfer level: Needs assistance ?Equipment used: 1 person hand held assist ?Transfers: Sit to/from Stand ?Sit to Stand: Min guard ?  ?  ?  ?  ?  ?General transfer comment: [place] to steady; slow rise ?  ? ?Ambulation/Gait ?Ambulation/Gait assistance: Min guard (with and without physical contact) ?Gait Distance (Feet): 90 Feet ?Assistive device: None ?Gait Pattern/deviations: Step-through pattern, Decreased step length - right, Decreased step length - left ?Gait velocity: slowed ?  ?  ?General Gait Details: Very slow steps, and noted pt tends to stop walking when talking; Cues to self-monitor for activity tolerance ? ? ?Stairs ?  ?  ?  ?  ?  ? ? ?Wheelchair Mobility ?  ? ?Modified Rankin (Stroke Patients Only) ?  ? ? ?  ?Balance   ?  ?Sitting balance-Leahy Scale: Fair ?  ?  ?  ?Standing balance-Leahy Scale: Fair ?  ?  ?  ?  ?  ?  ?  ?  ?  ?  ?  ?  ?  ? ?  ?Cognition Arousal/Alertness: Awake/alert ?Behavior During Therapy: [facility] for tasks assessed/performed ?Overall Cognitive Status: Within Functional Limits for tasks assessed ?  ?  ?  ?  ?  ?  ?  ?  ?  ?  ?  ?  ?  ?  ?  ?  ?  ?  ?  ? ?  ?Exercises   ? ?  ?General Comments General comments (skin integrity, edema, etc.): We discussed usefullness of a RW and wheelchair for use at home ?  ?  ? ?Pertinent Vitals/Pain Pain Assessment ?Pain Assessment: 0-10 ?Pain Score: 7  ?  Pain Location: abdomen ?Pain Descriptors / Indicators: Guarding, Grimacing ?Pain Intervention(s): Premedicated before session  ? ? ?Home Living   ?  ?  ?  ?  ?  ?  ?  ?  ?  ?   ?  ?Prior Function    ?  ?  ?   ? ?PT Goals (current goals can now be found in the care plan section) Acute Rehab PT Goals ?Patient Stated Goal: Wants to walk ?PT Goal Formulation: With patient ?Time For Goal Achievement: 08/11/21 ?Potential to Achieve Goals: Good ?Progress towards PT goals: Progressing toward goals ? ?  ?Frequency ? ? ? Min 3X/week ? ? ? ?  ?PT Plan Current plan remains  appropriate  ? ? ?Co-evaluation   ?  ?  ?  ?  ? ?  ?AM-PAC PT "6 Clicks" Mobility   ?Outcome Measure ? Help needed turning from your back to your side while in a flat bed without using bedrails?: None ?Help needed moving from lying on your back to sitting on the side of a flat bed without using bedrails?: A Little ?Help needed moving to and from a bed to a chair (including a wheelchair)?: A Little ?Help needed standing up from a chair using your arms (e.g., wheelchair or bedside chair)?: A Little ?Help needed to walk in hospital room?: A Little ?Help needed climbing 3-5 steps with a railing? : A Lot ?6 Click Score: 18 ? ?  ?End of Session Equipment Utilized During Treatment: Gait belt (at axillae) ?Activity Tolerance: Patient tolerated treatment well ?Patient left: with family/visitor present (with family member assisting her to bedroom) ?Nurse Communication: Mobility status ?PT Visit Diagnosis: Other abnormalities of gait and mobility (R26.89);Muscle weakness (generalized) (M62.81) ?  ? ? ?Time: 3818-2993 ?PT Time Calculation (min) (ACUTE ONLY): 22 min ? ?Charges:  $Gait Training: 8-22 mins          ?          ? ?Roney Marion, PT  ?Acute Rehabilitation Services ?Office [phone] ? ? ? ?Colletta Maryland ?08/08/2021, 3:52 PM ? ?

## 2021-08-08 NOTE — Lactation Note (Signed)
This note was copied from a baby's chart. ?Lactation Consultation Note ?Mom has only been formula feeding. Mom has pump set up in room but hasn't used it. Evan asked mom if she has any questions about BF or engorgement mom stated no. ?Mom stated "I may pump tomorrow." LC stated OK if she needs assistance to call. ?Mom asked LC if I was going to be here tonight LC stated yes. Mom stated she may pump tonight. ? ?Patient Name: Debra Bernard ?Today's Date: 08/08/2021 ?Reason for consult: Follow-up assessment ?Age:36 days ? ?Maternal Data ?  ? ?Feeding ?Mother's Current Feeding Choice: Formula ?Nipple Type: Slow - flow ? ?LATCH Score ?  ? ?  ? ?  ? ?  ? ?  ? ?  ? ? ?Lactation Tools Discussed/Used ?  ? ?Interventions ?  ? ?Discharge ?  ? ?Consult Status ?Consult Status: Complete ?Date: 08/08/21 ? ? ? ?Theodoro Kalata ?08/08/2021, 9:15 PM ? ? ? ?

## 2021-08-09 ENCOUNTER — Encounter: Payer: Medicaid Other | Admitting: Family Medicine

## 2021-08-09 LAB — COMPREHENSIVE METABOLIC PANEL
ALT: 229 U/L — ABNORMAL HIGH (ref 0–44)
AST: 36 U/L (ref 15–41)
Albumin: 2.3 g/dL — ABNORMAL LOW (ref 3.5–5.0)
Alkaline Phosphatase: 66 U/L (ref 38–126)
Anion gap: 9 (ref 5–15)
BUN: 20 mg/dL (ref 6–20)
CO2: 19 mmol/L — ABNORMAL LOW (ref 22–32)
Calcium: 8.2 mg/dL — ABNORMAL LOW (ref 8.9–10.3)
Chloride: 109 mmol/L (ref 98–111)
Creatinine, Ser: 1.38 mg/dL — ABNORMAL HIGH (ref 0.44–1.00)
GFR, Estimated: 51 mL/min — ABNORMAL LOW (ref >60)
Glucose, Bld: 112 mg/dL — ABNORMAL HIGH (ref 70–99)
Potassium: 4.3 mmol/L (ref 3.5–5.1)
Sodium: 137 mmol/L (ref 135–145)
Total Bilirubin: 0.8 mg/dL (ref 0.3–1.2)
Total Protein: 5.2 g/dL — ABNORMAL LOW (ref 6.5–8.1)

## 2021-08-09 LAB — CBC
HCT: 31.6 % — ABNORMAL LOW (ref 36.0–46.0)
Hemoglobin: 10 g/dL — ABNORMAL LOW (ref 12.0–15.0)
MCH: 29.5 pg (ref 26.0–34.0)
MCHC: 31.6 g/dL (ref 30.0–36.0)
MCV: 93.2 fL (ref 80.0–100.0)
Platelets: 279 10*3/uL (ref 150–400)
RBC: 3.39 MIL/uL — ABNORMAL LOW (ref 3.87–5.11)
RDW: 14.6 % (ref 11.5–15.5)
WBC: 15.7 10*3/uL — ABNORMAL HIGH (ref 4.0–10.5)
nRBC: 0 % (ref 0.0–0.2)

## 2021-08-09 LAB — MAGNESIUM: Magnesium: 1.8 mg/dL (ref 1.7–2.4)

## 2021-08-09 NOTE — Progress Notes (Signed)
Daily Postoperative Obstetrics Note  ?Admission Date: 07/31/2021 ?Current Date: 08/09/2021 ?9:52 AM ? ?Debra Bernard is a 36 y.o. [id number] POD#8 s/p vac assissted vag delivery c/b PPH and DIC>D&C/ex-lap/supracervical hyst and LSO/cysto/IR embolizations admitted for induction of labor at 39wks for unstable lie ? ?Pregnancy complicated by: ?Patient Active Problem List  ? Diagnosis Date Noted  ? Postoperative fever 08/07/2021  ? DIC (disseminated intravascular coagulation) ([place]) 08/05/2021  ? PPH (postpartum hemorrhage) 08/05/2021  ? Shock liver 08/05/2021  ? AKI (acute kidney injury) ([place]) 08/05/2021  ? Thrombocytopenia due to blood loss 08/05/2021  ? Elevated LFTs   ? Acute blood loss anemia   ? Third-stage postpartum hemorrhage, postpartum 08/01/2021  ? AMA (advanced maternal age) multigravida 20+, third trimester 12/30/2020  ? Supervision of high risk pregnancy, antepartum 12/30/2020  ? GBS (group B Streptococcus carrier), +RV culture, currently pregnant 08/19/2013  ? ? ?Overnight/24hr events:  ?Afebrile overnight  ?Subjective:  ?No concerning symptoms.  Pain is well controlled. Ambulating with aid of walker occasionally.   ? ?Objective:  ? ? Current Vital Signs 24h Vital Sign Ranges  ?T 98.8 ?F (37.1 ?C) Temp  Avg: 99 ?F (37.2 ?C)  Min: 97.9 ?F (36.6 ?C)  Max: 99.9 ?F (37.7 ?C)  ?BP 125/73 ? BP  Min: 125/73  Max: 135/87  ?HR 85 ? Pulse  Avg: 94.8  Min: 85  Max: 101  ?RR 16 Resp  Avg: 16.5  Min: 15  Max: 18  ?SaO2 100 % Room Air SpO2  Avg: 99.2 %  Min: 97 %  Max: 100 %  ?    ? 24 Hour I/O Current Shift I/O  ?Time ?Ins ?Outs 05/01 0701 - 05/02 0700 ?In: 1560 [P.O.:1560] ?Out: 3900 [Urine:3900] 05/02 0701 - 05/02 1900 ?In: 500 [P.O.:500] ?Out: 650 [Urine:650]  ? ?Patient Vitals for the past 24 hrs: ? BP Temp Temp src Pulse Resp SpO2 Weight  ?08/09/21 0804 125/73 98.8 ?F (37.1 ?C) Oral 85 16 100 % --  ?08/09/21 0450 -- -- -- -- -- -- 86.2 kg  ?08/09/21 0440 134/77 99.8 ?F (37.7 ?C) Oral (!) 101 16 100 % --  ?08/09/21 0138  -- -- -- -- 16 -- --  ?08/08/21 2008 132/86 98.5 ?F (36.9 ?C) Axillary 99 15 99 % --  ?08/08/21 1532 135/87 99.9 ?F (37.7 ?C) Oral 93 18 100 % --  ?08/08/21 1155 135/89 97.9 ?F (36.6 ?C) Oral 96 18 97 % --  ? ? ?Physical exam: ?General: intubated. NAD ?GU:  foley just removed ?Respiratory: no distress. CTAB.  ?CV: normal s1 and s2, no mrgs ?Abdomen: +edema, pressure dressing removed, incision c/d/I with staples, no erythema, no drainage ?Ext: No c/c/e ? ?Medications: ?Current Facility-Administered Medications  ?Medication Dose Route Frequency Provider Last Rate Last Admin  ? 0.9 %  sodium chloride infusion  250 mL Intravenous Continuous Ogan, Okoronkwo U, MD      ? benzocaine-Menthol (DERMOPLAST) 20-0.5 % topical spray 1 application.  1 application. Topical PRN Wende Mott, CNM      ? Chlorhexidine Gluconate Cloth 2 % PADS 6 each  6 each Topical Daily Jacky Kindle, MD   6 each at 08/06/21 1005  ? coconut oil  1 application. Topical PRN Wende Mott, CNM      ? cyclobenzaprine (FLEXERIL) tablet 5 mg  5 mg Oral TID PRN Candee Furbish, MD   5 mg at 08/06/21 2307  ? witch hazel-glycerin (TUCKS) pad 1 application.  1 application. Topical PRN Len Blalock  M, CNM      ? And  ? dibucaine (NUPERCAINAL) 1 % rectal ointment 1 application.  1 application. Rectal PRN Wende Mott, CNM   1 application. at 08/08/21 2211  ? diphenhydrAMINE (BENADRYL) 12.5 MG/5ML elixir 25 mg  25 mg Oral Q6H PRN Candee Furbish, MD      ? docusate sodium (COLACE) capsule 100 mg  100 mg Oral BID Candee Furbish, MD   100 mg at 08/09/21 2956  ? famotidine (PEPCID) tablet 20 mg  20 mg Oral Daily Gwynne Edinger, MD   20 mg at 08/09/21 2130  ? feeding supplement (ENSURE ENLIVE / ENSURE PLUS) liquid 237 mL  237 mL Oral TID BM Candee Furbish, MD   237 mL at 08/09/21 8657  ? fentaNYL (SUBLIMAZE) injection 25-75 mcg  25-75 mcg Intravenous Q2H PRN Candee Furbish, MD   50 mcg at 08/08/21 1603  ? HYDROmorphone (DILAUDID) tablet 2-4 mg   2-4 mg Oral Q6H PRN [name], MD   4 mg at 08/09/21 0451  ? hydrOXYzine (ATARAX) tablet 10 mg  10 mg Oral Q6H PRN Donnamae Jude, MD   10 mg at 08/06/21 1429  ? labetalol (NORMODYNE) injection 10 mg  10 mg Intravenous Q2H PRN Candee Furbish, MD   10 mg at 08/04/21 0145  ? lip balm (CARMEX) ointment   Topical PRN Candee Furbish, MD      ? magnesium oxide (MAG-OX) tablet 400 mg  400 mg Oral BID Florian Buff, MD   400 mg at 08/09/21 8469  ? ondansetron (ZOFRAN) tablet 4 mg  4 mg Oral Q4H PRN Candee Furbish, MD      ? Or  ? ondansetron [facility]) injection 4 mg  4 mg Intravenous Q4H PRN Candee Furbish, MD   4 mg at 08/03/21 2030  ? polyethylene glycol (MIRALAX / GLYCOLAX) packet 17 g  17 g Oral Daily Candee Furbish, MD   17 g at 08/09/21 6295  ? prenatal multivitamin tablet 1 tablet  1 tablet Oral Q1200 Candee Furbish, MD   1 tablet at 08/08/21 1100  ? simethicone (MYLICON) chewable tablet 80 mg  80 mg Oral PRN Candee Furbish, MD      ? sodium chloride flush (NS) 0.9 % injection 10-40 mL  10-40 mL Intracatheter Q12H Candee Furbish, MD   10 mL at 08/09/21 2841  ? sodium chloride flush (NS) 0.9 % injection 10-40 mL  10-40 mL Intracatheter PRN Candee Furbish, MD      ? zolpidem Lorrin Mais) tablet 5 mg  5 mg Oral QHS PRN Candee Furbish, MD   5 mg at 08/06/21 1738  ? ? ?Labs:  ? ?  Latest Ref Rng & Units 08/09/2021  ?  4:50 AM 08/08/2021  ?  4:15 AM 08/07/2021  ?  5:12 AM  ?CBC  ?WBC 4.0 - 10.5 K/uL 15.7   13.7   12.1    ?Hemoglobin 12.0 - 15.0 g/dL 10.0   9.8   9.8    ?Hematocrit 36.0 - 46.0 % 31.6   28.6   29.5    ?Platelets 150 - 400 K/uL 279   209   162    ? ? ?  Latest Ref Rng & Units 08/09/2021  ?  4:50 AM 08/08/2021  ?  4:15 AM 08/07/2021  ?  5:12 AM  ?CMP  ?Glucose 70 - 99 mg/dL 112   90  84    ?BUN 6 - 20 mg/dL 20   21   32    ?Creatinine 0.44 - 1.00 mg/dL 1.38   1.54   1.67    ?Sodium 135 - 145 mmol/L 137   138   138    ?Potassium 3.5 - 5.1 mmol/L 4.3   3.7   3.3    ?Chloride 98 - 111 mmol/L 109   110   108     ?CO2 22 - 32 mmol/L '19   23   23    '$ ?Calcium 8.9 - 10.3 mg/dL 8.2   7.8   8.1    ?Total Protein 6.5 - 8.1 g/dL 5.2   4.7   4.5    ?Total Bilirubin 0.3 - 1.2 mg/dL 0.8   0.8   0.7    ?Alkaline Phos 38 - 126 U/L 66   62   63    ?AST 15 - 41 U/L 36   37   68    ?ALT 0 - 44 U/L 229   302   456    ? ?Micro Results ?Recent Results (from the past 240 hour(s))  ?Culture, blood (Routine X 2) w Reflex to ID Panel     Status: None  ? Collection Time: 08/01/21  2:12 PM  ? Specimen: BLOOD  ?Result Value Ref Range Status  ? Specimen Description BLOOD CENTRAL LINE  Final  ? Special Requests   Final  ?  BOTTLES DRAWN AEROBIC AND ANAEROBIC Blood Culture adequate volume  ? Culture   Final  ?  NO GROWTH 5 DAYS ?Performed at [facility], [place] [address] [place], [place] [address] ?  ? Report Status 08/06/2021 FINAL  Final  ?Culture, blood (Routine X 2) w Reflex to ID Panel     Status: None  ? Collection Time: 08/01/21  2:35 PM  ? Specimen: BLOOD  ?Result Value Ref Range Status  ? Specimen Description BLOOD CENTRAL LINE  Final  ? Special Requests   Final  ?  BOTTLES DRAWN AEROBIC AND ANAEROBIC Blood Culture adequate volume  ? Culture   Final  ?  NO GROWTH 5 DAYS ?Performed at [facility], [place] [address] [place], [place] [address] ?  ? Report Status 08/06/2021 FINAL  Final  ?MRSA Next Gen by PCR, Nasal     Status: None  ? Collection Time: 08/01/21  6:24 PM  ? Specimen: Nasal Mucosa; Nasal Swab  ?Result Value Ref Range Status  ? MRSA by PCR Next Gen NOT DETECTED NOT DETECTED Final  ?  Comment: (NOTE) ?The GeneXpert MRSA Assay (FDA approved for NASAL specimens only), ?is one component of a comprehensive MRSA colonization surveillance ?program. It is not intended to diagnose MRSA infection nor to guide ?or monitor treatment for MRSA infections. ?Test performance is not FDA approved in patients less than 2 years ?old. ?Performed at [facility], [place] [address] [place], [place] ?[address] ?  ?Culture, blood (Routine X  2) w Reflex to ID Panel     Status: None (Preliminary result)  ? Collection Time: 08/07/21 10:10 AM  ? Specimen: BLOOD  ?Result Value Ref Range Status  ? Specimen Description BLOOD SITE NOT SPECIFIE

## 2021-08-09 NOTE — Lactation Note (Addendum)
This note was copied from a baby's chart. ?Lactation Consultation Note ? ?Patient Name: Debra Bernard ?Today's Date: 08/09/2021 ?Reason for consult: Follow-up assessment;Mother's request;Term;Breastfeeding assistance ?Age:36 days ? ?Mom mostly bottle feeding formula. Mom states milk has not come in yet. Mom encouraged to start pumping with DEBP. LC reviewed pump parts and pumping frequency. Mom interested in getting a pump for home. [facility] referral sent out.  ? ? ?Infant feeding 60 ml or more per feeding with adequate urine and stool output. Mom recent shower and did not want to start using the pump at the time of the [facility] visit, she stated she will start pumping later in the day. Mom provided with [facility] number to follow up on support for home.  ?All questions answered at the end of the visit.  ? ?Pratt returned to assist getting mother pumping with 30 flanges, milk flowing and she states better fit than 27. Mom left pumping at the end of the [facility] visit.  ? ?Maternal Data ?  ? ?Feeding ?Mother's Current Feeding Choice: Breast Milk and Formula ?Nipple Type: Slow - flow ? ?LATCH Score ?  ? ?  ? ?  ? ?  ? ?  ? ?  ? ? ?Lactation Tools Discussed/Used ?Tools: Pump;Flanges ?Flange Size: 30 ?Breast pump type: Double-Electric Breast Pump ?Pump Education: Setup, frequency, and cleaning;Milk Storage ?Reason for Pumping: increase stimulation ?Pumping frequency: every 3 hrs for 15 min ? ?Interventions ?Interventions: Breast feeding basics reviewed;Breast compression;Expressed milk;Education;Infant Driven Feeding Algorithm education ? ?Discharge ?[place] Program: Yes ? ?Consult Status ?Consult Status: Follow-up ?Date: 08/10/21 ?Follow-up type: In-patient ? ? ? ?Aliyha Fornes  Nicholson-Springer ?08/09/2021, 11:42 AM ? ? ? ?

## 2021-08-10 ENCOUNTER — Encounter: Payer: Self-pay | Admitting: Obstetrics & Gynecology

## 2021-08-10 ENCOUNTER — Other Ambulatory Visit (HOSPITAL_COMMUNITY): Payer: Self-pay

## 2021-08-10 DIAGNOSIS — Z90711 Acquired absence of uterus with remaining cervical stump: Secondary | ICD-10-CM | POA: Diagnosis not present

## 2021-08-10 MED ORDER — DOCUSATE SODIUM 100 MG PO CAPS
100.0000 mg | ORAL_CAPSULE | Freq: Two times a day (BID) | ORAL | 2 refills | Status: AC | PRN
  Filled 2021-08-10: qty 30, 15d supply, fill #0

## 2021-08-10 MED ORDER — IBUPROFEN 600 MG PO TABS
600.0000 mg | ORAL_TABLET | Freq: Four times a day (QID) | ORAL | 2 refills | Status: DC | PRN
  Filled 2021-08-10: qty 30, 8d supply, fill #0

## 2021-08-10 MED ORDER — HYDROCORTISONE (PERIANAL) 2.5 % EX CREA
TOPICAL_CREAM | Freq: Two times a day (BID) | CUTANEOUS | 2 refills | Status: AC
  Filled 2021-08-10: qty 30, 14d supply, fill #0

## 2021-08-10 MED ORDER — HYDROXYZINE HCL 10 MG PO TABS
10.0000 mg | ORAL_TABLET | Freq: Four times a day (QID) | ORAL | 0 refills | Status: AC | PRN
  Filled 2021-08-10: qty 30, 8d supply, fill #0

## 2021-08-10 MED ORDER — CYCLOBENZAPRINE HCL 5 MG PO TABS
5.0000 mg | ORAL_TABLET | Freq: Three times a day (TID) | ORAL | 2 refills | Status: AC | PRN
  Filled 2021-08-10: qty 30, 10d supply, fill #0

## 2021-08-10 MED ORDER — SIMETHICONE 80 MG PO CHEW
80.0000 mg | CHEWABLE_TABLET | ORAL | 0 refills | Status: AC | PRN
  Filled 2021-08-10: qty 30, 30d supply, fill #0

## 2021-08-10 MED ORDER — HYDROMORPHONE HCL 2 MG PO TABS
2.0000 mg | ORAL_TABLET | ORAL | 0 refills | Status: DC | PRN
  Filled 2021-08-10: qty 30, 5d supply, fill #0

## 2021-08-10 NOTE — Progress Notes (Signed)
Pt home without motrin and it was returned to pharmacy ?

## 2021-08-10 NOTE — Care Management (Addendum)
11:47 Ordered RW through [employer] it will be delivered to the room prior to discharge.  ?[id number] Requested by staff to order a rollator for patient. Order placed and [employer] requested to exchange RW for Rollator. Due to payor source, home health services as recommended by PT could not be established.  ?

## 2021-08-10 NOTE — Progress Notes (Signed)
Physical Therapy Treatment ?Patient Details ?Name: Debra Bernard ?MRN: [id number] ?DOB: [birth year] ?Today's Date: 08/10/2021 ? ? ?History of Present Illness Pt adm 4/23 for induction of labor. Pt with vaginal delivery on 0/16 complicated by post partum hemorrhage/DIC followed by emergent hysterectomy, lt salpingo-oophorectomy. Pt with emergent IR bilateral internal iliac gelfoam embolization and coiling of vessel off the rt internal iliac. Pt intubated 4/24-4/26. new fever, and incr abdominal pain 0/10; PMH - none applicable. ? ?  ?PT Comments  ? ? Continuing work on functional mobility and activity tolerance;  Session focused on discernment of optimal RW for dc home; Pt making excellent progress, and walking more without RW; Practiced with Rollator RW, and ultimately pt considers it the best to give her more options for support with amb, and a seat when needed; Updated Gerald Stabs, RN and Jackelyn Poling, [facility] RN, and appreciate the help  ?Recommendations for follow up therapy are one component of a multi-disciplinary discharge planning process, led by the attending physician.  Recommendations may be updated based on patient status, additional functional criteria and insurance authorization. ? ?Follow Up Recommendations ? Other (comment) (HHPT not available with pt's insurance) ?  ?  ?Assistance Recommended at Discharge Intermittent Supervision/Assistance  ?Patient can return home with the following Help with stairs or ramp for entrance;Assistance with cooking/housework;Other (comment);A little help with walking and/or transfers;A little help with bathing/dressing/bathroom (Assist caring for baby) ?  ?Equipment Recommendations ? Rollator (4 wheels)  ?  ?Recommendations for Other Services   ? ? ?  ?Precautions / Restrictions Precautions ?Precautions: Fall ?Precaution Comments: Cues to self-monitor for activity tolerance; fall risk present, but minimal  ?  ? ?Mobility ? Bed Mobility ?  ?  ?  ?  ?  ?  ?  ?  ?  ? ?Transfers ?Overall  transfer level: Needs assistance ?Equipment used: Rollator (4 wheels) ?Transfers: Sit to/from Stand ?Sit to Stand: Min guard ?  ?  ?  ?  ?  ?General transfer comment: Sat down to locked rollator with cues for technique, hand placement; Needed to be careful to keep her garment from getting under wheel during transition; stood from rollator well with incr use of UEs to steady ?  ? ?Ambulation/Gait ?Ambulation/Gait assistance: Min guard (with and without physical contact) ?Gait Distance (Feet): 15 Feet (in room) ?Assistive device: Rollator (4 wheels) ?  ?  ?  ?  ?General Gait Details: Used rollator in room for practice; managing well ? ? ?Stairs ?  ?  ?  ?  ?  ? ? ?Wheelchair Mobility ?  ? ?Modified Rankin (Stroke Patients Only) ?  ? ? ?  ?Balance   ?  ?Sitting balance-Leahy Scale: Fair ?  ?  ?  ?Standing balance-Leahy Scale: Fair ?  ?  ?  ?  ?  ?  ?  ?  ?  ?  ?  ?  ?  ? ?  ?Cognition Arousal/Alertness: Awake/alert ?Behavior During Therapy: [facility] for tasks assessed/performed ?Overall Cognitive Status: Within Functional Limits for tasks assessed ?  ?  ?  ?  ?  ?  ?  ?  ?  ?  ?  ?  ?  ?  ?  ?  ?  ?  ?  ? ?  ?Exercises   ? ?  ?General Comments General comments (skin integrity, edema, etc.): Discussed usefullness of rollator at home; this piece of equipment will be helpful, and decr the need for a wheelchair; notified TOC, and appreciate updating ?  ?  ? ?  Pertinent Vitals/Pain Pain Assessment ?Pain Assessment: Faces ?Faces Pain Scale: Hurts a little bit ?Pain Location: abdomen ?Pain Descriptors / Indicators: Guarding ?Pain Intervention(s): Monitored during session  ? ? ?Home Living   ?  ?  ?  ?  ?  ?  ?  ?  ?  ?   ?  ?Prior Function    ?  ?  ?   ? ?PT Goals (current goals can now be found in the care plan section) Acute Rehab PT Goals ?Patient Stated Goal: Home today; prefers teh rollator ?PT Goal Formulation: With patient ?Time For Goal Achievement: 08/11/21 ?Potential to Achieve Goals: Good ?Progress towards PT goals:  Progressing toward goals ? ?  ?Frequency ? ? ? Min 3X/week ? ? ? ?  ?PT Plan Current plan remains appropriate;Equipment recommendations need to be updated  ? ? ?Co-evaluation   ?  ?  ?  ?  ? ?  ?AM-PAC PT "6 Clicks" Mobility   ?Outcome Measure ? Help needed turning from your back to your side while in a flat bed without using bedrails?: None ?Help needed moving from lying on your back to sitting on the side of a flat bed without using bedrails?: A Little ?Help needed moving to and from a bed to a chair (including a wheelchair)?: A Little ?Help needed standing up from a chair using your arms (e.g., wheelchair or bedside chair)?: None ?Help needed to walk in hospital room?: None ?Help needed climbing 3-5 steps with a railing? : A Little ?6 Click Score: 21 ? ?  ?End of Session   ?Activity Tolerance: Patient tolerated treatment well ?Patient left: in bed;with call bell/phone within reach ?Nurse Communication: Mobility status (and recommendation for Rollator) ?PT Visit Diagnosis: Other abnormalities of gait and mobility (R26.89);Muscle weakness (generalized) (M62.81) ?  ? ? ?Time: 1251-1300 ?PT Time Calculation (min) (ACUTE ONLY): 9 min ? ?Charges:  $Gait Training: 8-22 mins          ?          ? ?Roney Marion, PT  ?Acute Rehabilitation Services ?Office [phone] ? ? ? ?Colletta [place] ?08/10/2021, 2:09 PM ? ?

## 2021-08-12 LAB — CULTURE, BLOOD (ROUTINE X 2)
Culture: NO GROWTH
Culture: NO GROWTH
Special Requests: ADEQUATE
Special Requests: ADEQUATE

## 2021-08-17 ENCOUNTER — Ambulatory Visit (INDEPENDENT_AMBULATORY_CARE_PROVIDER_SITE_OTHER): Payer: Medicaid Other

## 2021-08-17 ENCOUNTER — Inpatient Hospital Stay (HOSPITAL_COMMUNITY)
Admission: AD | Admit: 2021-08-17 | Discharge: 2021-08-17 | Disposition: A | Discharge status: Home / Self Care | Payer: Medicaid Other | Attending: Obstetrics and Gynecology | Admitting: Obstetrics and Gynecology

## 2021-08-17 ENCOUNTER — Encounter (HOSPITAL_COMMUNITY): Payer: Self-pay | Admitting: Obstetrics and Gynecology

## 2021-08-17 VITALS — BP 155/88 | HR 89 | Wt 173.6 lb

## 2021-08-17 DIAGNOSIS — O909 Complication of the puerperium, unspecified: Secondary | ICD-10-CM | POA: Insufficient documentation

## 2021-08-17 DIAGNOSIS — Z5189 Encounter for other specified aftercare: Secondary | ICD-10-CM

## 2021-08-17 DIAGNOSIS — O165 Unspecified maternal hypertension, complicating the puerperium: Secondary | ICD-10-CM | POA: Insufficient documentation

## 2021-08-17 DIAGNOSIS — Z0130 Encounter for examination of blood pressure without abnormal findings: Secondary | ICD-10-CM

## 2021-08-17 DIAGNOSIS — R519 Headache, unspecified: Secondary | ICD-10-CM | POA: Diagnosis not present

## 2021-08-17 LAB — COMPREHENSIVE METABOLIC PANEL
ALT: 39 U/L (ref 0–44)
AST: 22 U/L (ref 15–41)
Albumin: 2.8 g/dL — ABNORMAL LOW (ref 3.5–5.0)
Alkaline Phosphatase: 77 U/L (ref 38–126)
Anion gap: 5 (ref 5–15)
BUN: 14 mg/dL (ref 6–20)
CO2: 23 mmol/L (ref 22–32)
Calcium: 9 mg/dL (ref 8.9–10.3)
Chloride: 110 mmol/L (ref 98–111)
Creatinine, Ser: 0.9 mg/dL (ref 0.44–1.00)
GFR, Estimated: >60 mL/min (ref >60)
Glucose, Bld: 94 mg/dL (ref 70–99)
Potassium: 4.4 mmol/L (ref 3.5–5.1)
Sodium: 138 mmol/L (ref 135–145)
Total Bilirubin: 0.5 mg/dL (ref 0.3–1.2)
Total Protein: 6.7 g/dL (ref 6.5–8.1)

## 2021-08-17 LAB — CBC
HCT: 30.6 % — ABNORMAL LOW (ref 36.0–46.0)
Hemoglobin: 10.2 g/dL — ABNORMAL LOW (ref 12.0–15.0)
MCH: 29.8 pg (ref 26.0–34.0)
MCHC: 33.3 g/dL (ref 30.0–36.0)
MCV: 89.5 fL (ref 80.0–100.0)
Platelets: 544 10*3/uL — ABNORMAL HIGH (ref 150–400)
RBC: 3.42 MIL/uL — ABNORMAL LOW (ref 3.87–5.11)
RDW: 14.3 % (ref 11.5–15.5)
WBC: 8.3 10*3/uL (ref 4.0–10.5)
nRBC: 0 % (ref 0.0–0.2)

## 2021-08-17 MED ORDER — NIFEDIPINE ER OSMOTIC RELEASE 30 MG PO TB24: 30.0000 mg | ORAL_TABLET | Freq: Every day | ORAL | 3 refills | Status: AC

## 2021-08-17 MED ORDER — IBUPROFEN 800 MG PO TABS
400.0000 mg | ORAL_TABLET | Freq: Once | ORAL | Status: AC
  Administered 2021-08-17: 400 mg via ORAL
  Filled 2021-08-17: qty 1

## 2021-08-17 MED ORDER — ACETAMINOPHEN 500 MG PO TABS
1000.0000 mg | ORAL_TABLET | Freq: Once | ORAL | Status: AC
  Administered 2021-08-17: 1000 mg via ORAL
  Filled 2021-08-17: qty 2

## 2021-08-17 MED ORDER — IBUPROFEN 400 MG PO TABS: 400.0000 mg | ORAL_TABLET | Freq: Four times a day (QID) | ORAL | 0 refills | Status: DC | PRN

## 2021-08-17 MED ORDER — HYDROMORPHONE HCL 2 MG PO TABS: 2.0000 mg | ORAL_TABLET | ORAL | 0 refills | Status: AC | PRN

## 2021-08-17 NOTE — Progress Notes (Signed)
Patient here today for follow up nurse visit after having a vaginal delivery on 7/48/27 complicated by documented postpartum hemorrhage, disseminated intravascular coagulation, acute kidney injury and status post abdominal supracervical subtotal hysterectomy. Today patient states she does "not feel quite right." Patient's blood pressure is 155/88. Patient states she has been having a headache and dizziness since yesterday with elevated blood pressure readings at home. Patient's incision site observed and found to have the edges separated at the bottom with pus-like drainage noted. Patient states she feels as though she has been feverish but has not checked her temperature at home. I reviewed all of this with Dr. Dione Plover. Dr. Dione Plover came to see patient. Dr. Dione Plover recommended that based on the patient's blood pressure and the symptoms she is having that she go to the Maternity Assessment Unit at the [facility]. He reviewed this with patient and patient verbalized understanding. I provided patient with the address to MAU. I also reviewed patient's postpartum appointment date and time with her. Patient verbalized understanding.  ? ?Paulina Fusi, RN ?08/17/21 ?

## 2021-08-17 NOTE — MAU Note (Signed)
Debra Bernard is a 36 y.o. at Unknown here in MAU reporting: vag del 4/24.  PP hemorrhage, resulting in hysterectomy. Sent from office today, BP elevated. (Is not on BP medication). Has HA, comes and goes- has not taken anything. denies visual changes,epigastric pain or increase in swelling.  ?Onset of complaint: new onset ?Pain score: 2 ?Vitals:  ? 08/17/21 1442  ?BP: (!) 135/94  ?Pulse: 99  ?Resp: 17  ?Temp: 99 ?F (37.2 ?C)  ?SpO2: 100%  ?   ? ?Lab orders placed from triage:  none ?

## 2021-08-17 NOTE — MAU Provider Note (Signed)
?History  ?  ? ?CSN: [id number] ? ?Arrival date and time: 08/17/21 1423 ? ? None  ?  ? ?Chief Complaint  ?Patient presents with  ? Hypertension  ? Headache  ? ?HPI ?Debra Bernard is a 36 y.o. [id number] s/p SVD on 4/24. Her postpartum course was complicated by immediate postpartum hemorrhage and DIC which ultimately resulted in patient receiving emergent hysterectomy. She was sent from the office today for elevated BP and headaches. Patient report intermittent headaches, currently had a headache that she rates 5/10. She has not take anything for her headache. She denies vision changes, RUQ/epigastric pain, swelling, or SOB. She is also reporting that her incision has opened up and has had a small amount of drainage and odor. She denies pain or tenderness to the site, nor does she report any bleeding, fever, or chills.  ? ?OB History   ? ? Gravida  ?4  ? Para  ?3  ? Term  ?3  ? Preterm  ?0  ? AB  ?1  ? Living  ?3  ?  ? ? SAB  ?1  ? IAB  ?0  ? Ectopic  ?0  ? Multiple  ?0  ? Live Births  ?3  ?   ?  ?  ? ? ?Past Medical History:  ?Diagnosis Date  ? Medical history non-contributory   ? ? ?Past Surgical History:  ?Procedure Laterality Date  ? ABDOMINAL HYSTERECTOMY  08/01/2021  ? Procedure: HYSTERECTOMY ABDOMINAL;  Surgeon: [name], MD;  Location: [facility] LD ORS;  Service: Gynecology;;  ? CYSTOSCOPY  08/01/2021  ? Procedure: CYSTOSCOPY;  Surgeon: [name], MD;  Location: [facility] LD ORS;  Service: Gynecology;;  ? DILATION AND CURETTAGE OF UTERUS N/A 08/01/2021  ? Procedure: DILATATION AND CURETTAGE;  Surgeon: [name], MD;  Location: [facility] LD ORS;  Service: Gynecology;  Laterality: N/A;  ? IR ANGIOGRAM SELECTIVE EACH ADDITIONAL VESSEL  08/01/2021  ? IR ANGIOGRAM SELECTIVE EACH ADDITIONAL VESSEL  08/01/2021  ? IR EMBO ART  VEN HEMORR LYMPH EXTRAV  INC GUIDE ROADMAPPING  08/01/2021  ? IR US GUIDE VASC ACCESS RIGHT  08/01/2021  ? RADIOLOGY WITH ANESTHESIA N/A 08/01/2021  ? Procedure: IR WITH ANESTHESIA;  Surgeon:  Radiologist, Medication, MD;  Location: [place];  Service: Radiology;  Laterality: N/A;  ? ? ?Family History  ?Problem Relation Age of Onset  ? Hypertension Mother   ? Varicose Veins Father   ? Alcohol abuse Neg Hx   ? Arthritis Neg Hx   ? Asthma Neg Hx   ? Birth defects Neg Hx   ? Cancer Neg Hx   ? COPD Neg Hx   ? Depression Neg Hx   ? Diabetes Neg Hx   ? Drug abuse Neg Hx   ? Early death Neg Hx   ? Hearing loss Neg Hx   ? Hyperlipidemia Neg Hx   ? Heart disease Neg Hx   ? Kidney disease Neg Hx   ? Learning disabilities Neg Hx   ? Mental illness Neg Hx   ? Mental retardation Neg Hx   ? Miscarriages / Stillbirths Neg Hx   ? Stroke Neg Hx   ? Vision loss Neg Hx   ? ? ?Social History  ? ?Tobacco Use  ? Smoking status: Never  ? Smokeless tobacco: Never  ?Vaping Use  ? Vaping Use: Never used  ?Substance Use Topics  ? Alcohol use: No  ? Drug use: No  ? ? ?Allergies: No Known Allergies ? ?Medications Prior  to Admission  ?Medication Sig Dispense Refill Last Dose  ? cyclobenzaprine (FLEXERIL) 5 MG tablet Take 1 tablet (5 mg total) by mouth 3 (three) times daily as needed for muscle spasms. 30 tablet 2 08/16/2021  ? docusate sodium (COLACE) 100 MG capsule Take 1 capsule (100 mg total) by mouth 2 (two) times daily as needed for mild constipation. 30 capsule 2 08/17/2021  ? hydrocortisone (ANUSOL-HC) 2.5 % rectal cream Place rectally 2 (two) times daily. 30 g 2 08/17/2021  ? polyethylene glycol powder (GLYCOLAX/MIRALAX) 17 GM/SCOOP powder Take 17 g by mouth daily as needed. 510 g 1 08/16/2021  ? Prenatal Vit-Fe Fumarate-FA (PREPLUS) 27-1 MG TABS Take 1 tablet by mouth daily. 30 tablet 13 Past Week  ? simethicone (MYLICON) 80 MG chewable tablet Chew 1 tablet (80 mg total) by mouth as needed for flatulence. 30 tablet 0 08/16/2021  ? [DISCONTINUED] HYDROmorphone (DILAUDID) 2 MG tablet Take 1 tablet (2 mg total) by mouth every 4 (four) hours as needed for severe pain. 30 tablet 0 08/17/2021  ? hydrOXYzine (ATARAX) 10 MG tablet Take 1 tablet  (10 mg total) by mouth every 6 (six) hours as needed for anxiety (sleep). 30 tablet 0 08/15/2021  ? ?Review of Systems  ?Constitutional: Negative.   ?Respiratory: Negative.    ?Cardiovascular: Negative.   ?Gastrointestinal: Negative.   ?Genitourinary: Negative.   ?Musculoskeletal: Negative.   ?Skin:  Positive for wound.  ?     Incision open  ?Neurological:  Positive for headaches.  ? ?Physical Exam  ? ?Patient Vitals for the past 24 hrs: ? BP Temp Temp src Pulse Resp SpO2  ?08/17/21 1615 137/89 98.4 ?F (36.9 ?C) Oral 83 18 100 %  ?08/17/21 1600 (!) 151/95 -- -- 85 -- 100 %  ?08/17/21 1545 (!) 135/91 -- -- 86 -- 100 %  ?08/17/21 1515 (!) 139/94 -- -- 86 -- 100 %  ?08/17/21 1500 (!) 140/100 -- -- 92 -- 100 %  ?08/17/21 1459 (!) 140/93 -- -- 96 -- --  ?08/17/21 1442 (!) 135/94 99 ?F (37.2 ?C) Oral 99 17 100 %  ? ?Physical Exam ?Vitals and nursing note reviewed.  ?Constitutional:   ?   General: She is not in acute distress. ?Eyes:  ?   Pupils: Pupils are equal, round, and reactive to light.  ?Cardiovascular:  ?   Rate and Rhythm: Normal rate.  ?Pulmonary:  ?   Effort: Pulmonary effort is normal.  ?Abdominal:  ?   Palpations: Abdomen is soft.  ?   Tenderness: There is no abdominal tenderness. There is no guarding.  ?   Comments: Incision open superficially, no erythema, no tenderness, no drainage/bleeding. Probed and no deeper than end of q-tip, no tracking.   ?Musculoskeletal:     ?   General: Normal range of motion.  ?   Cervical back: Normal range of motion.  ?Skin: ?   General: Skin is warm and dry.  ?Neurological:  ?   General: No focal deficit present.  ?   Mental Status: She is alert and oriented to person, place, and time.  ?Psychiatric:     ?   Mood and Affect: Mood normal.     ?   Behavior: Behavior normal.     ?   Thought Content: Thought content normal.     ?   Judgment: Judgment normal.  ? ?MAU Course  ?Procedures ? ?MDM ?BP's mild range. CBC, CMP unremarkable.  ?She was given 1g Tylenol PO for headache. Dr.  Duncan at bedside to assess incision. Steri-strips removed, wound culture collected although have a low suspicion for surgical site infection as there is no erythema, drainage, odor. No tracking within the wound. New steri-strips placed. After reviewing CMP, '400mg'$  Ibuprofen was ordered for incisional pain.  ?On reassessment, headache has completely resolved after medication.  ?Will start patient on Procardia '30mg'$  daily. Patient will have wound check and BP check in office next week. Patient requesting refill on pain meds. Will give a 3 day refill as well as send prescription for ibuprofen to pharmacy now that BUN and creatinine are within normal limits. Instructed patient to use narcotics for severe pain and to start incorporating ibuprofen. Reviewed strict return precautions ? ?Assessment and Plan  ?Postpartum Hypertension ? ?- Discharge home in stable condition ?- Rx for Procardia, Ibuprofen, and Dilaudid sent to pharmacy ?- Message sent to [facility] for BP and wound check  ?- Strict return precautions reviewed ?- Return to MAU sooner or as needed for worsening symptoms ? ? ? ?Renee Harder, CNM ?08/17/2021, 5:04 PM  ?

## 2021-08-19 LAB — AEROBIC CULTURE W GRAM STAIN (SUPERFICIAL SPECIMEN): Special Requests: NORMAL

## 2021-08-20 ENCOUNTER — Telehealth: Payer: Self-pay

## 2021-08-20 DIAGNOSIS — T8149XA Infection following a procedure, other surgical site, initial encounter: Secondary | ICD-10-CM

## 2021-08-20 MED ORDER — CIPROFLOXACIN HCL 500 MG PO TABS: 500.0000 mg | ORAL_TABLET | Freq: Two times a day (BID) | ORAL | 0 refills | Status: AC

## 2021-08-20 NOTE — Telephone Encounter (Signed)
Consulted with Dr. Damita Dunnings regarding wound culture results. Recommends cipro x7 days due to patient's high risk status. ? ?Attempted to call patient regarding wound culture results. No answer. RX sent to pharmacy. Patient has appointment on 5/16 and can discuss then if no return phone call to CNM. MyChart message sent. ? ?Wende Mott, CNM ?08/20/21 ?8:52 AM ? ?

## 2021-08-23 ENCOUNTER — Ambulatory Visit (INDEPENDENT_AMBULATORY_CARE_PROVIDER_SITE_OTHER): Payer: Medicaid Other

## 2021-08-23 VITALS — BP 122/80 | Wt 172.4 lb

## 2021-08-23 DIAGNOSIS — Z5189 Encounter for other specified aftercare: Secondary | ICD-10-CM

## 2021-08-23 DIAGNOSIS — R52 Pain, unspecified: Secondary | ICD-10-CM

## 2021-08-23 DIAGNOSIS — Z0130 Encounter for examination of blood pressure without abnormal findings: Secondary | ICD-10-CM

## 2021-08-23 DIAGNOSIS — O9089 Other complications of the puerperium, not elsewhere classified: Secondary | ICD-10-CM

## 2021-08-23 MED ORDER — LIDOCAINE 5 % EX PTCH: 1.0000 | MEDICATED_PATCH | CUTANEOUS | 0 refills | Status: DC

## 2021-08-23 MED ORDER — ACETAMINOPHEN 500 MG PO TABS: 1000.0000 mg | ORAL_TABLET | Freq: Four times a day (QID) | ORAL | 0 refills | Status: AC | PRN

## 2021-08-23 NOTE — Progress Notes (Signed)
Blood Pressure and Incision Check Visit  Ronesha Heenan is here for blood pressure and incision check following vacuum assisted vaginal delivery on 2/52/71 with complications of postpartum hemorrhage, DIC, and exploratory laparotomy with hysterectomy. Patient was in office on 08/17/21 for wound check of midline incision with RN and sent to MAU for pre-e evaluation due to elevated BP. Pt was diagnosed with postpartum hypertension and placed on nifedipine 30 mg daily. BP today is 122/80. Reviewed with Clayton Lefort, MD who states patient may continue nifedipine as prescribed and return for PP visit on 09/07/21.  Soiled steri strips removed. Incision is fully intact except 2 small open areas with small amount of yellow drainage present. Pink, healing tissue is visible once drainage removed. No warmth or discoloration to surrounding area. Patient denies any increased pain at incision site or other s/s of infection. Reviewed good wound care. Pt given abdominal pads to place over incision following daily shower. Pt to contact office with any change.   Pt reports bilateral edema and pain in feet. Encouraged good hydration, elevating feet, and Epsom salt soak of feet and legs only for pain relief. Pt reports moderate lower back pain. Reports taking 2- 400 mg tablets of ibuprofen at a time. Reviewed with Dione Plover, MD who recommends Tylenol 1000 mg q6 hours. Pt to discontinue ibuprofen due to recent AKI and non-adherence to prescribed amount of ibuprofen. Provider also recommends lidocaine 5 % patch for pain relief. Reviewed recommendations with patient who is agreeable to plan.  Annabell Howells, RN 08/23/2021  5:18 PM

## 2021-09-01 ENCOUNTER — Other Ambulatory Visit: Payer: Self-pay

## 2021-09-06 ENCOUNTER — Ambulatory Visit: Payer: Medicaid Other | Admitting: Family Medicine

## 2021-09-07 ENCOUNTER — Ambulatory Visit (INDEPENDENT_AMBULATORY_CARE_PROVIDER_SITE_OTHER): Payer: Medicaid Other | Admitting: Obstetrics & Gynecology

## 2021-09-07 VITALS — BP 127/84 | HR 98 | Wt 175.0 lb

## 2021-09-07 DIAGNOSIS — M79671 Pain in right foot: Secondary | ICD-10-CM | POA: Diagnosis not present

## 2021-09-07 DIAGNOSIS — O165 Unspecified maternal hypertension, complicating the puerperium: Secondary | ICD-10-CM

## 2021-09-07 DIAGNOSIS — Z90710 Acquired absence of both cervix and uterus: Secondary | ICD-10-CM

## 2021-09-07 DIAGNOSIS — M79672 Pain in left foot: Secondary | ICD-10-CM

## 2021-09-07 MED ORDER — GABAPENTIN 100 MG PO CAPS: 300.0000 mg | ORAL_CAPSULE | Freq: Two times a day (BID) | ORAL | 1 refills | Status: AC

## 2021-09-07 NOTE — Progress Notes (Signed)
    [place] Partum Visit Note  Debra Bernard is a 36 y.o. [id number] female who presents for a postpartum visit. She is  5.2  weeks postpartum following a [facility] vaginal delivery, hemorrhage and PP hysterectomy, SCAH.  I have fully reviewed the prenatal and intrapartum course. The delivery was at 40 gestational weeks.  Anesthesia: epidural. Postpartum course has been notable for HTN and f/u for hysterectomy. Baby is doing well. Baby is feeding by both breast and bottle - Fawn Kirk . Bleeding no bleeding. Bowel function is normal. Bladder function is normal. Patient is not sexually active. Contraception method is none. Postpartum depression screening: negative.   The pregnancy intention screening data noted above was reviewed. Potential methods of contraception were discussed. The patient elected to proceed with No data recorded.    Health Maintenance Due  Topic Date Due   COVID-19 Vaccine (2 - Booster for Janssen series) 10/11/2020    The following portions of the patient's history were reviewed and updated as appropriate: allergies, current medications, past family history, past medical history, past social history, past surgical history, and problem list.  Review of Systems Burning pain top of both feet  Objective:  There were no vitals taken for this visit.   General:  alert, cooperative, and no distress   Breasts:  not indicated  Lungs: Effor tnl  Heart:  regular rate and rhythm  Abdomen: soft, non-tender; bowel sounds normal; no masses,  no organomegaly   Wound well approximated incision  GU exam:  not indicated       Assessment:    There are no diagnoses linked to this encounter.  6 week postop postpartum exam.  Foot discomfort with normal exam today. May stop her Procardia and f/u with family medicine Plan:   Essential components of care per ACOG recommendations:  1.  Mood and well being: Patient with negative depression screening today. Reviewed local resources for  support.  - Patient tobacco use? No.   - hx of drug use? No.    2. Infant care and feeding:  -Patient currently breastmilk feeding? Yes. Discussed returning to work and pumping.  -Social determinants of health ([place]) reviewed in [place]. No concerns  3. Sexuality, contraception and birth spacing - Patient does not want a pregnancy in the next year.  Desired family size is 3 children.  - Reviewed reproductive life planning. Reviewed contraceptive methods based on pt preferences and effectiveness.  Patient hadFemale Sterilization via hysterectomy.    4. Sleep and fatigue -Encouraged family/partner/community support of 4 hrs of uninterrupted sleep to help with mood and fatigue  5. Physical Recovery  - Discussed patients delivery and complications. She describes her labor as bad. - Patient had a Vaginal problems after delivery including hemorrhage and SCAH .  - Patient has urinary incontinence? No. - Patient is safe to resume physical and sexual activity  6.  Health Maintenance - HM due items addressed Yes - Last pap smear  Diagnosis  Date Value Ref Range Status  01/13/2021   Final   - Negative for intraepithelial lesion or malignancy (NILM)   Pap smear not done at today's visit.  -Breast Cancer screening indicated? No.   7. Chronic Disease/Pregnancy Condition follow up: Hypertension. D/C Procardia  - PCP follow up  Woodroe Mode, MD  [facility] [place] [place]

## 2021-09-07 NOTE — Progress Notes (Signed)
Patient here for postpartum care. She stated that she is feeling well and denies any vaginal bleeding. However, she complains of pain in both feet. She describes the pain as "burning" and has been that way since L&D.

## 2021-09-22 ENCOUNTER — Other Ambulatory Visit: Payer: Self-pay | Admitting: Obstetrics & Gynecology

## 2021-09-22 DIAGNOSIS — M79671 Pain in right foot: Secondary | ICD-10-CM

## 2021-09-25 ENCOUNTER — Other Ambulatory Visit: Payer: Self-pay | Admitting: Obstetrics & Gynecology

## 2021-09-25 DIAGNOSIS — M79671 Pain in right foot: Secondary | ICD-10-CM

## 2021-09-27 ENCOUNTER — Telehealth: Payer: Self-pay | Admitting: Lactation Services

## 2021-09-27 NOTE — Telephone Encounter (Signed)
Received a refill request for Gabapentin that was prescribed by Dr. Roselie Awkward on 5/31 at Post partum visit. Per Chart review, patient needs to follow up with PCP. Faxed Prescription refill request back to [facility] that patient needs to follow up with PCP.   Called patient and received message that call cannot be completed at this time. Per chart review, no PCP listed. Will send My Chart message.

## 2023-12-16 IMAGING — US US RENAL
1 series · 15 of 25 positions shown · non-contrast
Comparison: None.

CLINICAL DATA: Acute kidney injury

EXAM:
RENAL / URINARY TRACT ULTRASOUND COMPLETE

[Series 1: us renal · 15 of 67 slices shown]
[im 1/67]
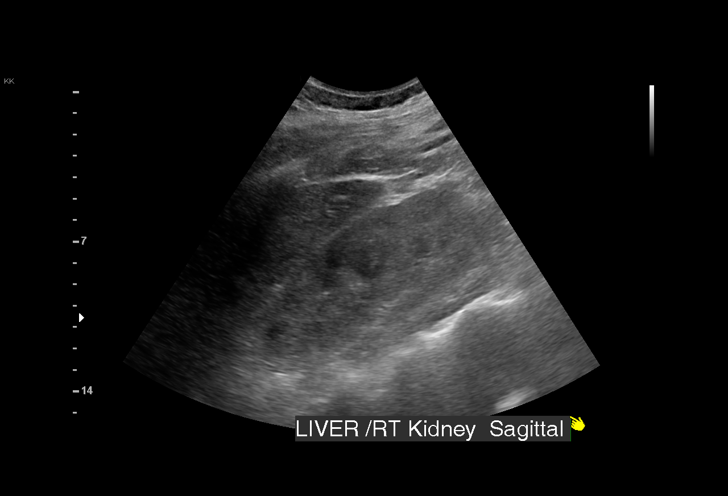
[im 6/67]
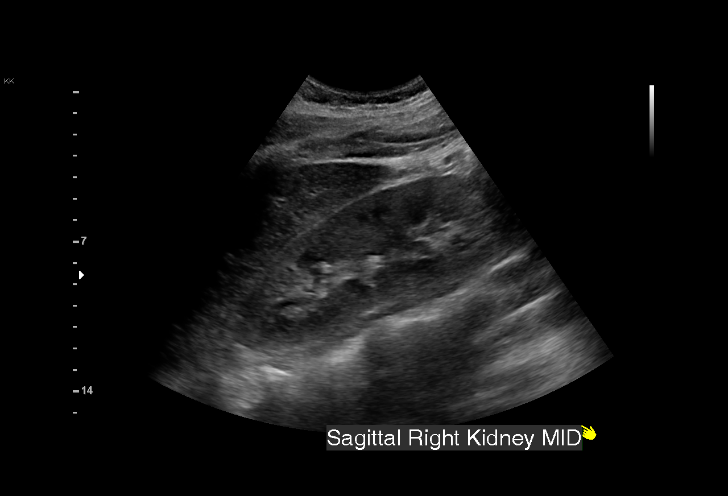
[im 12/67]
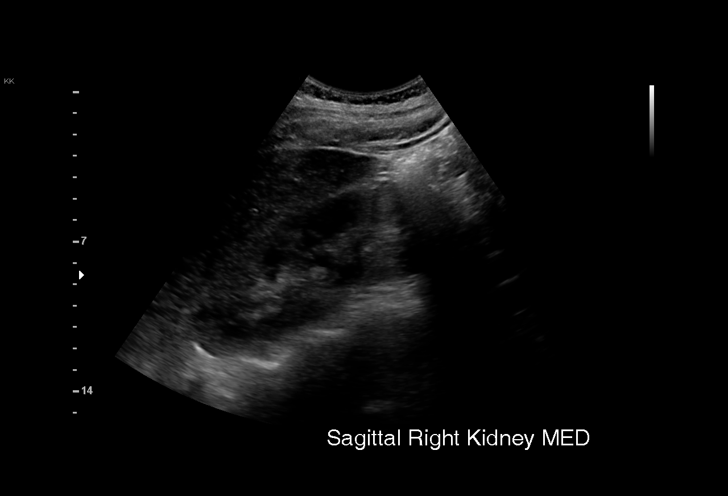
[im 14/67]
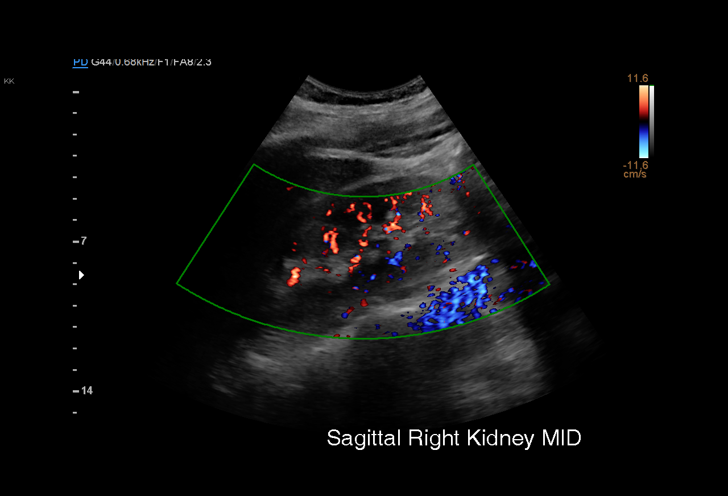
[im 20/67]
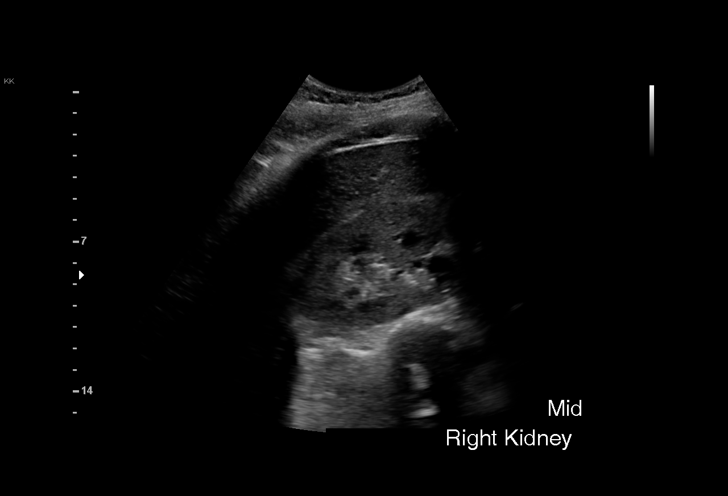
[im 25/67]
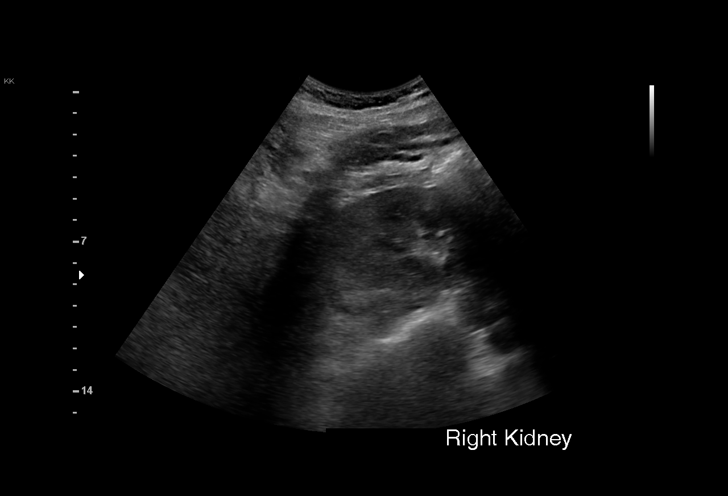
[im 28/67]
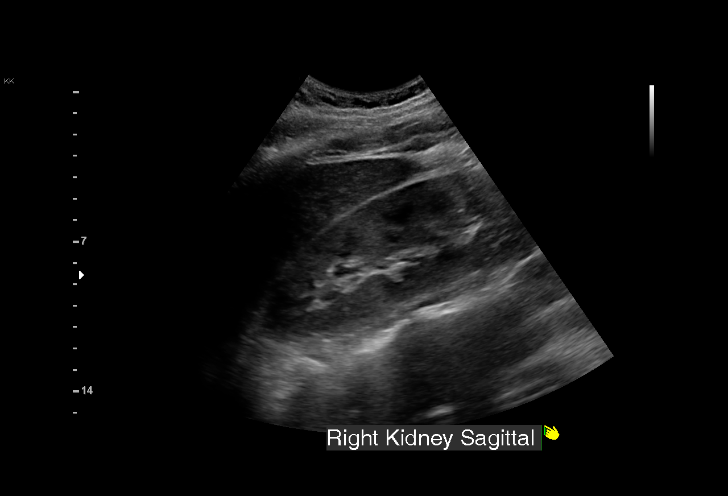
[im 34/67]
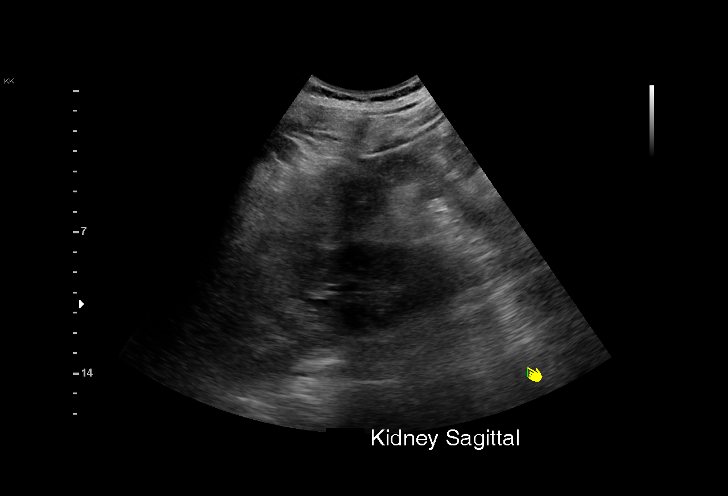
[im 39/67]
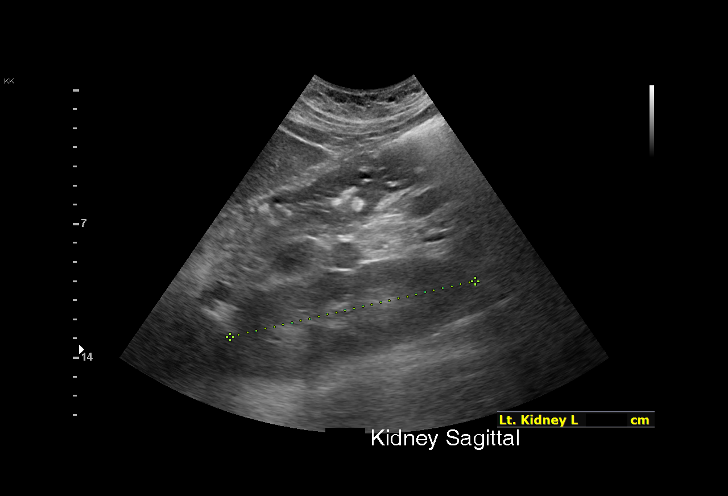
[im 42/67]
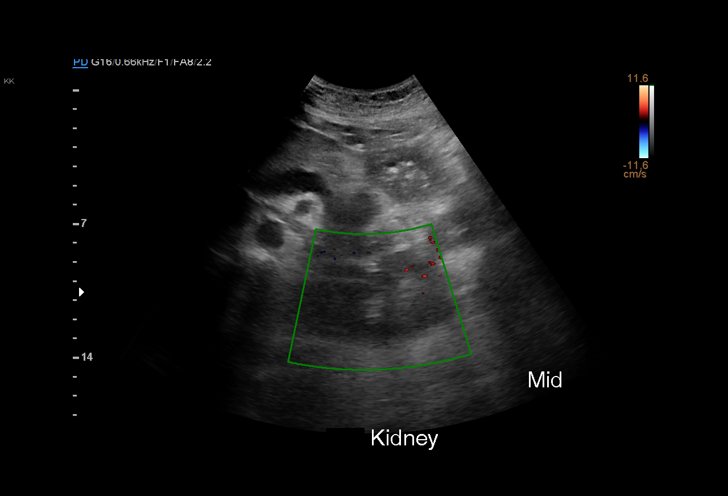
[im 47/67]
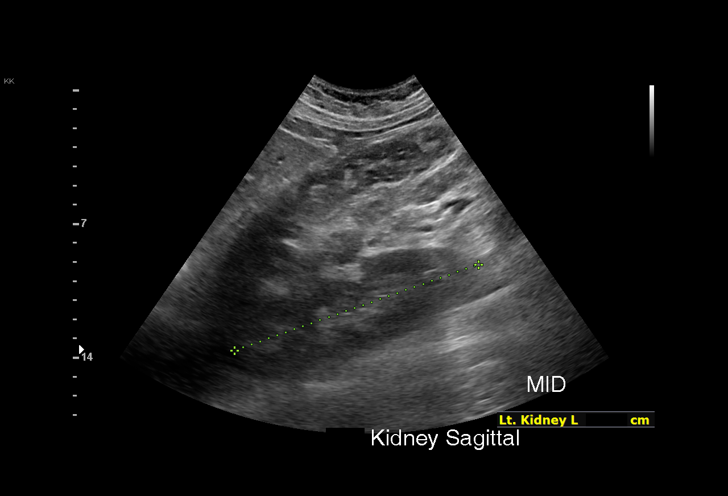
[im 53/67]
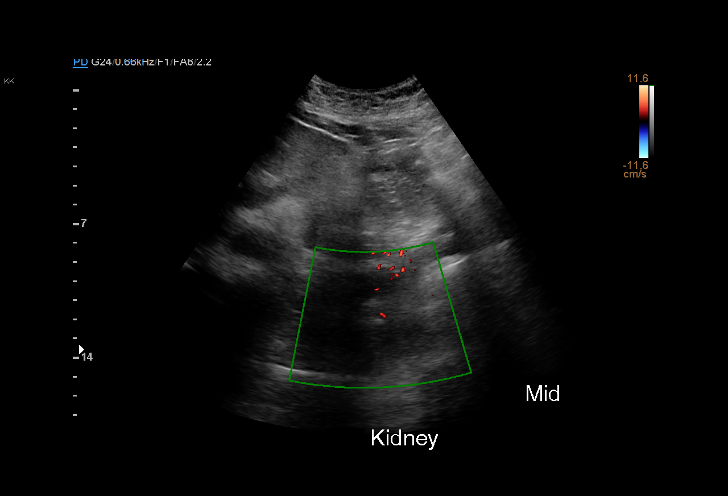
[im 56/67]
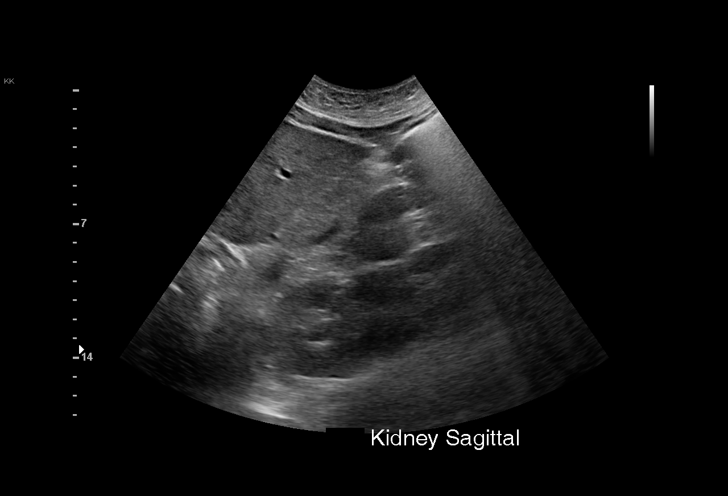
[im 61/67]
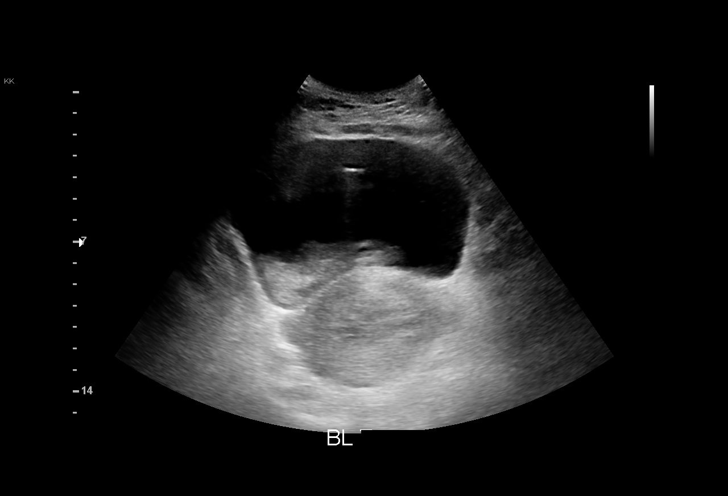
[im 67/67]
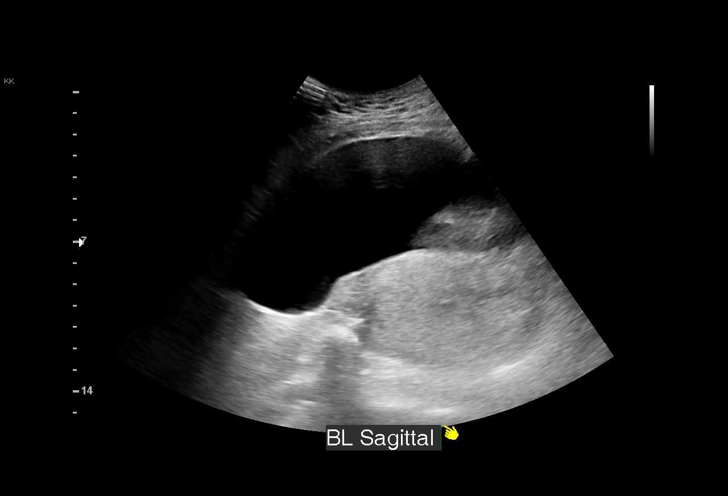

[15 of 25 positions shown; findings below may reference images not displayed]

FINDINGS: Right Kidney:

Renal measurements: 15.1 x 5.1 x 6.4 cm = volume: 258 mL.
Echogenicity within normal limits. No mass or hydronephrosis
visualized.

Left Kidney:

Renal measurements: 13.5 x 4.7 x 5.7 = volume: 188 mL. Echogenicity
within normal limits. No mass or hydronephrosis visualized.

Bladder:

Foley catheter in place. Debris layering within the urinary bladder.

Other:

None.
IMPRESSION: No acute findings.  No hydronephrosis.

Layering debris within the urinary bladder.

## 2023-12-19 IMAGING — CR DG CHEST 2V
2 series · 2 of 2 positions shown · non-contrast
Comparison: 08/03/2021

CLINICAL DATA: Postoperative fever

EXAM:
CHEST - 2 VIEW

[chest lat]
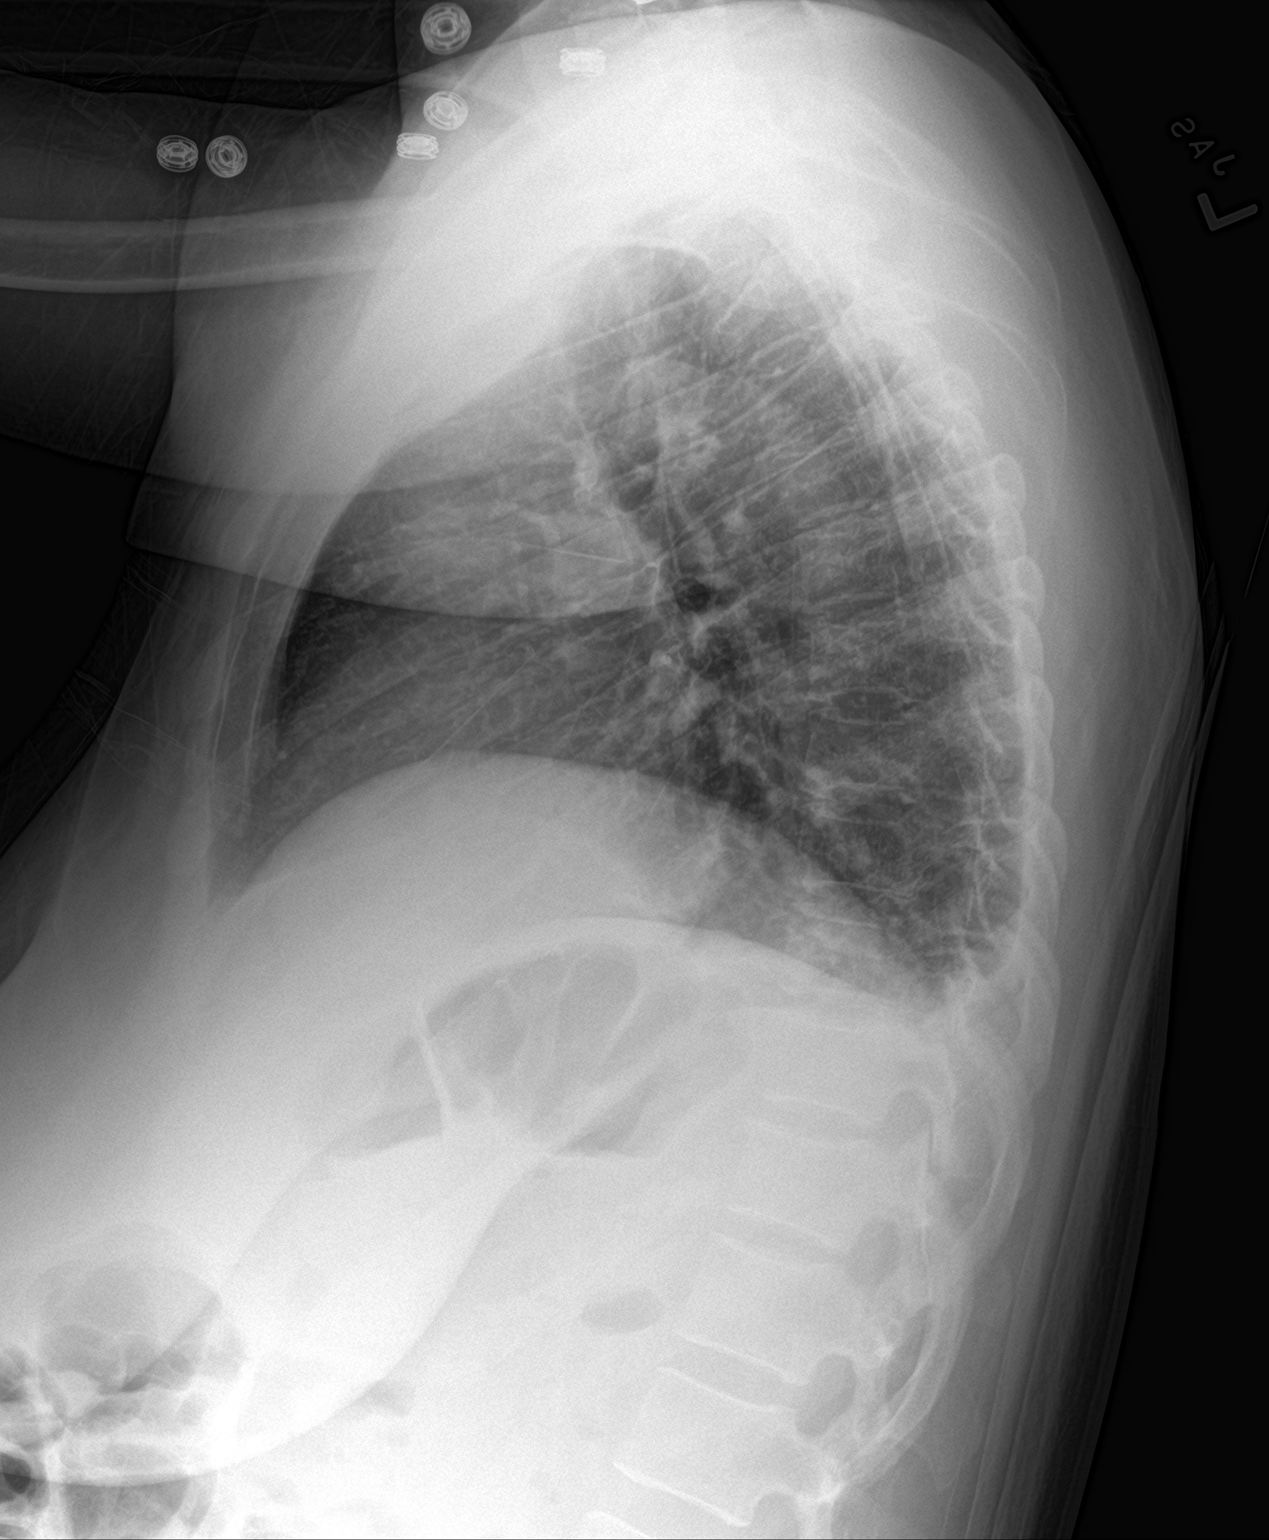

[chest ap]
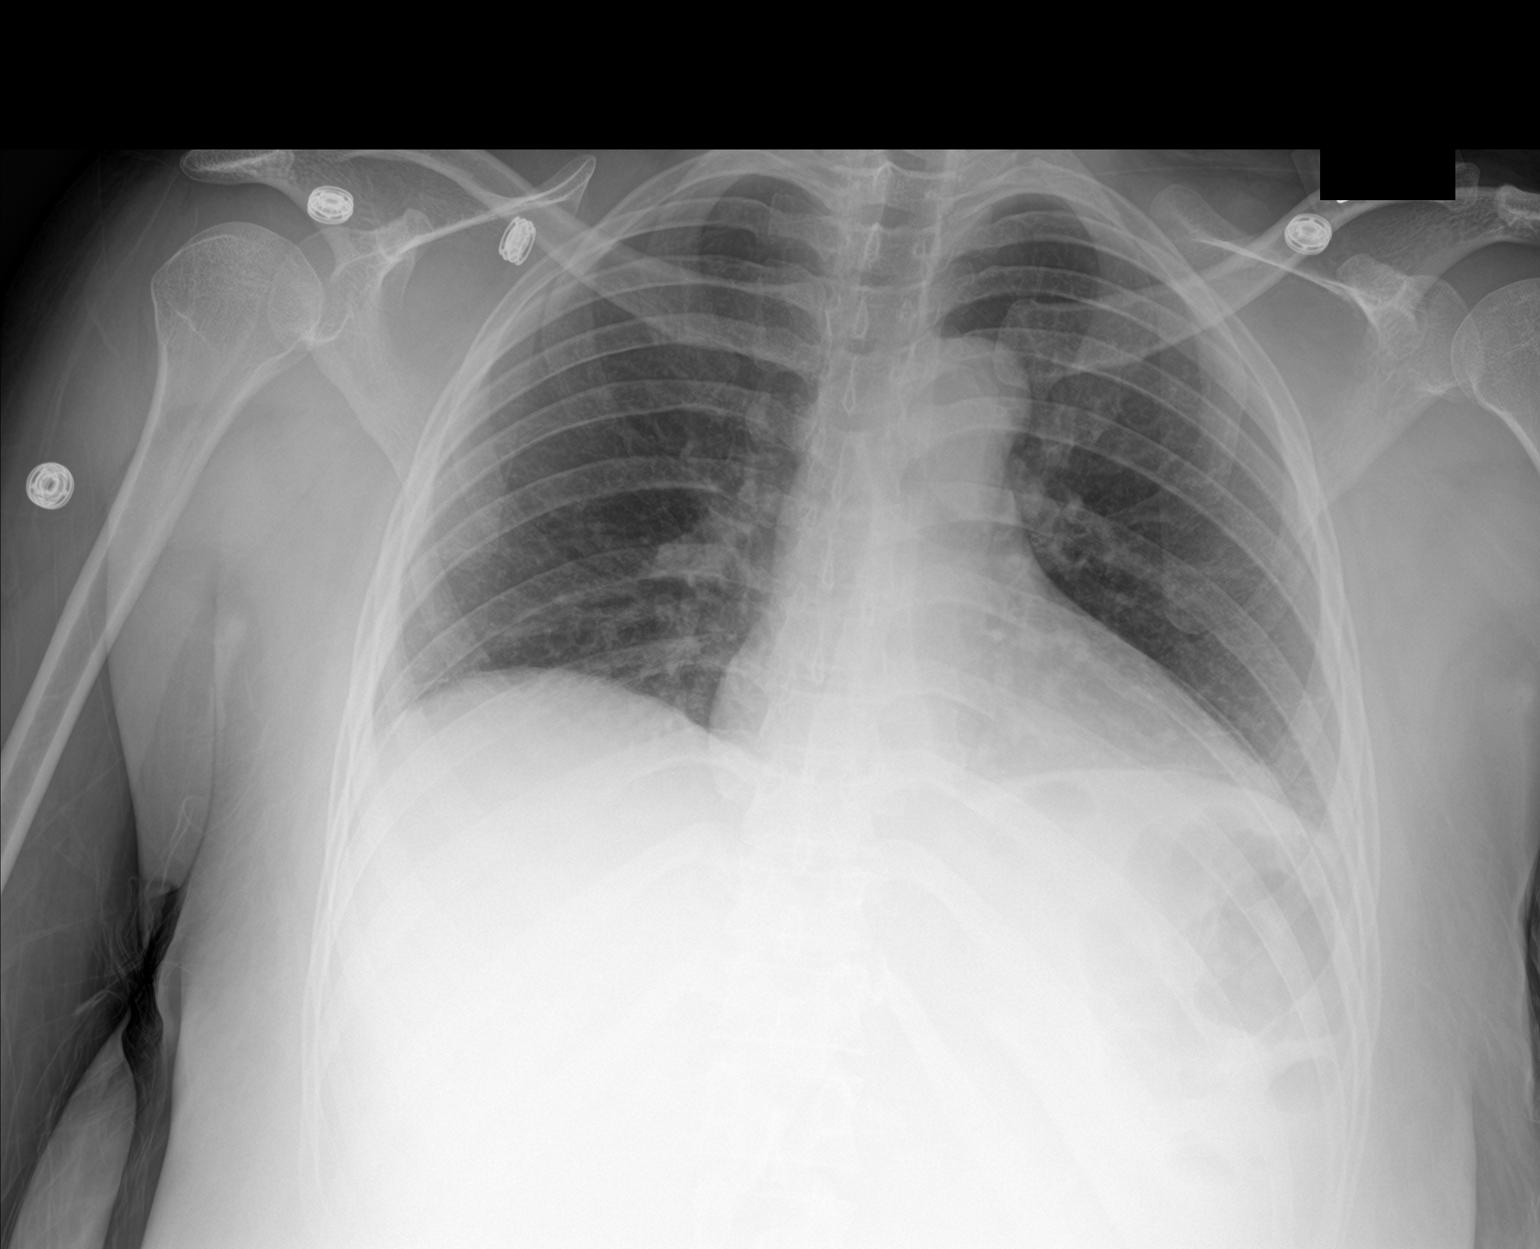

[2 of 2 positions shown; findings below may reference images not displayed]

FINDINGS: Endotracheal and enteric tubes as well as right IJ central venous
catheter have been removed. Normal heart size. Low lung volumes. No
focal airspace consolidation, pleural effusion, or pneumothorax.
IMPRESSION: Low lung volumes.  No active cardiopulmonary disease.
